# Patient Record
Sex: Female | Born: 1965 | Race: Black or African American | Hispanic: No | Marital: Single | Smoking: Never smoker
Health system: Southern US, Community
[De-identification: ages and names within clinical notes are randomized; demographics above are authoritative.]

## PROBLEM LIST (undated history)

## (undated) DIAGNOSIS — T7840XA Allergy, unspecified, initial encounter: Secondary | ICD-10-CM

## (undated) DIAGNOSIS — E119 Type 2 diabetes mellitus without complications: Secondary | ICD-10-CM

## (undated) DIAGNOSIS — G4730 Sleep apnea, unspecified: Secondary | ICD-10-CM

## (undated) DIAGNOSIS — I429 Cardiomyopathy, unspecified: Secondary | ICD-10-CM

## (undated) DIAGNOSIS — C801 Malignant (primary) neoplasm, unspecified: Secondary | ICD-10-CM

## (undated) DIAGNOSIS — G8929 Other chronic pain: Secondary | ICD-10-CM

## (undated) DIAGNOSIS — Z5189 Encounter for other specified aftercare: Secondary | ICD-10-CM

## (undated) DIAGNOSIS — G43909 Migraine, unspecified, not intractable, without status migrainosus: Secondary | ICD-10-CM

## (undated) DIAGNOSIS — R011 Cardiac murmur, unspecified: Secondary | ICD-10-CM

## (undated) DIAGNOSIS — K219 Gastro-esophageal reflux disease without esophagitis: Secondary | ICD-10-CM

## (undated) DIAGNOSIS — R001 Bradycardia, unspecified: Secondary | ICD-10-CM

## (undated) DIAGNOSIS — I509 Heart failure, unspecified: Secondary | ICD-10-CM

## (undated) DIAGNOSIS — D649 Anemia, unspecified: Secondary | ICD-10-CM

## (undated) DIAGNOSIS — E785 Hyperlipidemia, unspecified: Secondary | ICD-10-CM

## (undated) HISTORY — DX: Malignant (primary) neoplasm, unspecified: C80.1

## (undated) HISTORY — PX: LAPAROSCOPY ABDOMEN DIAGNOSTIC: PRO50

## (undated) HISTORY — DX: Allergy, unspecified, initial encounter: T78.40XA

## (undated) HISTORY — DX: Encounter for other specified aftercare: Z51.89

## (undated) HISTORY — DX: Gastro-esophageal reflux disease without esophagitis: K21.9

## (undated) HISTORY — PX: LEEP: SHX91

## (undated) HISTORY — DX: Other chronic pain: G89.29

## (undated) HISTORY — DX: Cardiac murmur, unspecified: R01.1

## (undated) HISTORY — DX: Anemia, unspecified: D64.9

## (undated) HISTORY — PX: HERNIA REPAIR: SHX51

## (undated) HISTORY — DX: Type 2 diabetes mellitus without complications: E11.9

## (undated) HISTORY — DX: Hyperlipidemia, unspecified: E78.5

---

## 1997-11-03 HISTORY — PX: TUBAL LIGATION: SHX77

## 2006-03-24 DIAGNOSIS — I341 Nonrheumatic mitral (valve) prolapse: Secondary | ICD-10-CM | POA: Insufficient documentation

## 2008-02-07 DIAGNOSIS — E66813 Obesity, class 3: Secondary | ICD-10-CM | POA: Insufficient documentation

## 2010-09-20 DIAGNOSIS — Z9189 Other specified personal risk factors, not elsewhere classified: Secondary | ICD-10-CM | POA: Insufficient documentation

## 2010-09-20 DIAGNOSIS — G43009 Migraine without aura, not intractable, without status migrainosus: Secondary | ICD-10-CM | POA: Insufficient documentation

## 2011-03-14 DIAGNOSIS — D75839 Thrombocytosis, unspecified: Secondary | ICD-10-CM | POA: Insufficient documentation

## 2011-03-14 DIAGNOSIS — D473 Essential (hemorrhagic) thrombocythemia: Secondary | ICD-10-CM | POA: Insufficient documentation

## 2011-06-10 DIAGNOSIS — G4733 Obstructive sleep apnea (adult) (pediatric): Secondary | ICD-10-CM | POA: Insufficient documentation

## 2011-06-10 DIAGNOSIS — Z9989 Dependence on other enabling machines and devices: Secondary | ICD-10-CM | POA: Insufficient documentation

## 2012-12-19 DIAGNOSIS — I5030 Unspecified diastolic (congestive) heart failure: Secondary | ICD-10-CM | POA: Insufficient documentation

## 2014-08-10 DIAGNOSIS — G8929 Other chronic pain: Secondary | ICD-10-CM | POA: Insufficient documentation

## 2014-08-10 DIAGNOSIS — M25512 Pain in left shoulder: Secondary | ICD-10-CM

## 2014-11-03 HISTORY — PX: PACEMAKER IMPLANT: EP1218

## 2014-11-03 HISTORY — PX: ABDOMINAL HYSTERECTOMY: SHX81

## 2015-05-23 DIAGNOSIS — I421 Obstructive hypertrophic cardiomyopathy: Secondary | ICD-10-CM

## 2015-05-23 DIAGNOSIS — I422 Other hypertrophic cardiomyopathy: Secondary | ICD-10-CM | POA: Insufficient documentation

## 2015-10-16 DIAGNOSIS — Z90710 Acquired absence of both cervix and uterus: Secondary | ICD-10-CM | POA: Insufficient documentation

## 2016-04-22 DIAGNOSIS — F32A Depression, unspecified: Secondary | ICD-10-CM | POA: Insufficient documentation

## 2016-04-22 DIAGNOSIS — F329 Major depressive disorder, single episode, unspecified: Secondary | ICD-10-CM | POA: Insufficient documentation

## 2016-06-20 DIAGNOSIS — Z950 Presence of cardiac pacemaker: Secondary | ICD-10-CM | POA: Insufficient documentation

## 2016-07-28 DIAGNOSIS — R7303 Prediabetes: Secondary | ICD-10-CM | POA: Insufficient documentation

## 2018-04-02 ENCOUNTER — Ambulatory Visit (HOSPITAL_COMMUNITY)
Admission: EM | Admit: 2018-04-02 | Discharge: 2018-04-02 | Disposition: A | Discharge status: Home / Self Care | Payer: Medicare Other | Attending: Family Medicine | Admitting: Family Medicine

## 2018-04-02 ENCOUNTER — Encounter (HOSPITAL_COMMUNITY): Payer: Self-pay | Admitting: Family Medicine

## 2018-04-02 DIAGNOSIS — M25512 Pain in left shoulder: Secondary | ICD-10-CM | POA: Diagnosis not present

## 2018-04-02 DIAGNOSIS — G8929 Other chronic pain: Secondary | ICD-10-CM | POA: Diagnosis not present

## 2018-04-02 DIAGNOSIS — M545 Low back pain, unspecified: Secondary | ICD-10-CM

## 2018-04-02 HISTORY — DX: Cardiomyopathy, unspecified: I42.9

## 2018-04-02 HISTORY — DX: Migraine, unspecified, not intractable, without status migrainosus: G43.909

## 2018-04-02 HISTORY — DX: Sleep apnea, unspecified: G47.30

## 2018-04-02 HISTORY — DX: Bradycardia, unspecified: R00.1

## 2018-04-02 HISTORY — DX: Heart failure, unspecified: I50.9

## 2018-04-02 MED ORDER — DICLOFENAC SODIUM 75 MG PO TBEC: 75.0000 mg | DELAYED_RELEASE_TABLET | Freq: Two times a day (BID) | ORAL | 0 refills | Status: DC

## 2018-04-02 MED ORDER — METHOCARBAMOL 750 MG PO TABS: 750.0000 mg | ORAL_TABLET | Freq: Four times a day (QID) | ORAL | 0 refills | Status: DC

## 2018-04-02 MED ORDER — PREDNISONE 50 MG PO TABS: 50.0000 mg | ORAL_TABLET | Freq: Every day | ORAL | 0 refills | Status: AC

## 2018-04-02 NOTE — Discharge Instructions (Signed)
Please begin diclofenac twice daily, this is an anti-inflammatory and should help with your pain  Please take prednisone daily for the next 5 days with breakfast, this is a steroid and should also help with inflammation in your shoulder and back  Please use Robaxin muscle relaxer as needed to help with back pain.  Please follow-up with orthopedics

## 2018-04-02 NOTE — ED Triage Notes (Signed)
Pt with hx of chronic left shoulder pain here for worseing left shoulder pain x 1 week with radiation into arm and hand. She is also having mid to lower back pain and left leg pain. She has been taking tylenol without relief.

## 2018-04-02 NOTE — ED Provider Notes (Signed)
[place]    CSN: [id number] Arrival date & time: 04/02/18  1533     History   Chief Complaint Chief Complaint  Patient presents with  . Arm Pain  . Leg Pain  . Back Pain    HPI Sophia Wilkinson is a 52 y.o. female history of CHF, cardiomyopathy, sleep apnea presenting today for evaluation of left shoulder pain as well as left lower back pain that is radiating into her left leg.  Patient states that she has had chronic left shoulder pain as she is previously had a rotator cuff tear which she did not have operatively repaired.  She has tried physical therapy which did not provide her with much improvement.  She states that over the past week her pain has worsened and has also developed some left lower back pain.  Taken Tylenol without relief.  Denies numbness or tingling.  Denies any new injury or any new increase in activity.  Denies loss of bowel or bladder control, denies saddle anesthesia.  HPI  Past Medical History:  Diagnosis Date  . Bradycardia   . Cardiomyopathy ([place])   . CHF (congestive heart failure) ([place])   . Migraines   . Sleep apnea     There are no active problems to display for this patient.   Past Surgical History:  Procedure Laterality Date  . ABDOMINAL HYSTERECTOMY    . HERNIA REPAIR    . PACEMAKER IMPLANT    . TUBAL LIGATION      OB History   None      Home Medications    Prior to Admission medications   Medication Sig Start Date End Date Taking? Authorizing Provider  ALPRAZolam Duanne Moron) 0.25 MG tablet Take 0.25 mg by mouth at bedtime as needed for anxiety.   Yes [provider]  candesartan (ATACAND) 4 MG tablet Take 2 mg by mouth daily.   Yes [provider]  fluticasone (FLONASE) 50 MCG/ACT nasal spray Place into both nostrils daily.   Yes [provider]  loratadine (CLARITIN) 10 MG tablet Take 10 mg by mouth daily.   Yes [provider]  metoprolol succinate (TOPROL-XL) 25 MG 24 hr tablet  Take 75 mg by mouth daily.    Yes [provider]  potassium chloride (K-DUR,KLOR-CON) 10 MEQ tablet Take 10 mEq by mouth 2 (two) times daily.   Yes [provider]  spironolactone (ALDACTONE) 25 MG tablet Take 12.5 mg by mouth daily.   Yes [provider]  torsemide (DEMADEX) 10 MG tablet Take 10 mg by mouth daily.   Yes [provider]  diclofenac (VOLTAREN) 75 MG EC tablet Take 1 tablet (75 mg total) by mouth 2 (two) times daily. 04/02/18   Surie Suchocki C, PA-C  methocarbamol (ROBAXIN-750) 750 MG tablet Take 1 tablet (750 mg total) by mouth 4 (four) times daily. 04/02/18   Elyana Grabski C, PA-C  predniSONE (DELTASONE) 50 MG tablet Take 1 tablet (50 mg total) by mouth daily for 5 days. 04/02/18 04/07/18  Ryonna Cimini, Elesa Hacker, PA-C    Family History History reviewed. No pertinent family history.  Social History Social History   Tobacco Use  . Smoking status: Never Smoker  . Smokeless tobacco: Former Network engineer Use Topics  . Alcohol use: Not on file  . Drug use: Not on file     Allergies   Patient has no known allergies.   Review of Systems Review of Systems  Constitutional: Negative for activity change and  appetite change.  Eyes: Negative for pain and visual disturbance.  Respiratory: Negative for shortness of breath.   Cardiovascular: Negative for chest pain.  Gastrointestinal: Negative for abdominal pain, nausea and vomiting.  Musculoskeletal: Positive for arthralgias, back pain and myalgias. Negative for gait problem, neck pain and neck stiffness.  Skin: Negative for color change and wound.  Neurological: Negative for dizziness, seizures, syncope, weakness, light-headedness, numbness and headaches.     Physical Exam Triage Vital Signs ED Triage Vitals  Enc Vitals Group     BP 04/02/18 1621 112/64     Pulse Rate 04/02/18 1621 61     Resp 04/02/18 1621 18     Temp --      Temp src --      SpO2 04/02/18 1621 98 %     Weight --       Height --      Head Circumference --      Peak Flow --      Pain Score 04/02/18 1618 8     Pain Loc --      Pain Edu? --      Excl. in [place]? --    No data found.  Updated Vital Signs BP 112/64   Pulse 61   Resp 18   SpO2 98%   Visual Acuity Right Eye Distance:   Left Eye Distance:   Bilateral Distance:    Right Eye Near:   Left Eye Near:    Bilateral Near:     Physical Exam  Constitutional: She appears well-developed and well-nourished. No distress.  Overweight  HENT:  Head: Normocephalic and atraumatic.  Eyes: Conjunctivae are normal.  Neck: Neck supple.  Cardiovascular: Normal rate.  Pulmonary/Chest: Effort normal. No respiratory distress.  Musculoskeletal: She exhibits no edema.  Patient able to ambulate from chair to exam table without assistance and with minimal abnormality  No obvious deformity or swelling to left shoulder, patient unable to perform internal rotation.  Limited active range of motion beyond 90 degrees to to pain, slightly increased range of motion with passive, but patient does resist beyond 90 degrees.  Weakness associated with empty can, [place], resisted external rotation.  Nontender to palpation of cervical, thoracic and lumbar spine, tenderness to palpation of left lateral lumbar musculature, negative straight leg raise.  Neurological: She is alert.  Skin: Skin is warm and dry.  Psychiatric: She has a normal mood and affect.  Nursing note and vitals reviewed.    UC Treatments / Results  Labs (all labs ordered are listed, but only abnormal results are displayed) Labs Reviewed - No data to display  EKG None  Radiology No results found.  Procedures Procedures (including critical care time)  Medications Ordered in UC Medications - No data to display  Initial Impression / Assessment and Plan / UC Course  I have reviewed the triage vital signs and the nursing notes.  Pertinent labs & imaging results that were available during  my care of the patient were reviewed by me and considered in my medical decision making (see chart for details).     Patient likely with acute on chronic shoulder pain, secondary to rotator cuff tear.  Back pain also appears muscular, no red flags.  Will provide diclofenac, prednisone as well as Robaxin.  And follow-up with orthopedics.Discussed strict return precautions. Patient verbalized understanding and is agreeable with plan.  Final Clinical Impressions(s) / UC Diagnoses   Final diagnoses:  Chronic left shoulder pain  Acute right-sided low back  pain without sciatica     Discharge Instructions     Please begin diclofenac twice daily, this is an anti-inflammatory and should help with your pain  Please take prednisone daily for the next 5 days with breakfast, this is a steroid and should also help with inflammation in your shoulder and back  Please use Robaxin muscle relaxer as needed to help with back pain.  Please follow-up with orthopedics   ED Prescriptions    Medication Sig Dispense Auth. Provider   diclofenac (VOLTAREN) 75 MG EC tablet Take 1 tablet (75 mg total) by mouth 2 (two) times daily. 30 tablet Randilyn Foisy C, PA-C   predniSONE (DELTASONE) 50 MG tablet Take 1 tablet (50 mg total) by mouth daily for 5 days. 5 tablet Cristofer Yaffe C, PA-C   methocarbamol (ROBAXIN-750) 750 MG tablet Take 1 tablet (750 mg total) by mouth 4 (four) times daily. 30 tablet Manuel Lawhead, [place] C, PA-C     Controlled Substance Prescriptions [place] Controlled Substance Registry consulted? Not Applicable   Janith Lima, [place] 04/02/18 2040

## 2018-04-06 ENCOUNTER — Encounter: Payer: Self-pay | Admitting: Family Medicine

## 2018-04-13 NOTE — Progress Notes (Signed)
Subjective:   Patient ID: Sophia Wilkinson    DOB: 09-03-1966, 52 y.o. female   MRN: [id number]  Lilya Smitherman is a 52 y.o. female with a history of sleep apnea, migraines, CHF, chronic pain here for   Establish Care - [place] and medications reviewed - CHF - candesartan, metoprolol, spironolactone, Kdur (dose dependent on torsemide dose that day), torsemide 10-20mg . Sees cardiologist in [place], needs to establish down here. 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 HCM (2011). No stents, no MI that she knows of. - allergies - claritin - sleep apnea with CPAP, needs supplies. Last sleep study 2012. - migraines, no chronic meds. - Surg Hx - hysterectomy 2016, BTL, pacemaker 2016, hernia repair x4, diagnostic laps - lives with oldest daughter, granddaughter, grandson. On disability, does not work outside the home. No alcohol, never smoker, no ID use. - needs pacemaker interogated in August - OB/GYN hx - G7P5, all vaginal deliveries.  - seen in the ED 5/31 for L shoulder and L lower back pain. Chronic. H/o rotator cuff tear with no surgical fixation. Received diclofenac, prednisone, and robaxin. F/u with ortho.  Review of Systems:  Per HPI.   [place], medications and smoking status reviewed.  Objective:   BP 110/70   Pulse 61   Temp 98.7 F (37.1 C)   Ht 5\' 6"  (1.676 m)   Wt 276 lb 3.2 oz (125.3 kg)   SpO2 99%   BMI 44.58 kg/m  Vitals and nursing note reviewed.  General: well nourished, well developed, in no acute distress with non-toxic appearance HEENT: normocephalic, atraumatic, moist mucous membranes.  Neck: Acanthosis nigricans  CV: regular rate and rhythm without murmurs, rubs, or gallops, 1+ pitting lower extremity edema Lungs: clear to auscultation bilaterally with normal work of breathing Skin: warm, dry, no rashes or lesions Extremities: warm and well perfused, normal tone MSK: ROM grossly intact, strength intact, gait normal Neuro: Alert and oriented, speech  normal  Assessment & Plan:   Chronic congestive heart failure ([place]) Well managed by cardiologist in [place]. Needs to establish with local cardiologist since moving a few months ago. Will place referral. Will also need to see EP for her pacemaker. Will check BMP today. Refills given.  Obesity Contributing to multiple comorbidities including sleep apnea, CHF, multiple abdominal hernias. Consider nutrition referral at subsequent visit. Acanthosis nigricans visible on exam today, will check A1c.   Healthcare Maintenance Referral placed for colonoscopy. Mammogram recently done in [place], will obtain records. Advised patient to make appointment for well woman visit to update pap smear. Obtain Hep C and HIV screening today.  Orders Placed This Encounter  Procedures  . Basic Metabolic Panel  . Hepatitis c antibody (reflex)  . Ambulatory referral to Cardiology    Referral Priority:   Routine    Referral Type:   Consultation    Referral Reason:   Specialty Services Required    Requested Specialty:   Cardiology    Number of Visits Requested:   1  . Ambulatory referral to Gastroenterology    Referral Priority:   Routine    Referral Type:   Consultation    Referral Reason:   Specialty Services Required    Number of Visits Requested:   1  . POCT glycosylated hemoglobin (Hb A1C)  . Split night study    Standing Status:   Future    Standing Expiration Date:   04/15/2019    Order Specific Question:   Where should this test be performed:    Answer:  [facility]   Meds ordered this encounter  Medications  . candesartan (ATACAND) 4 MG tablet    Sig: Take 0.5 tablets (2 mg total) by mouth daily.    Dispense:  45 tablet    Refill:  1  . loratadine (CLARITIN) 10 MG tablet    Sig: Take 1 tablet (10 mg total) by mouth daily.    Dispense:  90 tablet    Refill:  1  . metoprolol succinate (TOPROL-XL) 25 MG 24 hr tablet    Sig: Take 3 tablets (75 mg total) by mouth daily.     Dispense:  270 tablet    Refill:  1  . potassium chloride (K-DUR,KLOR-CON) 10 MEQ tablet    Sig: Take 1 tablet (10 mEq total) by mouth 2 (two) times daily.    Dispense:  180 tablet    Refill:  0  . spironolactone (ALDACTONE) 25 MG tablet    Sig: Take 0.5 tablets (12.5 mg total) by mouth daily.    Dispense:  45 tablet    Refill:  1  . torsemide (DEMADEX) 10 MG tablet    Sig: Take 1 tablet (10 mg total) by mouth daily.    Dispense:  90 tablet    Refill:  [place], DO PGY-1, [facility] 04/14/2018 2:57 PM

## 2018-04-14 ENCOUNTER — Encounter: Payer: Self-pay | Admitting: Family Medicine

## 2018-04-14 ENCOUNTER — Ambulatory Visit (INDEPENDENT_AMBULATORY_CARE_PROVIDER_SITE_OTHER): Payer: Medicare Other | Admitting: Family Medicine

## 2018-04-14 VITALS — BP 110/70 | HR 61 | Temp 98.7°F | Ht 66.0 in | Wt 276.2 lb

## 2018-04-14 DIAGNOSIS — Z114 Encounter for screening for human immunodeficiency virus [HIV]: Secondary | ICD-10-CM

## 2018-04-14 DIAGNOSIS — E669 Obesity, unspecified: Secondary | ICD-10-CM | POA: Diagnosis not present

## 2018-04-14 DIAGNOSIS — R739 Hyperglycemia, unspecified: Secondary | ICD-10-CM | POA: Diagnosis not present

## 2018-04-14 DIAGNOSIS — Z0000 Encounter for general adult medical examination without abnormal findings: Secondary | ICD-10-CM

## 2018-04-14 DIAGNOSIS — I509 Heart failure, unspecified: Secondary | ICD-10-CM | POA: Diagnosis not present

## 2018-04-14 LAB — POCT GLYCOSYLATED HEMOGLOBIN (HGB A1C): HEMOGLOBIN A1C: 6.1 % — AB (ref 4.0–5.6)

## 2018-04-14 MED ORDER — CANDESARTAN CILEXETIL 4 MG PO TABS: 2.0000 mg | ORAL_TABLET | Freq: Every day | ORAL | 1 refills | Status: DC

## 2018-04-14 MED ORDER — METOPROLOL SUCCINATE ER 25 MG PO TB24: 75.0000 mg | ORAL_TABLET | Freq: Every day | ORAL | 1 refills | Status: DC

## 2018-04-14 MED ORDER — TORSEMIDE 10 MG PO TABS: 10.0000 mg | ORAL_TABLET | Freq: Every day | ORAL | 1 refills | Status: DC

## 2018-04-14 MED ORDER — SPIRONOLACTONE 25 MG PO TABS: 12.5000 mg | ORAL_TABLET | Freq: Every day | ORAL | 1 refills | Status: DC

## 2018-04-14 MED ORDER — POTASSIUM CHLORIDE CRYS ER 10 MEQ PO TBCR: 10.0000 meq | EXTENDED_RELEASE_TABLET | Freq: Two times a day (BID) | ORAL | 0 refills | Status: DC

## 2018-04-14 MED ORDER — LORATADINE 10 MG PO TABS: 10.0000 mg | ORAL_TABLET | Freq: Every day | ORAL | 1 refills | Status: DC

## 2018-04-14 NOTE — Patient Instructions (Addendum)
It was great to see you!  Our plans for today: - referral to Cardiology  - refill medications - referral for colonoscopy - obtain records for mammogram - make appointment for well woman exam  We are checking some labs today, we will call you or send you a letter if they are abnormal.   Take care and seek immediate care sooner if you develop any concerns.   Dr. [facility]

## 2018-04-14 NOTE — Assessment & Plan Note (Signed)
Contributing to multiple comorbidities including sleep apnea, CHF, multiple abdominal hernias. Consider nutrition referral at subsequent visit. Acanthosis nigricans visible on exam today, will check A1c.

## 2018-04-14 NOTE — Assessment & Plan Note (Addendum)
Well managed by cardiologist in [place]. Needs to establish with local cardiologist since moving a few months ago. Will place referral. Will also need to see EP for her pacemaker. Will check BMP today. Refills given.

## 2018-04-15 LAB — BASIC METABOLIC PANEL
BUN/Creatinine Ratio: 14 (ref 9–23)
BUN: 11 mg/dL (ref 6–24)
CALCIUM: 9.4 mg/dL (ref 8.7–10.2)
CHLORIDE: 103 mmol/L (ref 96–106)
CO2: 23 mmol/L (ref 20–29)
Creatinine, Ser: 0.8 mg/dL (ref 0.57–1.00)
GFR calc non Af Amer: 86 mL/min/{1.73_m2} (ref >59)
GFR, EST AFRICAN AMERICAN: 99 mL/min/{1.73_m2} (ref >59)
Glucose: 92 mg/dL (ref 65–99)
POTASSIUM: 4.3 mmol/L (ref 3.5–5.2)
SODIUM: 140 mmol/L (ref 134–144)

## 2018-04-15 LAB — HCV COMMENT:

## 2018-04-15 LAB — HIV ANTIBODY (ROUTINE TESTING W REFLEX): HIV SCREEN 4TH GENERATION: NONREACTIVE

## 2018-04-15 LAB — HEPATITIS C ANTIBODY (REFLEX): HCV Ab: <0.1 s/co ratio (ref 0.0–0.9)

## 2018-04-16 ENCOUNTER — Encounter: Payer: Self-pay | Admitting: Gastroenterology

## 2018-04-28 ENCOUNTER — Ambulatory Visit: Payer: Medicare Other | Admitting: Family Medicine

## 2018-05-03 DIAGNOSIS — M25512 Pain in left shoulder: Secondary | ICD-10-CM | POA: Insufficient documentation

## 2018-05-11 ENCOUNTER — Telehealth: Payer: Self-pay | Admitting: Family Medicine

## 2018-05-11 ENCOUNTER — Other Ambulatory Visit: Payer: Self-pay | Admitting: Family Medicine

## 2018-05-11 DIAGNOSIS — M25512 Pain in left shoulder: Principal | ICD-10-CM

## 2018-05-11 DIAGNOSIS — G8929 Other chronic pain: Secondary | ICD-10-CM

## 2018-05-11 NOTE — Telephone Encounter (Signed)
Referral placed.

## 2018-05-11 NOTE — Telephone Encounter (Signed)
Contacted pt to see about her PT questions. Pt stated she was was seen by ortho for left shoulder pain and was referred to PT. Pts insurance needs this PT referral to come from her pcp, not her ortho doctor. Pt is new to the area and doesn't have a preference on PT. Pt will have a large copay if not referred by her PCP.

## 2018-05-11 NOTE — Telephone Encounter (Signed)
Patient came to office and has questions in regards to physical therapy. Please give patient a call to discuss [phone]

## 2018-05-14 ENCOUNTER — Ambulatory Visit: Payer: Medicare Other | Attending: Family Medicine | Admitting: Physical Therapy

## 2018-05-14 ENCOUNTER — Ambulatory Visit (HOSPITAL_BASED_OUTPATIENT_CLINIC_OR_DEPARTMENT_OTHER): Payer: Medicare Other | Attending: Family Medicine | Admitting: Internal Medicine

## 2018-05-14 ENCOUNTER — Other Ambulatory Visit: Payer: Self-pay

## 2018-05-14 VITALS — Ht 66.0 in | Wt 277.0 lb

## 2018-05-14 DIAGNOSIS — E669 Obesity, unspecified: Secondary | ICD-10-CM | POA: Diagnosis not present

## 2018-05-14 DIAGNOSIS — Z0000 Encounter for general adult medical examination without abnormal findings: Secondary | ICD-10-CM | POA: Diagnosis present

## 2018-05-14 DIAGNOSIS — M25512 Pain in left shoulder: Secondary | ICD-10-CM | POA: Diagnosis not present

## 2018-05-14 DIAGNOSIS — Z6841 Body Mass Index (BMI) 40.0 and over, adult: Secondary | ICD-10-CM | POA: Diagnosis not present

## 2018-05-14 DIAGNOSIS — G4733 Obstructive sleep apnea (adult) (pediatric): Secondary | ICD-10-CM | POA: Insufficient documentation

## 2018-05-14 DIAGNOSIS — G8929 Other chronic pain: Secondary | ICD-10-CM | POA: Diagnosis present

## 2018-05-14 DIAGNOSIS — I509 Heart failure, unspecified: Secondary | ICD-10-CM | POA: Diagnosis not present

## 2018-05-14 DIAGNOSIS — M6281 Muscle weakness (generalized): Secondary | ICD-10-CM | POA: Diagnosis present

## 2018-05-14 DIAGNOSIS — M25612 Stiffness of left shoulder, not elsewhere classified: Secondary | ICD-10-CM | POA: Diagnosis present

## 2018-05-14 NOTE — Therapy (Addendum)
[place], [place], [address] Phone: [phone]   Fax:  [phone]  Physical Therapy Evaluation/Addended Discharge   Patient Details  Name: Sophia Wilkinson MRN: [id number] Date of Birth: 05/23/1966 Referring Provider: Rory Percy, DO , Dr. Onnie Graham    Encounter Date: 05/14/2018  PT End of Session - 05/14/18 0959    Visit Number  1    Number of Visits  12    Date for PT Re-Evaluation  06/25/18    PT Start Time  0920    PT Stop Time  1005    PT Time Calculation (min)  45 min    Activity Tolerance  Patient tolerated treatment well    Behavior During Therapy  [facility] for tasks assessed/performed       Past Medical History:  Diagnosis Date  . Bradycardia   . Cardiomyopathy ([place])   . CHF (congestive heart failure) ([place])   . Chronic pain   . Migraines   . Sleep apnea     Past Surgical History:  Procedure Laterality Date  . ABDOMINAL HYSTERECTOMY  2016  . HERNIA REPAIR    . PACEMAKER IMPLANT  2016  . TUBAL LIGATION  1999    There were no vitals filed for this visit.   Subjective Assessment - 05/14/18 0922    Subjective  Pt recently moved to [place] from [place].  She had treatment last yr, was scheduled for surgery but she cancelled it due to her discomfort. with that particular [place].  She saw Dr. Onnie Graham and received cortisone shot, pain is about 80% better.  She has difficulty with getting comfortable at times.  She has some mod to mild degree of difficulty with use of L UE and ADLs, mobility.  Arm gets numb to fingertips, feels weak.      Pertinent History  Pacemaker 2016, Cardiomyopathy, CHF, heart murmur, sleep apnea, migraines     Limitations  Lifting;House hold activities    Diagnostic tests  XR done     Patient Stated Goals  Patient would like to reduce pain     Currently in Pain?  Yes    Pain Score  6  a good day for her     Pain Location  Shoulder    Pain Orientation  Left;Lateral    Pain  Descriptors / Indicators  Nagging    Pain Type  Chronic pain    Pain Radiating Towards  lower arm, hand     Pain Onset  More than a month ago    Pain Frequency  Constant    Aggravating Factors   use of LE UE    Pain Relieving Factors  meds/Tylenol, cortisone shot , heat     Effect of Pain on Daily Activities  pain with ADLs    Multiple Pain Sites  No         [facility] PT Assessment - 05/14/18 0001      Assessment   Medical Diagnosis  L shoulder pain     Referring Provider  Rory Percy, DO , Dr. Onnie Graham     Onset Date/Surgical Date  -- >3 yrs     Hand Dominance  Right    Next [place] Visit  06/04/18    Prior Therapy  Yes in [place]      Precautions   Precautions  None;ICD/Pacemaker      Restrictions   Weight Bearing Restrictions  No      Balance Screen   Has the patient  fallen in the past 6 months  No      Home Social worker  Private residence    Living Arrangements  Children;Other relatives    Type of Home  House      Prior Function   Level of Independence  Independent    Vocation  On disability    Leisure  hanging out with grandkids       Cognition   Overall Cognitive Status  Within Functional Limits for tasks assessed      Observation/Other Assessments   Focus on Therapeutic Outcomes (FOTO)   49%      Sensation   Light Touch  Appears Intact      Posture/Postural Control   Posture/Postural Control  Postural limitations    Postural Limitations  Rounded Shoulders;Forward head      AROM   Right Shoulder Flexion  150 Degrees    Right Shoulder ABduction  120 Degrees    Right Shoulder Internal Rotation  -- FR lumbar    Right Shoulder External Rotation  -- back of neck     Left Shoulder Flexion  88 Degrees    Left Shoulder ABduction  70 Degrees    Left Shoulder Internal Rotation  -- FR L lateral hip     Left Shoulder External Rotation  -- FR to shoulder      PROM   Overall PROM Comments  pain end range ER and IR but full PROM.  Pain flexion 130 deg and  abduction >100 deg       Strength   Left Shoulder Flexion  3+/5    Left Shoulder ABduction  3+/5    Left Shoulder Internal Rotation  4/5    Left Shoulder External Rotation  4/5                Objective measurements completed on examination: See above findings.      [facility] Adult PT Treatment/Exercise - 05/14/18 0001      Self-Care   Self-Care  RICE;Posture;Heat/Ice Application;Other Self-Care Comments    Posture  impingement    Heat/Ice Application  ice for inflammation    Other Self-Care Comments   HEP      Shoulder Exercises: Seated   External Rotation  Strengthening;Both;12 reps    Theraband Level (Shoulder External Rotation)  Level 1 (Yellow)      Shoulder Exercises: Stretch   Table Stretch - Flexion  5 reps;10 seconds    Table Stretch - Abduction  5 reps;10 seconds    Table Stretch - External Rotation  5 reps;10 seconds      Cryotherapy   Number Minutes Cryotherapy  10 Minutes    Cryotherapy Location  Shoulder    Type of Cryotherapy  Ice pack             PT Education - 05/14/18 0958    Education Details  PT/POC, HEP, anatomy, impingement, RICE    Person(s) Educated  Patient    Methods  Explanation;Demonstration;Handout    Comprehension  Verbalized understanding;Returned demonstration        PT Long Term Goals - 05/14/18 1011      PT LONG TERM GOAL #1   Title  Pt will be I with HEP for Lt UE ROM, strength and pain , posture     Time  6    Period  Weeks    Status  New    Target Date  06/25/18      PT LONG TERM GOAL #  2   Title  Pt will be able to raise L UE to 120 deg with min pain for increased ability to reach , obtain items in her home.     Time  6    Period  Weeks    Status  New    Target Date  06/25/18      PT LONG TERM GOAL #3   Title  Pt will be able to lift her 51 yr old grandson with both UEs with min to no pain increase     Time  6    Period  Weeks    Status  New    Target Date  06/25/18      PT LONG TERM GOAL #4   Title   Pt will score <40% limited on FOTO to deom improved functional use of L UE    Time  6    Period  Weeks    Status  New    Target Date  06/25/18      PT LONG TERM GOAL #5   Title  Pt will demo increased shoulder strength in flexion and abduction to 4+/5 (at least) for improved use of L UE with lifting, carrying objects.     Time  6    Period  Weeks    Status  New    Target Date  06/25/18             Plan - 05/14/18 1000    Clinical Impression Statement  Patient presents for low complexity eval of LUE pain due to impingement syndrome.  She has decreased AROM, pain, increased muscle tension in L UE  and weakness in arm.  She has had this pain for >3 yrs but now that she is feeling less pain from the injection this is the ideal time for her to improve her function.  She only had 2-3 PT visits in her home state of [place].      History and Personal Factors relevant to plan of care:  cardiac issues, pacemaker     Clinical Presentation  Stable    Clinical Presentation due to:  chronic ongoing     Clinical Decision Making  Low    Rehab Potential  Good    PT Frequency  2x / week    PT Duration  6 weeks    PT Treatment/Interventions  ADLs/Self Care Home Management;Functional mobility training;Neuromuscular re-education;Taping;Cryotherapy;Moist Heat;Ultrasound;Therapeutic exercise;Manual techniques;Dry needling;Passive range of motion;Therapeutic activities;Iontophoresis 60m/ml Dexamethasone    PT Next Visit Plan  ROM, strength as tolerated, manual /STW     PT Home Exercise Plan  AAROM table and wall slides , external rotation  yellow band     Consulted and Agree with Plan of Care  Patient       Patient will benefit from skilled therapeutic intervention in order to improve the following deficits and impairments:  Pain, Postural dysfunction, Impaired UE functional use, Impaired flexibility, Increased fascial restricitons, Decreased strength, Obesity, Decreased range of motion, Decreased mobility,  Impaired sensation  Visit Diagnosis: Chronic left shoulder pain  Muscle weakness (generalized)  Stiffness of left shoulder, not elsewhere classified     Problem List Patient Active Problem List   Diagnosis Date Noted  . Chronic congestive heart failure ([place] 04/14/2018  . Obesity 04/14/2018    Damarius Karnes 05/14/2018, 11:21 AM  [facility]GHiram N[place] [address]Phone: [phone]  Fax:  [phone] Name: AKyndal HeringerMRN: [id number] of  Birth: [birth year]   Raeford Razor, PT 05/14/18 11:21 AM Phone: [phone] Fax: [phone]  PHYSICAL THERAPY DISCHARGE SUMMARY  Visits from Start of Care: 1  Current functional level related to goals / functional outcomes: See above    Remaining deficits: Unknown   Education / Equipment: On eval  Plan: Patient agrees to discharge.  Patient goals were not met. Patient is being discharged due to not returning since the last visit.  ?????    Raeford Razor, PT 06/22/18 2:24 PM Phone: [phone] Fax: [phone]

## 2018-05-20 ENCOUNTER — Telehealth: Payer: Self-pay | Admitting: Family Medicine

## 2018-05-20 NOTE — Telephone Encounter (Signed)
Patient is scheduled tomorrow 7/19 for pap smear. History shows she has a hysterectomy. Called to clarify whether she still has a cervix. If her cervix was removed with the hysterectomy she does NOT need a pap smear. If her cervix is still in place, she will need a pap smear.

## 2018-05-20 NOTE — Progress Notes (Signed)
  Subjective:   Patient ID: Sophia Wilkinson    DOB: June 15, 1966, 52 y.o. female   MRN: [id number]  Sophia Wilkinson is a 52 y.o. female with a history of CHF, HOCM, migraine, sleep apnea, obesity here for well woman/preventative visit and annual GYN examination  Acute Concerns: None   Diet: only eats fish, chicken, [place]. Had previously cut out starch completely but does eat some when grandkids are around. Drinks mainly water.  Exercise: doing a lot of walking.  Keeps young grandchild and is very active with him.  Sexual/Birth History: G7P5, not currently sexually active   Birth Control: hysterectomy, cervix in situ  POA/Living Will: none  Review of Systems: Denies vaginal itching, burning, abnormal discharge, chest pain, SOB, changes in bowel or bladder habits.    PMH, PSH, Medications, Allergies, and FmHx reviewed and updated in EMR.  Social History: never smoker  Immunization:  Tdap/TD: 2016  Influenza: N/A  Pneumococcal: needs pneumovax PPSV23  Herpes Zoster: N/A  Cancer Screening:  Pap Smear: due  Mammogram: 11/2017 in [place], reports normal results.   Colonoscopy: referral placed  Dexa: N/A  Prediabetes - Last A1c: 6.1 - denies polydipsia, polyuria, feet trauma/wounds  OSA - had sleep study 7/12 - received face mask with supplies since last visit  Review of Systems:  Per HPI. Denies itching, burning. [place], medications and smoking status reviewed.  Objective:   BP 128/80   Pulse 62   Temp 98.5 F (36.9 C) (Oral)   Ht 5\' 6"  (1.676 m)   Wt 275 lb (124.7 kg)   SpO2 99%   BMI 44.39 kg/m  Vitals and nursing note reviewed.  General: pleasant obese female, in no acute distress with non-toxic appearance CV: regular rate and rhythm without murmurs, rubs, or gallops, no lower extremity edema Lungs: clear to auscultation bilaterally with normal work of breathing Abdomen: soft, obese abdomen, non-tender, no masses or organomegaly palpable, normoactive bowel  sounds GYN:  External genitalia within normal limits.  Vaginal mucosa pink, moist, normal rugae.  Nonfriable cervix without lesions, no discharge or bleeding noted on speculum exam. No cervical motion tenderness. No adnexal masses bilaterally.   Skin: warm, dry, no rashes or lesions Extremities: warm and well perfused, normal tone MSK: ROM grossly intact, strength intact, gait normal Neuro: Alert and oriented, speech normal  Assessment & Plan:   Prediabetes A1c 6.1 at last visit. Started Metformin 500mg  daily. Discussed diet and exercise for weight management and prevention of progression of diabetes. Follow up in 6 months for recheck A1c.  Severe obesity (BMI >= 40) (HCC) Patient actively trying to lose weight. Lost 2lbs since last visit. Discussed diet and exercise for weight management. Patient reports balanced diet although could consider nutrition referral in the future should progress stall. Suspect initiation of metformin will also be beneficial to overall weight loss.  Healthcare Maintenance H/o hysterectomy but with cervix in situ. Pap smear performed today. Pneumovax given today given CHF.  Orders Placed This Encounter  Procedures  . Pneumococcal polysaccharide vaccine 23-valent greater than or equal to 2yo subcutaneous/IM   Meds ordered this encounter  Medications  . metFORMIN (GLUCOPHAGE XR) 500 MG 24 hr tablet    Sig: Take 1 tablet (500 mg total) by mouth daily with breakfast.    Dispense:  90 tablet    Refill:  [place], DO PGY-2, [facility] 05/21/2018 5:54 PM

## 2018-05-21 ENCOUNTER — Ambulatory Visit (INDEPENDENT_AMBULATORY_CARE_PROVIDER_SITE_OTHER): Payer: Medicare Other | Admitting: Family Medicine

## 2018-05-21 ENCOUNTER — Other Ambulatory Visit: Payer: Self-pay

## 2018-05-21 ENCOUNTER — Encounter: Payer: Self-pay | Admitting: Family Medicine

## 2018-05-21 ENCOUNTER — Other Ambulatory Visit (HOSPITAL_COMMUNITY)
Admission: RE | Admit: 2018-05-21 | Discharge: 2018-05-21 | Disposition: A | Discharge status: Home / Self Care | Payer: Medicare Other | Source: Ambulatory Visit | Attending: Family Medicine | Admitting: Family Medicine

## 2018-05-21 VITALS — BP 128/80 | HR 62 | Temp 98.5°F | Ht 66.0 in | Wt 275.0 lb

## 2018-05-21 DIAGNOSIS — Z124 Encounter for screening for malignant neoplasm of cervix: Secondary | ICD-10-CM | POA: Insufficient documentation

## 2018-05-21 DIAGNOSIS — R7303 Prediabetes: Secondary | ICD-10-CM | POA: Diagnosis not present

## 2018-05-21 DIAGNOSIS — Z23 Encounter for immunization: Secondary | ICD-10-CM | POA: Diagnosis not present

## 2018-05-21 MED ORDER — METFORMIN HCL ER 500 MG PO TB24: 500.0000 mg | ORAL_TABLET | Freq: Every day | ORAL | 1 refills | Status: DC

## 2018-05-21 NOTE — Telephone Encounter (Signed)
On further chart review, patient had hysterectomy with cervix left in situ and should continue receiving pap smears. Will perform at appointment later today.

## 2018-05-21 NOTE — Assessment & Plan Note (Signed)
Patient actively trying to lose weight. Lost 2lbs since last visit. Discussed diet and exercise for weight management. Patient reports balanced diet although could consider nutrition referral in the future should progress stall. Suspect initiation of metformin will also be beneficial to overall weight loss.

## 2018-05-21 NOTE — Patient Instructions (Signed)
It was great to see you!  Our plans for today:  - We did your pap smear today, we will call you with these results. - We are starting Metformin today. - Congratulations on losing weight! Keep up the hard work! See below for further tips for losing weight and maintaining a healthy lifestyle.  Take care and seek immediate care sooner if you develop any concerns.   Dr. [facility]  Here is an example of what a healthy plate looks like:    ? Make half your plate fruits and vegetables.     ? Focus on whole fruits.     ? Vary your veggies.  ? Make half your grains whole grains. -     ? Look for the word "whole" at the beginning of the ingredients list    ? Some whole-grain ingredients include whole oats, whole-wheat flour,        whole-grain corn, whole-grain brown rice, and whole rye.  ? Move to low-fat and fat-free milk or yogurt.  ? Vary your protein routine. - Meat, fish, poultry (chicken, [place]), eggs, beans (kidney, pinto), dairy.  ? Drink and eat less sodium, saturated fat, and added sugars.  Look for opportunities to move your body throughout your day:  Never lie down when you can sit; never sit when you can stand; never stand when you can pace.  Moving your body throughout the day is just as important as the 30 or 60 minutes of exercise at the gym!  Get social Get active with your friends instead of going out to eat. Go for a hike, walk around the mall, or play an exercise-themed video game.   Move more at work Fit more activity into the workday. Stand during phone calls, use a printer farther from your desk, and get up to stretch each hour.    Do something new Develop a new skill to kick-start your motivation. Sign up for a class to learn how to [employer], surf, do tai chi, or play a sport.    Keep cool in the pool Don't like to sweat? Hit the local community pool for a swim, water polo, or water aerobics class to stay cool while exercising.    Stay on track Use a fitness tracker (FITBIT, Fitness Pal mobile app) to track your activity and provide motivation to reach your goals.

## 2018-05-21 NOTE — Assessment & Plan Note (Addendum)
A1c 6.1 at last visit. Started Metformin 500mg  daily. Discussed diet and exercise for weight management and prevention of progression of diabetes. Follow up in 6 months for recheck A1c.

## 2018-05-21 NOTE — Telephone Encounter (Signed)
Pt is not sure "what all was removed".  She said she had her notes from [facility] "linked" to [facility].  Advised I would send a message to MD, if she did not hear back then she should plan on coming in for appt. Ishmel Acevedo, Salome Spotted, CMA

## 2018-05-22 DIAGNOSIS — G4733 Obstructive sleep apnea (adult) (pediatric): Secondary | ICD-10-CM | POA: Diagnosis not present

## 2018-05-22 NOTE — Procedures (Signed)
     Patient Name: Sophia Wilkinson, Sophia Wilkinson Date: 05/14/2018 Gender: Female D.O.B: 1966-08-13 Age (years): 40 Referring Provider: Leeanne Rio Height (inches): 37 Interpreting Physician: Baird Lyons MD, ABSM Weight (lbs): 277 RPSGT: Baxter Flattery BMI: 45 MRN: [id number] Neck Size: 16.00  CLINICAL INFORMATION Sleep Study Type: NPSG  Indication for sleep study: Congestive Heart Failure, Fatigue, Obesity, Snoring  Epworth Sleepiness Score: 20  SLEEP STUDY TECHNIQUE As per the AASM Manual for the Scoring of Sleep and Associated Events v2.3 (April 2016) with a hypopnea requiring 4% desaturations.  The channels recorded and monitored were frontal, central and occipital EEG, electrooculogram (EOG), submentalis EMG (chin), nasal and oral airflow, thoracic and abdominal wall motion, anterior tibialis EMG, snore microphone, electrocardiogram, and pulse oximetry.  MEDICATIONS Medications self-administered by patient taken the night of the study : none reported  SLEEP ARCHITECTURE The study was initiated at 10:14:06 PM and ended at 4:46:28 AM.  Sleep onset time was 2.6 minutes and the sleep efficiency was 96.4%%. The total sleep time was 378.2 minutes.  Stage REM latency was 206.0 minutes.  The patient spent 2.0%% of the night in stage N1 sleep, 84.0%% in stage N2 sleep, 0.0%% in stage N3 and 14.01% in REM.  Alpha intrusion was absent.  Supine sleep was 0.00%.  RESPIRATORY PARAMETERS The overall apnea/hypopnea index (AHI) was 12.1 per hour. There were 20 total apneas, including 17 obstructive, 3 central and 0 mixed apneas. There were 56 hypopneas and 0 RERAs.  The AHI during Stage REM sleep was 35.1 per hour.  AHI while supine was N/A per hour.  The mean oxygen saturation was 94.4%. The minimum SpO2 during sleep was 83.0%.  loud snoring was noted during this study.  CARDIAC DATA The 2 lead EKG demonstrated sinus rhythm. The mean heart rate was 55.6 beats per minute.  Other EKG findings include: PVCs.  LEG MOVEMENT DATA The total PLMS were 0 with a resulting PLMS index of 0.0. Associated arousal with leg movement index was 0.0 .  IMPRESSIONS - Mild obstructive sleep apnea occurred during this study (AHI = 12.1/h). - Insufficient early events to meet protocol requirements for split CPAP titration. - No significant central sleep apnea occurred during this study (CAI = 0.5/h). - Mild oxygen desaturation was noted during this study (Min O2 = 83.0%). - The patient snored with loud snoring volume. - EKG findings include PVCs. - Clinically significant periodic limb movements did not occur during sleep. No significant associated arousals.  DIAGNOSIS - Obstructive Sleep Apnea (327.23 [G47.33 ICD-10])  RECOMMENDATIONS - Suggest CPAP titration sleep study or DME autopap. Other options would be based on clinical judgment. - Avoid alcohol, sedatives and other CNS depressants that may worsen sleep apnea and disrupt normal sleep architecture. - Sleep hygiene should be reviewed to assess factors that may improve sleep quality. - Weight management and regular exercise should be initiated or continued if appropriate.  [Electronically signed] 05/22/2018 02:40 PM  Baird Lyons MD, [place], American Board of Sleep Medicine   NPI: [id number]                        [place], [place] [facility]  ELECTRONICALLY SIGNED ON:  05/22/2018, 2:38 PM [place] PH: [phone]   FX: [phone] [place]

## 2018-05-24 ENCOUNTER — Other Ambulatory Visit: Payer: Self-pay | Admitting: Family Medicine

## 2018-05-24 ENCOUNTER — Telehealth: Payer: Self-pay | Admitting: Family Medicine

## 2018-05-24 DIAGNOSIS — G4733 Obstructive sleep apnea (adult) (pediatric): Secondary | ICD-10-CM

## 2018-05-24 LAB — CYTOLOGY - PAP
Diagnosis: NEGATIVE
HPV: NOT DETECTED

## 2018-05-24 NOTE — Progress Notes (Signed)
cpap

## 2018-05-24 NOTE — Telephone Encounter (Signed)
Pt returned call.  Relayed message.  She states that she thinks the CPAP now is ok but she still wakes up tired and "feels herself wake up at night". Will forward to MD. [place], Salome Spotted, [place]

## 2018-05-24 NOTE — Telephone Encounter (Signed)
Called patient to discuss sleep study results but left message. According to results, they recommend a CPAP titration study or DME autopap if indicated. Wanted to know if they discussed these results with her and if her current CPAP is working ok for her. If so, we can likely forego a titration study.   Rory Percy, DO PGY-2, [facility] 05/24/2018 2:05 PM

## 2018-05-24 NOTE — Telephone Encounter (Signed)
Spoke with patient regarding continuing to feel tired despite new CPAP supplies received around 02/2018. Per our conversation, referred for CPAP titration study. If patient continues to have difficulty sleeping after titration, advised to make appointment to work up further.

## 2018-06-04 DIAGNOSIS — M25512 Pain in left shoulder: Secondary | ICD-10-CM | POA: Diagnosis not present

## 2018-06-04 DIAGNOSIS — M7542 Impingement syndrome of left shoulder: Secondary | ICD-10-CM | POA: Diagnosis not present

## 2018-06-15 ENCOUNTER — Ambulatory Visit (AMBULATORY_SURGERY_CENTER): Payer: Self-pay

## 2018-06-15 ENCOUNTER — Telehealth: Payer: Self-pay

## 2018-06-15 VITALS — Ht 66.0 in | Wt 277.2 lb

## 2018-06-15 DIAGNOSIS — Z1211 Encounter for screening for malignant neoplasm of colon: Secondary | ICD-10-CM

## 2018-06-15 MED ORDER — NA SULFATE-K SULFATE-MG SULF 17.5-3.13-1.6 GM/177ML PO SOLN: 1.0000 | Freq: Once | ORAL | 0 refills | Status: AC

## 2018-06-15 NOTE — Progress Notes (Signed)
Denies allergies to eggs or soy products. Denies complication of anesthesia or sedation. Denies use of weight loss medication. Denies use of O2.   Emmi instructions declined.    During PV patient states that she is limited to 2 liters of fluids due to her Congested heart failure. Echo done on 04/10/16  EF 70- 75 % and no aortic stenosis. Patient also states that she has been difficult to wake up after surgeries. I told the patient that I would send a note to Dr. Silverio Decamp as well as Osvaldo Angst.

## 2018-06-15 NOTE — Telephone Encounter (Signed)
Dr. Silverio Decamp,  Sophia Wilkinson is a direct colonoscopy screening scheduled for 06/28/18. Patient states that she has a Psychologist, forensic for congestive heart failure. Patient states that she can only have 2 liters of fluids per day. Patient also states that in past surgeries she has been difficult to wake up. I told her that I would make sure that her physician and Osvaldo Angst were aware of her concerns. Please advise if this patient is able to proceed with her procedure at the [facility]. Thanks!  Riki Sheer, LPN ( [place] )

## 2018-06-15 NOTE — Telephone Encounter (Signed)
If she is average risk for colorectal cancer screening with no history of polyps or family history for colon cancer and no blood per rectum, can do Cologaurd instead for screening.  If patient want to proceed with colonoscopy she will need to contact her cardiologist to discuss volume management as she will need to drink clear fluids at least 2-3L even with low volume bowel preps.  Thanks

## 2018-06-15 NOTE — Telephone Encounter (Signed)
I spoke with Sophia Wilkinson regarding her concerns regarding her colonoscopy and relayed Dr. Woodward Ku suggestions. Patient is average risk for colorectal cancer screening and she is not experiencing any symptoms. Patient was very concerned about the volume that she would be required to drink. [place] prefers to do a Education officer, environmental. [place] states that she will contact her PCP and get the test. If Cologuard is positive she will call and reschedule the colonoscopy after she receives cardiac clearance.   Riki Sheer, LPN ( PV )

## 2018-06-18 ENCOUNTER — Ambulatory Visit (INDEPENDENT_AMBULATORY_CARE_PROVIDER_SITE_OTHER): Payer: Medicare Other | Admitting: Cardiology

## 2018-06-18 ENCOUNTER — Encounter: Payer: Self-pay | Admitting: Cardiology

## 2018-06-18 VITALS — BP 110/70 | HR 60 | Ht 66.0 in | Wt 273.8 lb

## 2018-06-18 DIAGNOSIS — R079 Chest pain, unspecified: Secondary | ICD-10-CM | POA: Diagnosis not present

## 2018-06-18 DIAGNOSIS — I421 Obstructive hypertrophic cardiomyopathy: Secondary | ICD-10-CM | POA: Diagnosis not present

## 2018-06-18 DIAGNOSIS — Z950 Presence of cardiac pacemaker: Secondary | ICD-10-CM | POA: Diagnosis not present

## 2018-06-18 NOTE — Patient Instructions (Addendum)
Medication Instructions Your Physician recommend that you continue on your current mediations.    If you need a refill on your cardiac medications before your next appointment, please call your pharmacy.   Labwork: None  Procedures/Testing: Your physician has requested that you have a stress echocardiogram. For further information please visit [email or web address]. Please follow instruction sheet as given. [place] [address] Hold Metoprolol 24 hours prior to test  Follow-Up: Your physician wants you to follow-up in 6 months with Dr. Harrell Gave. You will receive a reminder letter in the mail two months in advance. If you don't receive a letter, please call our office at [phone] to schedule this follow-up appointment.   Special Instructions:     Thank you for choosing [facility]!!

## 2018-06-18 NOTE — Progress Notes (Signed)
Cardiology Office Note:    Date:  06/18/2018   ID:  Sophia Wilkinson, DOB 03/14/1966, MRN [id number]  PCP:  Sophia Percy, DO  Cardiologist:  Sophia Dresser, MD PhD  Referring MD: Sophia Percy, DO   CC: establish care, chest pain  History of Present Illness:    Sophia Wilkinson is a 52 y.o. female with a hx of hypertrophic cardiomyopathy who is seen as a new consult at the request of Dr. Ky Wilkinson for evaluation and management of heart failure. She has a past medical history of hypertrophic cardiomyopathy without significant obstruction, chronic diastolic heart failure, morbid obesity, obstructive sleep apnea on CPAP, and bradycardia s/p PPM.  Moved to the area 11/2017. Was followed at [facility]. Is due for in person interrogation of her device Sophia Wilkinson pacemaker, not an ICD). Saw Dr. Delena Wilkinson in April of this year for a checkup. Lost her son 14 mos ago, moved back to [place] after that. Has a good social support here. She is short of breath with moderate exertion but is able to walk around on flat ground without much issue.   Last several days has had an ache in her chest. Not related to exertion or position. It does not stop her from being active in her daily activities, but it is a new sensation for her. No radiation, no aggravating/alleviating factors. No associated nausea, diaphoresis, or shortness of breath.   She denies family history of sudden cardiac death. No syncope. No known history of NSVT/VT.   Working on her diet, cut out sguars and starches. Has lost 10 lbs Most days she walks around the store for exercise.   Per her most recent note at [facility]: Sophia Wilkinson is a 52 y.o. female with a history of HCM, obesity, CHF and bradycardia s/p PPM who presents for follow-up evaluation at [facility] cardiology today. She has stable NYHA Class III symptoms. Her last exercise HCM TTE in 01/2017 deminstrated a peak LVOT gradient of 14 mmHg with exercise and chordal SAM;  exercised to 7.2 METS without ischemia; septum 2.4 cm; EF at rest 70-75% without WMA. Her volume looks reasonable well controlled today. She is normotensive. The noise from her atrial leading is causing mode switching rarely (<1% of the time), likely too infrequent to necessitate lead revision. I have encouraged increased exercise, as she had better exercise capacity when more conditioned with regular walking. She will continue BB, ARB, spironolactone and torsemide. I will look into getting her a local cardiologist in the [place] area. For now, we will schedule a 6 month follow-up.    Past Medical History:  Diagnosis Date  . Allergy   . Anemia   . Blood transfusion without reported diagnosis   . Bradycardia   . Cancer ([place])   . Cardiomyopathy ([place])   . CHF (congestive heart failure) ([place])   . Chronic pain   . Diabetes mellitus without complication ([place])   . GERD (gastroesophageal reflux disease)   . Heart murmur   . Migraines   . Sleep apnea     Past Surgical History:  Procedure Laterality Date  . ABDOMINAL HYSTERECTOMY  2016  . HERNIA REPAIR    . PACEMAKER IMPLANT  2016  . TUBAL LIGATION  1999    Current Medications: Current Outpatient Medications on File Prior to Visit  Medication Sig  . acetaminophen (TYLENOL) 500 MG tablet Take by mouth.  . candesartan (ATACAND) 4 MG tablet Take 0.5 tablets (2 mg total) by mouth daily.  Marland Kitchen  loratadine (CLARITIN) 10 MG tablet Take 1 tablet (10 mg total) by mouth daily.  . metFORMIN (GLUCOPHAGE XR) 500 MG 24 hr tablet Take 1 tablet (500 mg total) by mouth daily with breakfast.  . metoprolol succinate (TOPROL-XL) 25 MG 24 hr tablet Take 3 tablets (75 mg total) by mouth daily.  . Metoprolol-Hydrochlorothiazide 25-12.5 MG TB24 metoprolol succ 25 mg-hydrochlorothiazide 12.5 mg tablet,ext.rel 24 hr  . potassium chloride (K-DUR) 10 MEQ tablet Take 1-2 tablets daily as directed  . spironolactone (ALDACTONE) 25 MG tablet Take 0.5 tablets (12.5 mg  total) by mouth daily.  Marland Kitchen torsemide (DEMADEX) 10 MG tablet Take 1 tablet (10 mg total) by mouth daily.   No current facility-administered medications on file prior to visit.      Allergies:   Patient has no known allergies.   Social History   Socioeconomic History  . Marital status: Single    Spouse name: Not on file  . Number of children: Not on file  . Years of education: Not on file  . Highest education level: Not on file  Occupational History  . Not on file  Social Needs  . Financial resource strain: Not on file  . Food insecurity:    Worry: Not on file    Inability: Not on file  . Transportation needs:    Medical: Not on file    Non-medical: Not on file  Tobacco Use  . Smoking status: Never Smoker  . Smokeless tobacco: Never Used  Substance and Sexual Activity  . Alcohol use: Never    Frequency: Never  . Drug use: Never  . Sexual activity: Not on file  Lifestyle  . Physical activity:    Days per week: Not on file    Minutes per session: Not on file  . Stress: Not on file  Relationships  . Social connections:    Talks on phone: Not on file    Gets together: Not on file    Attends religious service: Not on file    Active member of club or organization: Not on file    Attends meetings of clubs or organizations: Not on file    Relationship status: Not on file  Other Topics Concern  . Not on file  Social History Narrative  . Not on file     Family History: The patient's family history includes Alcohol abuse in her father, mother, and sister; Diabetes in her maternal grandmother; Heart disease in her maternal grandmother; Hyperlipidemia in her maternal grandmother. There is no history of Colon cancer, Esophageal cancer, Rectal cancer, or Stomach cancer.  Mother passed away age 32 from liver disease. Father passed away at age 33 from overdose. Grandparents passed in their 36s from cancer, MS, and heart disease. Brother living at age 51.   ROS:   Please see the  history of present illness.  Additional pertinent ROS: ROS   EKGs/Labs/Other Studies Reviewed:    The following studies were reviewed today: Notes through Care Everywhere:  Remote pacemaker interrogation 04/22/18: Pacemaker Evaluation  Sherryl Barters had a remote evaluation which was performed today  at the [facility].  Device Information Device Manufacturer: Swift [id number] Date of Implant:07-17-15  Battery Data: Battery Voltage: 2.99 V  Estimated time to replacement: 10.4-11.0 years  Rhythm Data: Atrial High Rate Episodes 467 representing <1 % of the patient's rhythm Longest episode 3 minutes, 32 seconds Ventricular High Rate0  Lead Performance Data: RARV  Lead Impedance (  ohms) 330560  Sensing (mV) 2.14.7  Capture (V@ms ).625 @ 0.4 .875 @ 0.4 % Pacing 57 %1.9 %   Alerts/Observations/Events:  None  Final Programmed Values: ModeDDDR Paced Lower-Upper Rate: 60 - 120 bpm  Changes Made Today: None. The counters and diagnostics were reset.  Assessment/Plan:Normal device evaluation and function.Lead function is  appropriate, with capture and sensing thresholds are as expected. There  were no significant arrhythmias noted (see Rhythm Data above).  Follow up will be in September in the device clinic, with remote follow up  every 3 months until scheduled clinic visit.  HCM treadmill stress echo 01/09/2017: CONCLUSIONS  Non-obstructive HCM.   Reduced exercise capacity for age (7.2 METs, 90% of age predicted).   Appropriate blood pressure response to exertion.   No evidence of exercise induced ischemia by echocardiography at a sub- adequate heart rate. Please note that the HCM echocardiogram is focusedon the assessment of hemodynamics (peak outflow tract gradient) at peakstress rather than wall motion analysis. Wall motion  images are collectedlater after termination of stress and thus it is likely that ischemic findings will be missed at lower heart rates. Inducible ischemia at higher workloadscannot be ruled out.While no wall motion abnormalities were noted onthis study, this study was performed to evaluate LV outflow obstructionwith exercise rather than being optimized to evaluate for stress inducedwall motion.  Calculated gradients assume an LA pressure of 10 mmHg. Gradients maybe underestimated if the true peak MR velocity is not captured. Directgradients may be overestimated if the Doppler signal is contaminated.  Treadmill Results  Protocol: Bruce Duration (min:sec):06:11 METS:  Stage:Amyl NitrateBP: (mmHg) UX:NATFTDDU:KGU HR: 131% MPHR 77  (mL inhaled)Sys Diast(bpm)    Baseline111 52 65  151 77 75  2 168 74 131  Peak 131  1 min recovery135 66 100  Recovery117 46 77  Recovery110 50 72   Reason for Termination:Dyspnea  Symptoms:Complained of shortness of breath and appeared dyspneic throughout exercise.  Recovery Time (min:sec):05:33   FINDINGS   EKG FINDINGS  Baseline:  Normal sinus rhythm, nonspecific T-wave changes.   Peak:  Sinus tachycardia with occasional PVCs, no ischemic EKG changes. METS:7.20  Hyperdynamic LV systolic function, LVEF estimated at 75-80%. All LVwalls augment appropriately.Near LV cavity obliteration. Peak LVOTgradient with exercise of 14 mmHg.Chordal SAM.  Recovery:  Normal sinus rhythm with frequent PVCs and ventricular couplet in earlyrecovery, no ischemic EKG  changes.  Hyperdynamic LV systolic function, LVEF estimated at 70%.No regionalwall motion abnormalities. Chordal SAM.   Echo 01/09/2017 CONCLUSIONS   Normal left ventricular cavity size. Severe asymetric left ventricular hypertropy (septum 2.4 cm, posterior wall 1.1 cm).Increased (hyperdynamic) left ventricular ejection fraction. LVEF estimated at 70-75%.No regional wall motion abnormalities.Normal diastolic filling pattern for age.   Normal right ventricular size. Normal right ventricular global  systolic function.    Systolic anterior motion of the mitral valve chordae. No significant LVOT gradient at rest or with Valsalva (< 10 mmHg); post-PVC gradient 14 mmHg.   No significant valve dysfunction.   EKG:  EKG is ordered today.  The ekg ordered today demonstrates atrial paced rhythm  Recent Labs: 04/14/2018: BUN 11; Creatinine, Ser 0.80; Potassium 4.3; Sodium 140  Recent Lipid Panel No results found for: CHOL, TRIG, HDL, CHOLHDL, VLDL, LDLCALC, LDLDIRECT  Physical Exam:    VS:  BP 110/70 (BP Location: Left Arm)   Pulse 60   Ht 5\' 6"  (1.676 m)   Wt 273 lb 12.8 oz (124.2 kg)   BMI 44.19 kg/m     Wt  Readings from Last 3 Encounters:  06/15/18 277 lb 3.2 oz (125.7 kg)  05/21/18 275 lb (124.7 kg)  05/14/18 277 lb (125.6 kg)     GEN: Well nourished, well developed in no acute distress HEENT: Normal NECK: No JVD; No carotid bruits LYMPHATICS: No lymphadenopathy CARDIAC: regular rhythm, normal S1 and S2, no rubs or gallops. 1/6 systolic ejection murmur at LSB, increases slightly with Valsalva. Radial and DP pulses 2+ bilaterally. RESPIRATORY:  Clear to auscultation without rales, wheezing or rhonchi  ABDOMEN: Soft, non-tender, non-distended MUSCULOSKELETAL:  Trace bilateral LE edema; No deformity  SKIN: Warm and dry NEUROLOGIC:  Alert and oriented x 3 PSYCHIATRIC:  Normal affect, does become tearful on interview when talking about her son  ASSESSMENT:      1. HOCM (hypertrophic obstructive cardiomyopathy) ([place])   2. Pacemaker   3. Severe obesity (BMI >= 40) (HCC) Chronic  4. Chest pain, unspecified type    PLAN:    1. Hypertrophic cardiomyopathy, question obstruction as source of chest pain: no red flags of syncope, family history of SCD but does have new nonexertional chest tightness. Had prior stress imaging aimed at LVOT gradient, but no significant wall motion abnormalities. -given that her symptoms may be due to obstruction, will pursue stress echocardiography for full examination. It appears that she was previously able to exercise to a good heart rate, and she reports being able to walk at baseline around the store.  2. Pacemaker: will refer to EP. No report of NSVT, but septal thickness and any NSVT on interrogation may warrant upgrade to ICD.  3. Morbid obesity: she is changing her diet and trying to be more active, and she has already lost 10 lbs. Encouraged her to continue in this, and exercise capacity via stress will also be able to verify that she is safe to push herself with activity.  Plan for follow up: 6 mos or sooner PRN  Medication Adjustments/Labs and Tests Ordered: Current medicines are reviewed at length with the patient today.  Concerns regarding medicines are outlined above.  Orders Placed This Encounter  Procedures  . Ambulatory referral to Cardiac Electrophysiology  . EKG 12-Lead  . ECHOCARDIOGRAM STRESS TEST   No orders of the defined types were placed in this encounter.   Patient Instructions  Medication Instructions Your Physician recommend that you continue on your current mediations.    If you need a refill on your cardiac medications before your next appointment, please call your pharmacy.   Labwork: None  Procedures/Testing: Your physician has requested that you have a stress echocardiogram. For further information please visit [email or web address]. Please follow instruction sheet as  given. [place] [address] Hold Metoprolol 24 hours prior to test  Follow-Up: Your physician wants you to follow-up in 6 months with Dr. Harrell Gave. You will receive a reminder letter in the mail two months in advance. If you don't receive a letter, please call our office at [phone] to schedule this follow-up appointment.   Special Instructions:     Thank you for choosing Heart[facility] at [facility]!!       Signed, Sophia Dresser, MD PhD 06/18/2018 8:40 AM    [place]

## 2018-06-28 ENCOUNTER — Encounter: Payer: Medicare Other | Admitting: Gastroenterology

## 2018-07-01 ENCOUNTER — Telehealth (HOSPITAL_COMMUNITY): Payer: Self-pay | Admitting: *Deleted

## 2018-07-01 NOTE — Telephone Encounter (Signed)
Patient given detailed instructions per Stress Test Requisition Sheet for test on 07/07/18 at 7:30.Patient Notified to arrive 30 minutes early, and that it is imperative to arrive on time for appointment to keep from having the test rescheduled.  Patient verbalized understanding. Veronia Beets

## 2018-07-06 ENCOUNTER — Ambulatory Visit (HOSPITAL_COMMUNITY): Payer: Medicare Other

## 2018-07-06 ENCOUNTER — Other Ambulatory Visit (HOSPITAL_COMMUNITY): Payer: Medicare Other

## 2018-07-06 ENCOUNTER — Telehealth (HOSPITAL_COMMUNITY): Payer: Self-pay | Admitting: *Deleted

## 2018-07-06 NOTE — Telephone Encounter (Signed)
Patient given detailed instructions per Stress Test Requisition Sheet for test on 07/07/18 at 0730.Patient Notified to arrive 30 minutes early, and that it is imperative to arrive on time for appointment to keep from having the test rescheduled.  Patient verbalized understanding. Jaydeen Darley, [place]e [place]

## 2018-07-07 ENCOUNTER — Other Ambulatory Visit (HOSPITAL_COMMUNITY): Payer: Medicare Other

## 2018-07-07 ENCOUNTER — Ambulatory Visit (HOSPITAL_BASED_OUTPATIENT_CLINIC_OR_DEPARTMENT_OTHER): Payer: Medicare Other

## 2018-07-07 ENCOUNTER — Ambulatory Visit (HOSPITAL_COMMUNITY): Payer: Medicare Other | Attending: Cardiovascular Disease

## 2018-07-07 DIAGNOSIS — E119 Type 2 diabetes mellitus without complications: Secondary | ICD-10-CM | POA: Diagnosis not present

## 2018-07-07 DIAGNOSIS — R001 Bradycardia, unspecified: Secondary | ICD-10-CM | POA: Diagnosis not present

## 2018-07-07 DIAGNOSIS — R011 Cardiac murmur, unspecified: Secondary | ICD-10-CM | POA: Diagnosis not present

## 2018-07-07 DIAGNOSIS — I421 Obstructive hypertrophic cardiomyopathy: Secondary | ICD-10-CM

## 2018-07-07 DIAGNOSIS — G4733 Obstructive sleep apnea (adult) (pediatric): Secondary | ICD-10-CM | POA: Insufficient documentation

## 2018-07-10 ENCOUNTER — Ambulatory Visit (HOSPITAL_BASED_OUTPATIENT_CLINIC_OR_DEPARTMENT_OTHER): Payer: Medicare Other | Attending: Family Medicine | Admitting: Internal Medicine

## 2018-07-10 VITALS — Ht 66.0 in | Wt 276.0 lb

## 2018-07-10 DIAGNOSIS — R0683 Snoring: Secondary | ICD-10-CM | POA: Diagnosis not present

## 2018-07-10 DIAGNOSIS — I493 Ventricular premature depolarization: Secondary | ICD-10-CM | POA: Diagnosis not present

## 2018-07-10 DIAGNOSIS — G4733 Obstructive sleep apnea (adult) (pediatric): Secondary | ICD-10-CM | POA: Diagnosis not present

## 2018-07-13 DIAGNOSIS — G4733 Obstructive sleep apnea (adult) (pediatric): Secondary | ICD-10-CM | POA: Diagnosis not present

## 2018-07-13 NOTE — Procedures (Signed)
Patient Name: Sophia Wilkinson, Sophia Wilkinson Date: 07/10/2018 Gender: Female D.O.B: November 21, 1965 Age (years): 53 Referring Provider: Leeanne Rio Height (inches): 66 Interpreting Physician: Baird Lyons MD, ABSM Weight (lbs): 277 RPSGT: [place], [place] BMI: 45 MRN: [id number] Neck Size: 16.00  CLINICAL INFORMATION The patient is referred for a CPAP titration to treat sleep apnea.  Date of NPSG, Split Night or HST: NPSG 05/14/18 AHI 12.1/ hr, desaturation to 83%, body weight 277 lbs  SLEEP STUDY TECHNIQUE As per the AASM Manual for the Scoring of Sleep and Associated Events v2.3 (April 2016) with a hypopnea requiring 4% desaturations.  The channels recorded and monitored were frontal, central and occipital EEG, electrooculogram (EOG), submentalis EMG (chin), nasal and oral airflow, thoracic and abdominal wall motion, anterior tibialis EMG, snore microphone, electrocardiogram, and pulse oximetry. Continuous positive airway pressure (CPAP) was initiated at the beginning of the study and titrated to treat sleep-disordered breathing.  MEDICATIONS Medications self-administered by patient taken the night of the study : None reported  TECHNICIAN COMMENTS Comments added by technician: The patient slept upright propped on many pillows for much of the night as she does at home. Comments added by scorer: N/A RESPIRATORY PARAMETERS Optimal PAP Pressure (cm): 10 AHI at Optimal Pressure (/hr): 0.0 Overall Minimal O2 (%): 90.0 Supine % at Optimal Pressure (%): 100 Minimal O2 at Optimal Pressure (%): 94.0   SLEEP ARCHITECTURE The study was initiated at 11:07:33 PM and ended at 5:24:22 AM.  Sleep onset time was 16.5 minutes and the sleep efficiency was 89.6%%. The total sleep time was 337.8 minutes.  The patient spent 8.3%% of the night in stage N1 sleep, 60.2%% in stage N2 sleep, 9.2%% in stage N3 and 22.4% in REM.Stage REM latency was 53.5 minutes  Wake after sleep onset was 22.5. Alpha  intrusion was absent. Supine sleep was 38.10%.  CARDIAC DATA The 2 lead EKG demonstrated pacemaker generated. The mean heart rate was 60.3 beats per minute. Other EKG findings include: PVCs.  LEG MOVEMENT DATA The total Periodic Limb Movements of Sleep (PLMS) were 0. The PLMS index was 0.0. A PLMS index of <15 is considered normal in adults.  IMPRESSIONS - The optimal PAP pressure was 10 cm of water. - Central sleep apnea was not noted during this titration (CAI = 0.0/h). - Significant oxygen desaturations were not observed during this titration (min O2 = 90.0%). - The patient snored with soft snoring volume during this titration study. - 2-lead EKG demonstrated: PVCs - Clinically significant periodic limb movements were not noted during this study. Arousals associated with PLMs were rare.  DIAGNOSIS - Obstructive Sleep Apnea (327.23 [G47.33 ICD-10])  RECOMMENDATIONS - Trial of CPAP therapy on 10 cm H2O or DME autopap 5-15. Patient used a Technical sales engineer Pro Gel mask and heated humidification. - Be careful with alcohol, sedatives and other CNS depressants that may worsen sleep apnea and disrupt normal sleep architecture. - Sleep hygiene should be reviewed to assess factors that may improve sleep quality. - Weight management and regular exercise should be initiated or continued.  [Electronically signed] 07/13/2018 01:09 PM  Baird Lyons MD, [place], American Board of Sleep Medicine   NPI: [id number]                           [place], [place] [facility]  ELECTRONICALLY SIGNED ON:  07/13/2018, 1:04 PM [place]: [phone]  FX: [phone] [place]

## 2018-07-14 ENCOUNTER — Ambulatory Visit (INDEPENDENT_AMBULATORY_CARE_PROVIDER_SITE_OTHER): Payer: Medicare Other

## 2018-07-14 VITALS — BP 118/78 | HR 60 | Temp 98.3°F | Ht 66.0 in | Wt 276.0 lb

## 2018-07-14 DIAGNOSIS — Z1211 Encounter for screening for malignant neoplasm of colon: Secondary | ICD-10-CM | POA: Diagnosis not present

## 2018-07-14 DIAGNOSIS — Z0000 Encounter for general adult medical examination without abnormal findings: Secondary | ICD-10-CM | POA: Diagnosis not present

## 2018-07-14 NOTE — Patient Instructions (Addendum)
Sophia Wilkinson , Thank you for taking time to come for your Medicare Wellness Visit. I appreciate your ongoing commitment to your health goals. Please review the following plan we discussed and let me know if I can assist you in the future.   These are the goals we discussed: Goals   None     This is a list of the screening recommended for you and due dates:  Health Maintenance  Topic Date Due  . Colon Cancer Screening  06/16/2017  . Mammogram  11/14/2019  . Pap Smear  05/21/2021  . Tetanus Vaccine  01/23/2025  . HIV Screening  Completed  . Flu Shot  Discontinued   Colonoscopy, Adult A colonoscopy is an exam to look at the entire large intestine. During the exam, a lubricated, bendable tube is inserted into the anus and then passed into the rectum, colon, and other parts of the large intestine. A colonoscopy is often done as a part of normal colorectal screening or in response to certain symptoms, such as anemia, persistent diarrhea, abdominal pain, and blood in the stool. The exam can help screen for and diagnose medical problems, including:  Tumors.  Polyps.  Inflammation.  Areas of bleeding.  Tell a health care provider about:  Any allergies you have.  All medicines you are taking, including vitamins, herbs, eye drops, creams, and over-the-counter medicines.  Any problems you or family members have had with anesthetic medicines.  Any blood disorders you have.  Any surgeries you have had.  Any medical conditions you have.  Any problems you have had passing stool. What are the risks? Generally, this is a safe procedure. However, problems may occur, including:  Bleeding.  A tear in the intestine.  A reaction to medicines given during the exam.  Infection (rare).  What happens before the procedure? Eating and drinking restrictions Follow instructions from your health care provider about eating and drinking, which may include:  A few days before the  procedure - follow a low-fiber diet. Avoid nuts, seeds, dried fruit, raw fruits, and vegetables.  1-3 days before the procedure - follow a clear liquid diet. Drink only clear liquids, such as clear broth or bouillon, black coffee or tea, clear juice, clear soft drinks or sports drinks, gelatin dessert, and popsicles. Avoid any liquids that contain red or purple dye.  On the day of the procedure - do not eat or drink anything during the 2 hours before the procedure, or within the time period that your health care provider recommends.  Bowel prep If you were prescribed an oral bowel prep to clean out your colon:  Take it as told by your health care provider. Starting the day before your procedure, you will need to drink a large amount of medicated liquid. The liquid will cause you to have multiple loose stools until your stool is almost clear or light green.  If your skin or anus gets irritated from diarrhea, you may use these to relieve the irritation: ? Medicated wipes, such as adult wet wipes with aloe and vitamin E. ? A skin soothing-product like petroleum jelly.  If you vomit while drinking the bowel prep, take a break for up to 60 minutes and then begin the bowel prep again. If vomiting continues and you cannot take the bowel prep without vomiting, call your health care provider.  General instructions  Ask your health care provider about changing or stopping your regular medicines. This is especially important if you are taking diabetes  medicines or blood thinners.  Plan to have someone take you home from the hospital or clinic. What happens during the procedure?  An IV tube may be inserted into one of your veins.  You will be given medicine to help you relax (sedative).  To reduce your risk of infection: ? Your health care team will wash or sanitize their hands. ? Your anal area will be washed with soap.  You will be asked to lie on your side with your knees bent.  Your health  care provider will lubricate a long, thin, flexible tube. The tube will have a camera and a light on the end.  The tube will be inserted into your anus.  The tube will be gently eased through your rectum and colon.  Air will be delivered into your colon to keep it open. You may feel some pressure or cramping.  The camera will be used to take images during the procedure.  A small tissue sample may be removed from your body to be examined under a microscope (biopsy). If any potential problems are found, the tissue will be sent to a lab for testing.  If small polyps are found, your health care provider may remove them and have them checked for cancer cells.  The tube that was inserted into your anus will be slowly removed. The procedure may vary among health care providers and hospitals. What happens after the procedure?  Your blood pressure, heart rate, breathing rate, and blood oxygen level will be monitored until the medicines you were given have worn off.  Do not drive for 24 hours after the exam.  You may have a small amount of blood in your stool.  You may pass gas and have mild abdominal cramping or bloating due to the air that was used to inflate your colon during the exam.  It is up to you to get the results of your procedure. Ask your health care provider, or the department performing the procedure, when your results will be ready. This information is not intended to replace advice given to you by your health care provider. Make sure you discuss any questions you have with your health care provider. Document Released: 10/17/2000 Document Revised: 08/20/2016 Document Reviewed: 01/01/2016 Elsevier Interactive Patient Education  2018 [employer].   Fat and Cholesterol Restricted Diet Getting too much fat and cholesterol in your diet may cause health problems. Following this diet helps keep your fat and cholesterol at normal levels. This can keep you from getting sick. What  types of fat should I choose?  Choose monosaturated and polyunsaturated fats. These are found in foods such as olive oil, canola oil, flaxseeds, walnuts, almonds, and seeds.  Eat more omega-3 fats. Good choices include salmon, mackerel, sardines, tuna, flaxseed oil, and ground flaxseeds.  Limit saturated fats. These are in animal products such as meats, butter, and cream. They can also be in plant products such as palm oil, palm kernel oil, and coconut oil.  Avoid foods with partially hydrogenated oils in them. These contain trans fats. Examples of foods that have trans fats are stick margarine, some tub margarines, cookies, crackers, and other baked goods. What general guidelines do I need to follow?  Check food labels. Look for the words "trans fat" and "saturated fat."  When preparing a meal: ? Fill half of your plate with vegetables and green salads. ? Fill one fourth of your plate with whole grains. Look for the word "whole" as the first word in  the ingredient list. ? Fill one fourth of your plate with lean protein foods.  Eat more foods that have fiber, like apples, carrots, beans, peas, and barley.  Eat more home-cooked foods. Eat less at restaurants and buffets.  Limit or avoid alcohol.  Limit foods high in starch and sugar.  Limit fried foods.  Cook foods without frying them. Baking, boiling, grilling, and broiling are all great options.  Lose weight if you are overweight. Losing even a small amount of weight can help your overall health. It can also help prevent diseases such as diabetes and heart disease. What foods can I eat? Grains Whole grains, such as whole wheat or whole grain breads, crackers, cereals, and pasta. Unsweetened oatmeal, bulgur, barley, quinoa, or brown rice. Corn or whole wheat flour tortillas. Vegetables Fresh or frozen vegetables (raw, steamed, roasted, or grilled). Green salads. Fruits All fresh, canned (in natural juice), or frozen fruits. Meat  and Other Protein Products Ground beef (85% or leaner), grass-fed beef, or beef trimmed of fat. Skinless chicken or [place]. Ground chicken or [place]. Pork trimmed of fat. All fish and seafood. Eggs. Dried beans, peas, or lentils. Unsalted nuts or seeds. Unsalted canned or dry beans. Dairy Low-fat dairy products, such as skim or 1% milk, 2% or reduced-fat cheeses, low-fat ricotta or cottage cheese, or plain low-fat yogurt. Fats and Oils Tub margarines without trans fats. Light or reduced-fat mayonnaise and salad dressings. Avocado. Olive, canola, sesame, or safflower oils. Natural peanut or almond butter (choose ones without added sugar and oil). The items listed above may not be a complete list of recommended foods or beverages. Contact your dietitian for more options. What foods are not recommended? Grains White bread. White pasta. White rice. Cornbread. Bagels, pastries, and croissants. Crackers that contain trans fat. Vegetables White potatoes. Corn. Creamed or fried vegetables. Vegetables in a cheese sauce. Fruits Dried fruits. Canned fruit in light or heavy syrup. Fruit juice. Meat and Other Protein Products Fatty cuts of meat. Ribs, chicken wings, bacon, sausage, bologna, salami, chitterlings, fatback, hot dogs, bratwurst, and packaged luncheon meats. Liver and organ meats. Dairy Whole or 2% milk, cream, half-and-half, and cream cheese. Whole milk cheeses. Whole-fat or sweetened yogurt. Full-fat cheeses. Nondairy creamers and whipped toppings. Processed cheese, cheese spreads, or cheese curds. Sweets and Desserts Corn syrup, sugars, honey, and molasses. Candy. Jam and jelly. Syrup. Sweetened cereals. Cookies, pies, cakes, donuts, muffins, and ice cream. Fats and Oils Butter, stick margarine, lard, shortening, ghee, or bacon fat. Coconut, palm kernel, or palm oils. Beverages Alcohol. Sweetened drinks (such as sodas, lemonade, and fruit drinks or punches). The items listed above may not  be a complete list of foods and beverages to avoid. Contact your dietitian for more information. This information is not intended to replace advice given to you by your health care provider. Make sure you discuss any questions you have with your health care provider. Document Released: 04/20/2012 Document Revised: 06/26/2016 Document Reviewed: 01/19/2014 Elsevier Interactive Patient Education  2018 [place] Maintenance, Female Adopting a healthy lifestyle and getting preventive care can go a long way to promote health and wellness. Talk with your health care provider about what schedule of regular examinations is right for you. This is a good chance for you to check in with your provider about disease prevention and staying healthy. In between checkups, there are plenty of things you can do on your own. Experts have done a lot of research about which lifestyle changes and preventive  measures are most likely to keep you healthy. Ask your health care provider for more information. Weight and diet Eat a healthy diet  Be sure to include plenty of vegetables, fruits, low-fat dairy products, and lean protein.  Do not eat a lot of foods high in solid fats, added sugars, or salt.  Get regular exercise. This is one of the most important things you can do for your health. ? Most adults should exercise for at least 150 minutes each week. The exercise should increase your heart rate and make you sweat (moderate-intensity exercise). ? Most adults should also do strengthening exercises at least twice a week. This is in addition to the moderate-intensity exercise.  Maintain a healthy weight  Body mass index (BMI) is a measurement that can be used to identify possible weight problems. It estimates body fat based on height and weight. Your health care provider can help determine your BMI and help you achieve or maintain a healthy weight.  For females 81 years of age and older: ? A BMI below 18.5  is considered underweight. ? A BMI of 18.5 to 24.9 is normal. ? A BMI of 25 to 29.9 is considered overweight. ? A BMI of 30 and above is considered obese.  Watch levels of cholesterol and blood lipids  You should start having your blood tested for lipids and cholesterol at 51 years of age, then have this test every 5 years.  You may need to have your cholesterol levels checked more often if: ? Your lipid or cholesterol levels are high. ? You are older than 51 years of age. ? You are at high risk for heart disease.  Cancer screening Lung Cancer  Lung cancer screening is recommended for adults 72-24 years old who are at high risk for lung cancer because of a history of smoking.  A yearly low-dose CT scan of the lungs is recommended for people who: ? Currently smoke. ? Have quit within the past 15 years. ? Have at least a 30-pack-year history of smoking. A pack year is smoking an average of one pack of cigarettes a day for 1 year.  Yearly screening should continue until it has been 15 years since you quit.  Yearly screening should stop if you develop a health problem that would prevent you from having lung cancer treatment.  Breast Cancer  Practice breast self-awareness. This means understanding how your breasts normally appear and feel.  It also means doing regular breast self-exams. Let your health care provider know about any changes, no matter how small.  If you are in your 20s or 30s, you should have a clinical breast exam (CBE) by a health care provider every 1-3 years as part of a regular health exam.  If you are 9 or older, have a CBE every year. Also consider having a breast X-ray (mammogram) every year.  If you have a family history of breast cancer, talk to your health care provider about genetic screening.  If you are at high risk for breast cancer, talk to your health care provider about having an MRI and a mammogram every year.  Breast cancer gene (BRCA)  assessment is recommended for women who have family members with BRCA-related cancers. BRCA-related cancers include: ? Breast. ? Ovarian. ? Tubal. ? Peritoneal cancers.  Results of the assessment will determine the need for genetic counseling and BRCA1 and BRCA2 testing.  Cervical Cancer Your health care provider may recommend that you be screened regularly for cancer of the  pelvic organs (ovaries, uterus, and vagina). This screening involves a pelvic examination, including checking for microscopic changes to the surface of your cervix (Pap test). You may be encouraged to have this screening done every 3 years, beginning at age 19.  For women ages 25-65, health care providers may recommend pelvic exams and Pap testing every 3 years, or they may recommend the Pap and pelvic exam, combined with testing for human papilloma virus (HPV), every 5 years. Some types of HPV increase your risk of cervical cancer. Testing for HPV may also be done on women of any age with unclear Pap test results.  Other health care providers may not recommend any screening for nonpregnant women who are considered low risk for pelvic cancer and who do not have symptoms. Ask your health care provider if a screening pelvic exam is right for you.  If you have had past treatment for cervical cancer or a condition that could lead to cancer, you need Pap tests and screening for cancer for at least 20 years after your treatment. If Pap tests have been discontinued, your risk factors (such as having a new sexual partner) need to be reassessed to determine if screening should resume. Some women have medical problems that increase the chance of getting cervical cancer. In these cases, your health care provider may recommend more frequent screening and Pap tests.  Colorectal Cancer  This type of cancer can be detected and often prevented.  Routine colorectal cancer screening usually begins at 51 years of age and continues through 51  years of age.  Your health care provider may recommend screening at an earlier age if you have risk factors for colon cancer.  Your health care provider may also recommend using home test kits to check for hidden blood in the stool.  A small camera at the end of a tube can be used to examine your colon directly (sigmoidoscopy or colonoscopy). This is done to check for the earliest forms of colorectal cancer.  Routine screening usually begins at age 60.  Direct examination of the colon should be repeated every 5-10 years through 51 years of age. However, you may need to be screened more often if early forms of precancerous polyps or small growths are found.  Skin Cancer  Check your skin from head to toe regularly.  Tell your health care provider about any new moles or changes in moles, especially if there is a change in a mole's shape or color.  Also tell your health care provider if you have a mole that is larger than the size of a pencil eraser.  Always use sunscreen. Apply sunscreen liberally and repeatedly throughout the day.  Protect yourself by wearing long sleeves, pants, a wide-brimmed hat, and sunglasses whenever you are outside.  Heart disease, diabetes, and high blood pressure  High blood pressure causes heart disease and increases the risk of stroke. High blood pressure is more likely to develop in: ? People who have blood pressure in the high end of the normal range (130-139/85-89 mm Hg). ? People who are overweight or obese. ? People who are African American.  If you are 36-50 years of age, have your blood pressure checked every 3-5 years. If you are 15 years of age or older, have your blood pressure checked every year. You should have your blood pressure measured twice-once when you are at a hospital or clinic, and once when you are not at a hospital or clinic. Record the average of the  two measurements. To check your blood pressure when you are not at a hospital or  clinic, you can use: ? An automated blood pressure machine at a pharmacy. ? A home blood pressure monitor.  If you are between 16 years and 69 years old, ask your health care provider if you should take aspirin to prevent strokes.  Have regular diabetes screenings. This involves taking a blood sample to check your fasting blood sugar level. ? If you are at a normal weight and have a low risk for diabetes, have this test once every three years after 51 years of age. ? If you are overweight and have a high risk for diabetes, consider being tested at a younger age or more often. Preventing infection Hepatitis B  If you have a higher risk for hepatitis B, you should be screened for this virus. You are considered at high risk for hepatitis B if: ? You were born in a country where hepatitis B is common. Ask your health care provider which countries are considered high risk. ? Your parents were born in a high-risk country, and you have not been immunized against hepatitis B (hepatitis B vaccine). ? You have HIV or AIDS. ? You use needles to inject street drugs. ? You live with someone who has hepatitis B. ? You have had sex with someone who has hepatitis B. ? You get hemodialysis treatment. ? You take certain medicines for conditions, including cancer, organ transplantation, and autoimmune conditions.  Hepatitis C  Blood testing is recommended for: ? Everyone born from [birth year] through 1964. ? Anyone with known risk factors for hepatitis C.  Sexually transmitted infections (STIs)  You should be screened for sexually transmitted infections (STIs) including gonorrhea and chlamydia if: ? You are sexually active and are younger than 51 years of age. ? You are older than 51 years of age and your health care provider tells you that you are at risk for this type of infection. ? Your sexual activity has changed since you were last screened and you are at an increased risk for chlamydia or gonorrhea.  Ask your health care provider if you are at risk.  If you do not have HIV, but are at risk, it may be recommended that you take a prescription medicine daily to prevent HIV infection. This is called pre-exposure prophylaxis (PrEP). You are considered at risk if: ? You are sexually active and do not regularly use condoms or know the HIV status of your partner(s). ? You take drugs by injection. ? You are sexually active with a partner who has HIV.  Talk with your health care provider about whether you are at high risk of being infected with HIV. If you choose to begin PrEP, you should first be tested for HIV. You should then be tested every 3 months for as long as you are taking PrEP. Pregnancy  If you are premenopausal and you may become pregnant, ask your health care provider about preconception counseling.  If you may become pregnant, take 400 to 800 micrograms (mcg) of folic acid every day.  If you want to prevent pregnancy, talk to your health care provider about birth control (contraception). Osteoporosis and menopause  Osteoporosis is a disease in which the bones lose minerals and strength with aging. This can result in serious bone fractures. Your risk for osteoporosis can be identified using a bone density scan.  If you are 33 years of age or older, or if you are at risk for  osteoporosis and fractures, ask your health care provider if you should be screened.  Ask your health care provider whether you should take a calcium or vitamin D supplement to lower your risk for osteoporosis.  Menopause may have certain physical symptoms and risks.  Hormone replacement therapy may reduce some of these symptoms and risks. Talk to your health care provider about whether hormone replacement therapy is right for you. Follow these instructions at home:  Schedule regular health, dental, and eye exams.  Stay current with your immunizations.  Do not use any tobacco products including cigarettes,  chewing tobacco, or electronic cigarettes.  If you are pregnant, do not drink alcohol.  If you are breastfeeding, limit how much and how often you drink alcohol.  Limit alcohol intake to no more than 1 drink per day for nonpregnant women. One drink equals 12 ounces of beer, 5 ounces of wine, or 1 ounces of hard liquor.  Do not use street drugs.  Do not share needles.  Ask your health care provider for help if you need support or information about quitting drugs.  Tell your health care provider if you often feel depressed.  Tell your health care provider if you have ever been abused or do not feel safe at home. This information is not intended to replace advice given to you by your health care provider. Make sure you discuss any questions you have with your health care provider. Document Released: 05/05/2011 Document Revised: 03/27/2016 Document Reviewed: 07/24/2015 Elsevier Interactive Patient Education  [employer].

## 2018-07-14 NOTE — Progress Notes (Signed)
Subjective:   Sophia Wilkinson is a 52 y.o. female who presents for an Initial Medicare Annual Wellness Visit.  Review of Systems    Physical assessment deferred to PCP.  Cardiac Risk Factors include: obesity (BMI >30kg/m2), HCOM, CHF.    Objective:    Today's Vitals   07/14/18 0903  BP: 118/78  Pulse: 60  Temp: 98.3 F (36.8 C)  TempSrc: Oral  SpO2: 98%  Weight: 276 lb (125.2 kg)  Height: 5\' 6"  (1.676 m)  PainSc: 3   PainLoc: Shoulder   Body mass index is 44.55 kg/m.  Advanced Directives 07/14/2018 07/10/2018 05/21/2018 05/14/2018 05/14/2018  Does Patient Have a Medical Advance Directive? No No No No No  Would patient like information on creating a medical advance directive? Yes (MAU/Ambulatory/Procedural Areas - Information given) No - Patient declined No - Patient declined No - Patient declined Yes (MAU/Ambulatory/Procedural Areas - Information given)    Current Medications (verified) Outpatient Encounter Medications as of 07/14/2018  Medication Sig  . acetaminophen (TYLENOL) 500 MG tablet Take by mouth.  . candesartan (ATACAND) 4 MG tablet Take 0.5 tablets (2 mg total) by mouth daily.  Marland Kitchen loratadine (CLARITIN) 10 MG tablet Take 1 tablet (10 mg total) by mouth daily.  . metoprolol succinate (TOPROL-XL) 25 MG 24 hr tablet Take 3 tablets (75 mg total) by mouth daily.  . potassium chloride (K-DUR) 10 MEQ tablet Take 1-2 tablets daily as directed  . spironolactone (ALDACTONE) 25 MG tablet Take 0.5 tablets (12.5 mg total) by mouth daily.  Marland Kitchen torsemide (DEMADEX) 10 MG tablet Take 1 tablet (10 mg total) by mouth daily.  . metFORMIN (GLUCOPHAGE XR) 500 MG 24 hr tablet Take 1 tablet (500 mg total) by mouth daily with breakfast. (Patient not taking: Reported on 07/14/2018)   No facility-administered encounter medications on file as of 07/14/2018.     Allergies (verified) Patient has no known allergies.   History: Past Medical History:  Diagnosis Date  . Allergy   . Anemia   .  Blood transfusion without reported diagnosis   . Bradycardia   . Cancer ([place])   . Cardiomyopathy ([place])   . CHF (congestive heart failure) ([place])   . Chronic pain   . Diabetes mellitus without complication ([place])   . GERD (gastroesophageal reflux disease)   . Heart murmur   . Migraines   . Sleep apnea    Past Surgical History:  Procedure Laterality Date  . ABDOMINAL HYSTERECTOMY  2016  . HERNIA REPAIR    . LAPAROSCOPY ABDOMEN DIAGNOSTIC    . LEEP    . PACEMAKER IMPLANT  2016  . TUBAL LIGATION  1999   Family History  Problem Relation Age of Onset  . Alcohol abuse Mother   . Liver disease Mother   . Alcohol abuse Father   . Drug abuse Father   . Alcohol abuse Sister   . Heart disease Maternal Grandmother   . Diabetes Maternal Grandmother   . Hyperlipidemia Maternal Grandmother   . Heart disease Paternal Grandmother   . Diabetes Sister   . Hypertension Daughter   . Diabetes Daughter   . Colon cancer Neg Hx   . Esophageal cancer Neg Hx   . Rectal cancer Neg Hx   . Stomach cancer Neg Hx    Social History   Socioeconomic History  . Marital status: Single    Spouse name: Not on file  . Number of children: 5  . Years of education: 4 year college  .  Highest education level: Bachelor's degree (e.g., BA, AB, BS)  Occupational History  . Not on file  Social Needs  . Financial resource strain: Not hard at all  . Food insecurity:    Worry: Never true    Inability: Never true  . Transportation needs:    Medical: No    Non-medical: No  Tobacco Use  . Smoking status: Never Smoker  . Smokeless tobacco: Never Used  Substance and Sexual Activity  . Alcohol use: Never    Frequency: Never  . Drug use: Never  . Sexual activity: Not Currently    Birth control/protection: Surgical  Lifestyle  . Physical activity:    Days per week: 7 days    Minutes per session: 60 min  . Stress: To some extent  Relationships  . Social connections:    Talks on phone: More than three times  a week    Gets together: More than three times a week    Attends religious service: More than 4 times per year    Active member of club or organization: Yes    Attends meetings of clubs or organizations: 1 to 4 times per year    Relationship status: Never married  Other Topics Concern  . Not on file  Social History Narrative   Lives with daughter and two grandkids in a manufactured home. Has steps into home (3). No pets. Likes to spend time with grandkids, reading, computer. Has smoke alarms, no throw rugs, no cords across floors. No grab bars.   Tobacco Counseling Counseling given: No  Clinical Intake:  Pre-visit preparation completed: Yes  Pain : 0-10 Pain Score: 3  Pain Type: Chronic pain Pain Location: Shoulder Pain Orientation: Left Pain Relieving Factors: none Effect of Pain on Daily Activities: can't lift, carry, or lay on that side  Pain Relieving Factors: none  Nutritional Status: BMI > 30  Obese Diabetes: No  How often do you need to have someone help you when you read instructions, pamphlets, or other written materials from your doctor or pharmacy?: 1 - Never What is the last grade level you completed in school?: Bachelor's degree  Interpreter Needed?: No   Activities of Daily Living In your present state of health, do you have any difficulty performing the following activities: 07/14/2018  Hearing? N  Vision? N  Difficulty concentrating or making decisions? N  Walking or climbing stairs? N  Dressing or bathing? N  Doing errands, shopping? N  Preparing Food and eating ? N  Using the Toilet? N  In the past six months, have you accidently leaked urine? N  Do you have problems with loss of bowel control? N  Managing your Medications? N  Managing your Finances? N  Housekeeping or managing your Housekeeping? N    Immunizations and Health Maintenance Immunization History  Administered Date(s) Administered  . Pneumococcal Polysaccharide-23 05/21/2018  .  Tdap 01/24/2015, 01/24/2015   Health Maintenance Due  Topic Date Due  . MAMMOGRAM  06/16/2017  . COLONOSCOPY  06/16/2017    Patient Care Team: Rory Percy, DO as PCP - General (Family Medicine) Buford Dresser, MD as PCP - Cardiology (Cardiology)  Indicate any recent Medical Services you may have received from other than [facility] providers in the past year (date may be approximate).     Assessment:  This is a routine wellness examination for Sophia Wilkinson. The patient was informed that the wellness visit is to identify future health risk and educate and initiate measures that can reduce risk  for increased disease through the lifespan.   Hearing/Vision screen  Hearing Screening   Method: Audiometry   125Hz  250Hz  500Hz  1000Hz  2000Hz  3000Hz  4000Hz  6000Hz  8000Hz   Right ear:  Pass Pass Pass Pass  Pass    Left ear:  Pass Pass Pass Pass  Pass      Visual Acuity Screening   Right eye Left eye Both eyes  Without correction:     With correction: 20/20 20/20 20/20     Dietary issues and exercise activities discussed: Current Exercise Habits: Home exercise routine, Type of exercise: walking, Time (Minutes): 60, Frequency (Times/Week): 7, Weekly Exercise (Minutes/Week): 420, Intensity: Mild  Goals   None    Depression Screen PHQ 2/9 Scores 07/14/2018 05/21/2018  PHQ - 2 Score 1 0    Fall Risk Fall Risk  07/14/2018  Falls in the past year? No   Is the patient's home free of loose throw rugs in walkways, pet beds, electrical cords, etc?   yes      Grab bars in the bathroom? no      Handrails on the stairs?   yes      Adequate lighting?   yes  Cognitive Function: MMSE - Mini Mental State Exam 07/14/2018  Orientation to time 5  Orientation to Place 5  Registration 3  Attention/ Calculation 5  Recall 3  Language- name 2 objects 2  Language- repeat 1  Language- follow 3 step command 3  Language- read & follow direction 1  Write a sentence 1  Copy design 1  Total score 30       6CIT Screen 07/14/2018  What Year? 0 points  What month? 0 points  What time? 0 points  Count back from 20 0 points  Months in reverse 0 points  Repeat phrase 0 points  Total Score 0    Screening Tests Health Maintenance  Topic Date Due  . MAMMOGRAM  06/16/2017  . COLONOSCOPY  06/16/2017  . PAP SMEAR  05/21/2021  . TETANUS/TDAP  01/23/2025  . HIV Screening  Completed  . INFLUENZA VACCINE  Discontinued   Patient declined flu vaccine.  Cancer Screenings: Lung: Low Dose CT Chest recommended if Age 48-80 years, 30 pack-year currently smoking OR have quit w/in 15years. Patient does not qualify. Breast: Up to date on Mammogram? Yes   Up to date of Bone Density/Dexa? Yes Colorectal: Needs to have Cologard per GI doctor due to CHF.  Additional Screenings:  Hepatitis C Screening: complete     Plan:  Patient needs Cologard screening per GI doctor due to CHF. If positive, will need cardiac clearance before proceeding with colonoscopy.  I have personally reviewed and noted the following in the patient's chart:   . Medical and social history . Use of alcohol, tobacco or illicit drugs  . Current medications and supplements . Functional ability and status . Nutritional status . Physical activity . Advanced directives . List of other physicians . Hospitalizations, surgeries, and ER visits in previous 12 months . Vitals . Screenings to include cognitive, depression, and falls . Referrals and appointments  In addition, I have reviewed and discussed with patient certain preventive protocols, quality metrics, and best practice recommendations. A written personalized care plan for preventive services as well as general preventive health recommendations were provided to patient.     Esau Grew, RN   07/14/2018

## 2018-07-19 NOTE — Progress Notes (Signed)
I have reviewed this visit and discussed with Danley Danker, RN, and agree with her documentation.  Rory Percy, DO PGY-2, [facility] 07/19/2018 2:37 PM

## 2018-07-26 ENCOUNTER — Encounter: Payer: Self-pay | Admitting: Gastroenterology

## 2018-08-05 ENCOUNTER — Ambulatory Visit (INDEPENDENT_AMBULATORY_CARE_PROVIDER_SITE_OTHER): Payer: Medicare Other | Admitting: Cardiovascular Disease

## 2018-08-05 ENCOUNTER — Encounter: Payer: Self-pay | Admitting: Cardiovascular Disease

## 2018-08-05 VITALS — BP 100/67 | HR 60 | Ht 66.0 in | Wt 275.8 lb

## 2018-08-05 DIAGNOSIS — Z950 Presence of cardiac pacemaker: Secondary | ICD-10-CM

## 2018-08-05 DIAGNOSIS — I422 Other hypertrophic cardiomyopathy: Secondary | ICD-10-CM | POA: Diagnosis not present

## 2018-08-05 DIAGNOSIS — T82110A Breakdown (mechanical) of cardiac electrode, initial encounter: Secondary | ICD-10-CM

## 2018-08-05 DIAGNOSIS — I495 Sick sinus syndrome: Secondary | ICD-10-CM | POA: Diagnosis not present

## 2018-08-05 NOTE — Progress Notes (Signed)
Cardiology Office Note:    Date:  08/06/2018   ID:  Sophia Wilkinson, DOB October 23, 1966, MRN [id number]  PCP:  Rory Percy, DO  Cardiologist:  Buford Dresser, MD  Electrophysiologist:  None (pacemaker - Chevy Sweigert)  Referring MD: Rory Percy, DO   Chief Complaint  Patient presents with  . Pacemaker Problem    History of Present Illness:    Sophia Wilkinson is a 52 y.o. female with a hx of hypertrophic cardiomyopathy, chronic diastolic heart failure morbid obesity, obstructive sleep apnea and a history of bradycardia for which she received a dual-chamber permanent pacemaker, recently relocated here from [place]. She has established cardiology follow-up with Dr. Harrell Gave, whom she saw on August 16.  She has unexpected moments of weakness and the chest discomfort, reminding her of the way she felt before she had a pacemaker implanted.  She has not experienced syncope and does not describe palpitations.  Symptoms are not related to physical activity can occur randomly at rest.  She has complaints of fatigue and also has significant daytime hypersomnolence (Epworth scale of 19).  Dual-chamber Saint Jude Assurity DR device was implanted in September 2016 at [facility] and has roughly 10-11 years of anticipated longevity.  She is 51% atrial pacing and only 2.7% ventricular pacing.  She has very frequent episodes of "noise" on her atrial lead (Hyrum 2088), lasting for 30 seconds at a time (longest 1 minute), falsely registered as episodes of mode switch.  Thousands of episodes have occurred during the last year, approximately 50 of them just the last 2 weeks.  In addition there is evidence of intermittent loss of capture on the atrial channel.  However, automatic capture threshold and manual threshold tested today are normal at 0.65 V at 0.4 ms and the P waves are sensed at 2.1 mV.  Impedance is relatively low at 330 ohms.  The ventricular lead seems to be functioning normally  (impedance 560 ohms, R waves 4.7 mV, threshold 0.875 V at 0.4 ms).  There are no episodes of high ventricular rate.  She had a normal stress echo on July 07, 2018.  There was no inducible outflow tract gradient.  There were no regional wall motion abnormalities at peak exercise.  She was able to exercise for 6 minutes, 7 METS.  Past Medical History:  Diagnosis Date  . Allergy   . Anemia   . Blood transfusion without reported diagnosis   . Bradycardia   . Cancer ([place])   . Cardiomyopathy ([place])   . CHF (congestive heart failure) ([place])   . Chronic pain   . Diabetes mellitus without complication ([place])   . GERD (gastroesophageal reflux disease)   . Heart murmur   . Migraines   . Sleep apnea     Past Surgical History:  Procedure Laterality Date  . ABDOMINAL HYSTERECTOMY  2016  . HERNIA REPAIR    . LAPAROSCOPY ABDOMEN DIAGNOSTIC    . LEEP    . PACEMAKER IMPLANT  2016  . TUBAL LIGATION  1999    Current Medications: Current Meds  Medication Sig  . acetaminophen (TYLENOL) 500 MG tablet Take by mouth.  . loratadine (CLARITIN) 10 MG tablet Take 1 tablet (10 mg total) by mouth daily.  . potassium chloride (K-DUR) 10 MEQ tablet Take 1-2 tablets daily as directed  . spironolactone (ALDACTONE) 25 MG tablet Take 0.5 tablets (12.5 mg total) by mouth daily.  Marland Kitchen torsemide (DEMADEX) 10 MG tablet Take 1 tablet (10 mg total) by mouth daily.  Allergies:   Patient has no known allergies.   Social History   Socioeconomic History  . Marital status: Single    Spouse name: Not on file  . Number of children: 5  . Years of education: 4 year college  . Highest education level: Bachelor's degree (e.g., BA, AB, BS)  Occupational History  . Not on file  Social Needs  . Financial resource strain: Not hard at all  . Food insecurity:    Worry: Never true    Inability: Never true  . Transportation needs:    Medical: No    Non-medical: No  Tobacco Use  . Smoking status: Never Smoker  .  Smokeless tobacco: Never Used  Substance and Sexual Activity  . Alcohol use: Never    Frequency: Never  . Drug use: Never  . Sexual activity: Not Currently    Birth control/protection: Surgical  Lifestyle  . Physical activity:    Days per week: 7 days    Minutes per session: 60 min  . Stress: To some extent  Relationships  . Social connections:    Talks on phone: More than three times a week    Gets together: More than three times a week    Attends religious service: More than 4 times per year    Active member of club or organization: Yes    Attends meetings of clubs or organizations: 1 to 4 times per year    Relationship status: Never married  Other Topics Concern  . Not on file  Social History Narrative   Lives with daughter and two grandkids in a manufactured home. Has steps into home (3). No pets. Likes to spend time with grandkids, reading, computer. Has smoke alarms, no throw rugs, no cords across floors. No grab bars.     Family History: The patient's family history includes Alcohol abuse in her father, mother, and sister; Diabetes in her daughter, maternal grandmother, and sister; Drug abuse in her father; Heart disease in her maternal grandmother and paternal grandmother; Hyperlipidemia in her maternal grandmother; Hypertension in her daughter; Liver disease in her mother. There is no history of Colon cancer, Esophageal cancer, Rectal cancer, or Stomach cancer.  ROS:   Please see the history of present illness.     All other systems reviewed and are negative.  EKGs/Labs/Other Studies Reviewed:    The following studies were reviewed today: Stress echo, notes from Dr. Harrell Gave  EKG:  EKG is not ordered today.  The ekg ordered 06/18/2018 demonstrates Atrial paced, ventricular sensed rhythm without LVH (?  Masked by obesity) , borderline QTC 460 ms  Recent Labs: 04/14/2018: BUN 11; Creatinine, Ser 0.80; Potassium 4.3; Sodium 140  Recent Lipid Panel No results found  for: CHOL, TRIG, HDL, CHOLHDL, VLDL, LDLCALC, LDLDIRECT  Physical Exam:    VS:  BP 100/67 (BP Location: Right Arm, Patient Position: Sitting, Cuff Size: Large)   Pulse 60   Ht 5\' 6"  (1.676 m)   Wt 275 lb 12.8 oz (125.1 kg)   BMI 44.52 kg/m      Wt Readings from Last 3 Encounters:  08/05/18 275 lb 12.8 oz (125.1 kg)  07/14/18 276 lb (125.2 kg)  07/10/18 276 lb (125.2 kg)     GEN: Morbidly obese well nourished, well developed in no acute distress HEENT: Normal NECK: No JVD; No carotid bruits LYMPHATICS: No lymphadenopathy CARDIAC: RRR, no murmurs, rubs, gallops, well-healed left subclavian pacemaker site RESPIRATORY:  Clear to auscultation without rales, wheezing or rhonchi  ABDOMEN: Soft, non-tender, non-distended MUSCULOSKELETAL:  No edema; No deformity  SKIN: Warm and dry NEUROLOGIC:  Alert and oriented x 3 PSYCHIATRIC:  Normal affect   ASSESSMENT:    1. SSS (sick sinus syndrome) ([place])   2. Hypertrophic cardiomyopathy ([place])   3. Pacemaker lead failure, initial encounter   4. Pacemaker   5. Morbid obesity ([place])    PLAN:    In order of problems listed above:  1. SSS: She is not pacemaker dependent and only needs A pacing roughly 50% of the time. 2. HCM: Without gradient at rest or inducible with exercise, does not require ventricular pacing 3. PPM lead failure: Her atrial lead shows evidence of "noise" with oversensing as well as intermittent loss of capture.  It is a [employer] known to have potential problems.  The abnormalities can be inconsistently reproduced with manipulation of the pocket.  Suspect that her periods of weakness in general uncomfortable feeling are related to loss of AV synchrony.  She is not at risk of syncope since she has both a reliable intrinsic rhythm and a normally functioning ventricular lead.  Unfortunately, there is no way to improve her symptomatology without revising the atrial lead.  There is still about 10 years until her  pacemaker generator will need replacement.  She is reluctant to have surgery but will think about her options.  It really depends on how symptomatic she is. 4. Morbid obesity: This puts her at increased risk for multiple complications including a high risk of infection with pacemaker procedures   Medication Adjustments/Labs and Tests Ordered: Current medicines are reviewed at length with the patient today.  Concerns regarding medicines are outlined above.  No orders of the defined types were placed in this encounter.  No orders of the defined types were placed in this encounter.   Patient Instructions  Dr Sallyanne Kuster recommends that you schedule a follow-up appointment in 3 months with a pacemaker check.  If you need a refill on your cardiac medications before your next appointment, please call your pharmacy.    Signed, Sanda Klein, MD  08/06/2018 2:54 PM    [place]

## 2018-08-05 NOTE — Patient Instructions (Addendum)
Dr Croitoru recommends that you schedule a follow-up appointment in 3 months with a pacemaker check.  If you need a refill on your cardiac medications before your next appointment, please call your pharmacy. 

## 2018-08-06 ENCOUNTER — Encounter: Payer: Self-pay | Admitting: Cardiovascular Disease

## 2018-08-09 ENCOUNTER — Encounter: Payer: Medicare Other | Admitting: Gastroenterology

## 2018-09-07 DIAGNOSIS — Z950 Presence of cardiac pacemaker: Secondary | ICD-10-CM | POA: Diagnosis not present

## 2018-09-15 DIAGNOSIS — Z1212 Encounter for screening for malignant neoplasm of rectum: Secondary | ICD-10-CM | POA: Diagnosis not present

## 2018-09-15 DIAGNOSIS — Z1211 Encounter for screening for malignant neoplasm of colon: Secondary | ICD-10-CM | POA: Diagnosis not present

## 2018-09-18 LAB — COLOGUARD: COLOGUARD: NEGATIVE

## 2018-09-24 ENCOUNTER — Encounter (HOSPITAL_COMMUNITY): Payer: Self-pay | Admitting: *Deleted

## 2018-09-24 ENCOUNTER — Ambulatory Visit (HOSPITAL_COMMUNITY)
Admission: EM | Admit: 2018-09-24 | Discharge: 2018-09-24 | Disposition: A | Discharge status: Home / Self Care | Payer: Medicare Other | Attending: Family Medicine | Admitting: Family Medicine

## 2018-09-24 ENCOUNTER — Ambulatory Visit (INDEPENDENT_AMBULATORY_CARE_PROVIDER_SITE_OTHER): Payer: Medicare Other

## 2018-09-24 DIAGNOSIS — J069 Acute upper respiratory infection, unspecified: Secondary | ICD-10-CM

## 2018-09-24 DIAGNOSIS — B9789 Other viral agents as the cause of diseases classified elsewhere: Secondary | ICD-10-CM

## 2018-09-24 DIAGNOSIS — R109 Unspecified abdominal pain: Secondary | ICD-10-CM

## 2018-09-24 DIAGNOSIS — R05 Cough: Secondary | ICD-10-CM

## 2018-09-24 DIAGNOSIS — M791 Myalgia, unspecified site: Secondary | ICD-10-CM

## 2018-09-24 DIAGNOSIS — R0602 Shortness of breath: Secondary | ICD-10-CM | POA: Diagnosis not present

## 2018-09-24 LAB — POCT URINALYSIS DIP (DEVICE)
Bilirubin Urine: NEGATIVE
Glucose, UA: NEGATIVE mg/dL
Ketones, ur: NEGATIVE mg/dL
Leukocytes, UA: NEGATIVE
Nitrite: NEGATIVE
Protein, ur: NEGATIVE mg/dL
Specific Gravity, Urine: 1.01 (ref 1.005–1.030)
Urobilinogen, UA: 0.2 mg/dL (ref 0.0–1.0)
pH: 5.5 (ref 5.0–8.0)

## 2018-09-24 MED ORDER — KETOROLAC TROMETHAMINE 30 MG/ML IJ SOLN
INTRAMUSCULAR | Status: AC
  Filled 2018-09-24: qty 1

## 2018-09-24 MED ORDER — BENZONATATE 100 MG PO CAPS: 100.0000 mg | ORAL_CAPSULE | Freq: Three times a day (TID) | ORAL | 0 refills | Status: DC

## 2018-09-24 MED ORDER — KETOROLAC TROMETHAMINE 30 MG/ML IJ SOLN
30.0000 mg | Freq: Once | INTRAMUSCULAR | Status: AC
  Administered 2018-09-24: 30 mg via INTRAMUSCULAR

## 2018-09-24 NOTE — ED Triage Notes (Addendum)
Patient reports SOB for 3 days, has CHF but states worsened over last 3 days. Patient with cough states it is her trying to catch her breath. Patient able to speak in complete sentences.   Reports left ear pain that started last night.   Reports left flank pain that started yesterday, reports constant but increases with movement.   Patient takes lasix, has not seen any changes in urination.

## 2018-09-24 NOTE — ED Provider Notes (Signed)
Sophia Halt, NP, signed patient out to me.  Please read her note for full HPI.  52 year old female with history of CHF, diabetes, GERD, cancer, pacemaker comes in for 3-day history of URI symptoms.  Shortness of breath related to cough.  She denies orthopnea, lower extremity swelling.  Patient was signed out to me pending chest x-ray.  If negative for CHF, to treat for viral infection.   Chest x-ray with borderline cardiomegaly and mild pulmonary vascular congestion.  No airspace consolidation, pleural effusion, pneumothorax.  Will treat for viral illness per plan of Sophia Halt, NP.  Return precautions given.  Patient expresses understanding and agrees to plan.   Ok Edwards, PA-C 09/24/18 [phone]

## 2018-09-24 NOTE — Discharge Instructions (Addendum)
Your urine was negative for infection Your flank pain is most likely related to a muscle strain from coughing. Toradol injection given here in clinic for pain inflammation Symptoms most likely due to viral illness. Start tessalon for cough.

## 2018-09-24 NOTE — ED Provider Notes (Signed)
[facility]    CSN: [id number] Arrival date & time: 09/24/18  1416     History   Chief Complaint Chief Complaint  Patient presents with  . Shortness of Breath  . Otalgia  . Flank Pain    HPI Sophia Wilkinson is a 52 y.o. female.   Patient is a 52 year old female past medical history of CHF, cardiomyopathy, heart murmur, diabetes, GERD, cancer, bradycardia, allergy, anemia , pacemaker.  Patient reports with 3 days of cough, congestion.  The cough is been dry.  She has had some increased shortness of breath related to the cough.  There is also been some nasal congestion and left ear discomfort.  She denies any associated fever, chills, body aches.  She denies any increased lower extremity edema.  Reports that her CHF is well controlled and she does not do daily weights.  She is limited to 2 L of fluids daily and keeps track of that. She has had 2 sick contacts with family that have been recent diagnosed with viral URIs.  She denies any recent traveling, smoking.  She has been taking her diuretics as prescribed.  She is also had approximately 2 days of left flank pain.  This is been waxing and waning.  She has not take anything for pain.  She denies any associated dysuria, hematuria.  She has chronic urinary frequency due to diuretic use.  She denies any vaginal symptoms.  She denies any history of kidney stones.  No associated nausea, vomiting, diarrhea.  ROS per HPI        Past Medical History:  Diagnosis Date  . Allergy   . Anemia   . Blood transfusion without reported diagnosis   . Bradycardia   . Cancer ([place])   . Cardiomyopathy ([place])   . CHF (congestive heart failure) ([place])   . Chronic pain   . Diabetes mellitus without complication ([place])   . GERD (gastroesophageal reflux disease)   . Heart murmur   . Migraines   . Sleep apnea     Patient Active Problem List   Diagnosis Date Noted  . Pain in joint of left shoulder 05/03/2018  . Prediabetes 07/28/2016    . Pacemaker 06/20/2016  . Dysthymia 04/22/2016  . H/O: hysterectomy 10/16/2015  . HOCM (hypertrophic obstructive cardiomyopathy) ([place]) 05/23/2015  . Chronic left shoulder pain 08/10/2014  . CHF (congestive heart failure) ([place]) 12/19/2012  . Sleep apnea 06/10/2011  . Severe obesity (BMI >= 40) ([place]) 04/02/2011  . Thrombocytosis ([place]) 03/14/2011  . Migraine without aura 09/20/2010  . History of lead poisoning 09/20/2010    Past Surgical History:  Procedure Laterality Date  . ABDOMINAL HYSTERECTOMY  2016  . HERNIA REPAIR    . LAPAROSCOPY ABDOMEN DIAGNOSTIC    . LEEP    . PACEMAKER IMPLANT  2016  . TUBAL LIGATION  1999    OB History   None      Home Medications    Prior to Admission medications   Medication Sig Start Date End Date Taking? Authorizing Provider  loratadine (CLARITIN) 10 MG tablet Take 1 tablet (10 mg total) by mouth daily. 04/14/18  Yes Rory Percy, DO  metoprolol succinate (TOPROL-XL) 25 MG 24 hr tablet Take 3 tablets (75 mg total) by mouth daily. 04/14/18 09/24/18 Yes Rory Percy, DO  potassium chloride (K-DUR) 10 MEQ tablet Take 1-2 tablets daily as directed 01/12/18  Yes [provider]  spironolactone (ALDACTONE) 25 MG tablet Take 0.5 tablets (12.5 mg total) by mouth  daily. 04/14/18  Yes Rory Percy, DO  torsemide (DEMADEX) 10 MG tablet Take 1 tablet (10 mg total) by mouth daily. 04/14/18  Yes Rory Percy, DO  acetaminophen (TYLENOL) 500 MG tablet Take by mouth. 11/03/98   [provider]  benzonatate (TESSALON) 100 MG capsule Take 1 capsule (100 mg total) by mouth every 8 (eight) hours. 09/24/18   Tasia Catchings, Amy V, PA-C  candesartan (ATACAND) 4 MG tablet Take 0.5 tablets (2 mg total) by mouth daily. 04/14/18 07/14/18  Rory Percy, DO    Family History Family History  Problem Relation Age of Onset  . Alcohol abuse Mother   . Liver disease Mother   . Alcohol abuse Father   . Drug abuse Father   . Alcohol abuse Sister   . Heart  disease Maternal Grandmother   . Diabetes Maternal Grandmother   . Hyperlipidemia Maternal Grandmother   . Heart disease Paternal Grandmother   . Diabetes Sister   . Hypertension Daughter   . Diabetes Daughter   . Colon cancer Neg Hx   . Esophageal cancer Neg Hx   . Rectal cancer Neg Hx   . Stomach cancer Neg Hx     Social History Social History   Tobacco Use  . Smoking status: Never Smoker  . Smokeless tobacco: Never Used  Substance Use Topics  . Alcohol use: Never    Frequency: Never  . Drug use: Never     Allergies   Patient has no known allergies.   Review of Systems Review of Systems   Physical Exam Triage Vital Signs ED Triage Vitals  Enc Vitals Group     BP 09/24/18 1503 124/83     Pulse Rate 09/24/18 1503 67     Resp 09/24/18 1503 (!) 21     Temp 09/24/18 1503 99 F (37.2 C)     Temp Source 09/24/18 1503 Oral     SpO2 09/24/18 1503 96 %     Weight --      Height --      Head Circumference --      Peak Flow --      Pain Score 09/24/18 1506 3     Pain Loc --      Pain Edu? --      Excl. in [place]? --    No data found.  Updated Vital Signs BP 124/83 (BP Location: Left Arm)   Pulse 67   Temp 99 F (37.2 C) (Oral)   Resp (!) 21   SpO2 96%   Visual Acuity Right Eye Distance:   Left Eye Distance:   Bilateral Distance:    Right Eye Near:   Left Eye Near:    Bilateral Near:     Physical Exam  Constitutional: She appears well-developed and well-nourished.  Non-toxic appearance. She does not appear ill.  HENT:  Head: Normocephalic.  Mouth/Throat: Oropharynx is clear and moist.  Bilateral TMs normal No tonsillar swelling or exudates. No adenopathy  Neck: Normal range of motion.  Cardiovascular: Normal rate, regular rhythm, normal heart sounds and intact distal pulses.  Pulmonary/Chest: Effort normal and breath sounds normal.  Lungs clear in all fields.  No wheezes, rhonchi or crackles.  No dyspnea or tachypnea  Abdominal: Soft. There is CVA  tenderness.  Musculoskeletal: Normal range of motion.       Right lower leg: Normal.       Left lower leg: Normal.  Neurological: She is alert.  Skin: Skin is warm and dry.  Psychiatric: She has a normal mood and affect.  Nursing note and vitals reviewed.    UC Treatments / Results  Labs (all labs ordered are listed, but only abnormal results are displayed) Labs Reviewed  POCT URINALYSIS DIP (DEVICE) - Abnormal; Notable for the following components:      Result Value   Hgb urine dipstick TRACE (*)    All other components within normal limits    EKG None  Radiology Dg Chest 2 View  Result Date: 09/24/2018 CLINICAL DATA:  Cough for 3 days with shortness of breath. History of CHF. EXAM: CHEST - 2 VIEW COMPARISON:  None. FINDINGS: A pacemaker is in place with leads terminating over the right atrium and right ventricle. The cardiac silhouette is borderline enlarged. There is mild central pulmonary vascular congestion without overt edema. No airspace consolidation, pleural effusion, or pneumothorax is identified. No acute osseous abnormality is seen. IMPRESSION: Borderline cardiomegaly and mild pulmonary vascular congestion. Electronically Signed   By: Logan Bores M.D.   On: 09/24/2018 16:13    Procedures Procedures (including critical care time)  Medications Ordered in UC Medications  ketorolac (TORADOL) 30 MG/ML injection 30 mg (30 mg Intramuscular Given 09/24/18 1612)    Initial Impression / Assessment and Plan / UC Course  I have reviewed the triage vital signs and the nursing notes.  Pertinent labs & imaging results that were available during my care of the patient were reviewed by me and considered in my medical decision making (see chart for details).     Patient is a 52 year old female comes in with 4 days of  increased shortness of breath, dry cough, congestion, ear pain.  This is been constant for 3 days.she has sick contacts at home. Lungs clear on exam.  Patient  does have a history of CHF.  She has concerns for this We will do chest x-ray to rule out any abnormalities. Most like this is a viral illness can treat with tessalon for cough.   Patient having left flank pain x2 days.  This is without urinary symptoms. Urine was negative for any infection.  Showed trace blood. This is most likely musculoskeletal pain.  Could be related to coughing. Toradol injection in the clinic for pain.   Chest x ray negative for pneumonia or CHF Pt sent home with tessalon for cough  Monitor for worsening symptoms.      Final Clinical Impressions(s) / UC Diagnoses   Final diagnoses:  Viral upper respiratory tract infection  Flank pain     Discharge Instructions     Your urine was negative for infection Your flank pain is most likely related to a muscle strain from coughing. Toradol injection given here in clinic for pain inflammation Symptoms most likely due to viral illness. Start tessalon for cough.    ED Prescriptions    Medication Sig Dispense Auth. Provider   benzonatate (TESSALON) 100 MG capsule Take 1 capsule (100 mg total) by mouth every 8 (eight) hours. 21 capsule Ok Edwards, PA-C     Controlled Substance Prescriptions [place] Controlled Substance Registry consulted? no   Orvan July, NP 09/25/18 1137

## 2018-11-10 ENCOUNTER — Ambulatory Visit (INDEPENDENT_AMBULATORY_CARE_PROVIDER_SITE_OTHER): Payer: Medicare Other | Admitting: Cardiovascular Disease

## 2018-11-10 ENCOUNTER — Encounter: Payer: Self-pay | Admitting: Cardiovascular Disease

## 2018-11-10 VITALS — BP 108/74 | HR 61 | Ht 66.0 in | Wt 279.0 lb

## 2018-11-10 DIAGNOSIS — I495 Sick sinus syndrome: Secondary | ICD-10-CM

## 2018-11-10 DIAGNOSIS — Z950 Presence of cardiac pacemaker: Secondary | ICD-10-CM

## 2018-11-10 DIAGNOSIS — I422 Other hypertrophic cardiomyopathy: Secondary | ICD-10-CM | POA: Diagnosis not present

## 2018-11-10 NOTE — Patient Instructions (Signed)
Medication Instructions:  Dr Sallyanne Kuster recommends that you continue on your current medications as directed. Please refer to the Current Medication list given to you today.  If you need a refill on your cardiac medications before your next appointment, please call your pharmacy.   Testing/Procedures: Remote monitoring is used to monitor your Pacemaker or ICD from home. This monitoring reduces the number of office visits required to check your device to one time per year. It allows [place] to keep an eye on the functioning of your device to ensure it is working properly. You are scheduled for a device check from home on Wednesday, April 8th, 2020. You may send your transmission at any time that day. If you have a wireless device, the transmission will be sent automatically. After your physician reviews your transmission, you will receive a postcard with your next transmission date.  To improve our patient care and to more adequately follow your device, [facility] has decided, as a practice, to start following each patient four times a year with your home monitor. This means that you may experience a remote appointment that is close to an in-office appointment with your physician. Your insurance will apply at the same rate as other remote monitoring transmissions.  Follow-Up: At [facility], you and your health needs are our priority.  As part of our continuing mission to provide you with exceptional heart care, we have created designated Provider Care Teams.  These Care Teams include your primary Cardiologist (physician) and Advanced Practice Providers (APPs -  Physician Assistants and Nurse Practitioners) who all work together to provide you with the care you need, when you need it. You will need a follow up appointment in 12 months. You may see Sanda Klein, MD or one of the following Advanced Practice Providers on your designated Care Team: [place], [place] . Fabian Sharp, PA-C . You will receive a  reminder letter in the mail two months in advance. If you don't receive a letter, please call our office to schedule the follow-up appointment.

## 2018-11-10 NOTE — Progress Notes (Signed)
Cardiology Office Note:    Date:  11/12/2018   ID:  Sophia Wilkinson, DOB 01/29/1966, MRN [id number]  PCP:  Rory Percy, DO  Cardiologist:  Buford Dresser, MD  Electrophysiologist:  None (pacemaker - Jerlisa Diliberto)  Referring MD: Rory Percy, DO   Chief Complaint  Patient presents with  . Follow-up    pt denied chest pain and SOB    History of Present Illness:    Sophia Wilkinson is a 52 y.o. female with a hx of hypertrophic cardiomyopathy, chronic diastolic heart failure morbid obesity, obstructive sleep apnea and a history of bradycardia for which she received a dual-chamber permanent pacemaker, recently relocated here from [place]. She has established cardiology follow-up with Dr. Harrell Gave, whom she saw on August 16.  She feels better when compared with her previous appointment.  She has not had any syncope or near syncope.  She denies angina, dyspnea at rest, orthopnea, PND or edema.  She continues to complain of fatigue.  She denies palpitations.  Dual-chamber [employer] Ass[employer] DR device was implanted in September 2016 at [facility] and has roughly 10-11 years of anticipated longevity.  She has 43% atrial pacing and only 2.7% ventricular pacing.  Somewhat fewer episodes of "noise" are seen on her atrial lead with episodes of false atrial mode switch.  In addition she also has occasional true paroxysmal atrial tachycardia but this is always brief.  Compared to her previous visit she does not have any episodes of loss of capture with her current device settings.. As before the atrial lead capture threshold and sensing are otherwise normal with P waves around 2 mV, but the impedance is relatively low around 300 ohms.  The ventricular lead is functioning normally (impedance 560 ohms, R waves 4.7 mV, threshold 0.875 V at 0.4 ms).  There are no meaningful  episodes of high ventricular rate.  She had a normal stress echo on July 07, 2018.  There was no inducible outflow  tract gradient.  There were no regional wall motion abnormalities at peak exercise.  She was able to exercise for 6 minutes, 7 METS.  Past Medical History:  Diagnosis Date  . Allergy   . Anemia   . Blood transfusion without reported diagnosis   . Bradycardia   . Cancer ([place])   . Cardiomyopathy ([place])   . CHF (congestive heart failure) ([place])   . Chronic pain   . Diabetes mellitus without complication ([place])   . GERD (gastroesophageal reflux disease)   . Heart murmur   . Migraines   . Sleep apnea     Past Surgical History:  Procedure Laterality Date  . ABDOMINAL HYSTERECTOMY  2016  . HERNIA REPAIR    . LAPAROSCOPY ABDOMEN DIAGNOSTIC    . LEEP    . PACEMAKER IMPLANT  2016  . TUBAL LIGATION  1999    Current Medications: Current Meds  Medication Sig  . acetaminophen (TYLENOL) 500 MG tablet Take by mouth.  . candesartan (ATACAND) 4 MG tablet Take 0.5 tablets (2 mg total) by mouth daily.  Marland Kitchen loratadine (CLARITIN) 10 MG tablet Take 1 tablet (10 mg total) by mouth daily.  . metoprolol succinate (TOPROL-XL) 25 MG 24 hr tablet Take 3 tablets (75 mg total) by mouth daily.  . potassium chloride (K-DUR) 10 MEQ tablet Take 1-2 tablets daily as directed  . spironolactone (ALDACTONE) 25 MG tablet Take 0.5 tablets (12.5 mg total) by mouth daily.  Marland Kitchen torsemide (DEMADEX) 10 MG tablet Take 1 tablet (10 mg total) by  mouth daily.     Allergies:   Patient has no known allergies.   Social History   Socioeconomic History  . Marital status: Single    Spouse name: Not on file  . Number of children: 5  . Years of education: 4 year college  . Highest education level: Bachelor's degree (e.g., BA, AB, BS)  Occupational History  . Not on file  Social Needs  . Financial resource strain: Not hard at all  . Food insecurity:    Worry: Never true    Inability: Never true  . Transportation needs:    Medical: No    Non-medical: No  Tobacco Use  . Smoking status: Never Smoker  . Smokeless tobacco:  Never Used  Substance and Sexual Activity  . Alcohol use: Never    Frequency: Never  . Drug use: Never  . Sexual activity: Not Currently    Birth control/protection: Surgical  Lifestyle  . Physical activity:    Days per week: 7 days    Minutes per session: 60 min  . Stress: To some extent  Relationships  . Social connections:    Talks on phone: More than three times a week    Gets together: More than three times a week    Attends religious service: More than 4 times per year    Active member of club or organization: Yes    Attends meetings of clubs or organizations: 1 to 4 times per year    Relationship status: Never married  Other Topics Concern  . Not on file  Social History Narrative   Lives with daughter and two grandkids in a manufactured home. Has steps into home (3). No pets. Likes to spend time with grandkids, reading, computer. Has smoke alarms, no throw rugs, no cords across floors. No grab bars.     Family History: The patient's family history includes Alcohol abuse in her father, mother, and sister; Diabetes in her daughter, maternal grandmother, and sister; Drug abuse in her father; Heart disease in her maternal grandmother and paternal grandmother; Hyperlipidemia in her maternal grandmother; Hypertension in her daughter; Liver disease in her mother. There is no history of Colon cancer, Esophageal cancer, Rectal cancer, or Stomach cancer.  ROS:   Please see the history of present illness.     All other systems reviewed and are negative.  EKGs/Labs/Other Studies Reviewed:    The following studies were reviewed today: Stress echo, notes from Dr. Harrell Gave  EKG:  EKG is ordered today.  It shows atrial paced, ventricular sensed rhythm.  There are prominent repolarization abnormalities in multiple leads with a pattern strongly suggesting secondary repull changes due to LVH; suspect obesity is masking the voltage for left ventricular hypertrophy  Recent  Labs: 04/14/2018: BUN 11; Creatinine, Ser 0.80; Potassium 4.3; Sodium 140  Recent Lipid Panel No results found for: CHOL, TRIG, HDL, CHOLHDL, VLDL, LDLCALC, LDLDIRECT  Physical Exam:    VS:  BP 108/74   Pulse 61   Ht 5\' 6"  (1.676 m)   Wt 279 lb (126.6 kg)   BMI 45.03 kg/m     Wt Readings from Last 3 Encounters:  11/10/18 279 lb (126.6 kg)  08/05/18 275 lb 12.8 oz (125.1 kg)  07/14/18 276 lb (125.2 kg)     General: Alert, oriented x3, no distress, morbidly obese.  Healthy subclavian pacemaker site. Head: no evidence of trauma, PERRL, EOMI, no exophtalmos or lid lag, no myxedema, no xanthelasma; normal ears, nose and oropharynx Neck: normal jugular  venous pulsations and no hepatojugular reflux; brisk carotid pulses without delay and no carotid bruits Chest: clear to auscultation, no signs of consolidation by percussion or palpation, normal fremitus, symmetrical and full respiratory excursions Cardiovascular: normal position and quality of the apical impulse, regular rhythm, normal first and second heart sounds, no murmurs, rubs or gallops Abdomen: no tenderness or distention, no masses by palpation, no abnormal pulsatility or arterial bruits, normal bowel sounds, no hepatosplenomegaly Extremities: no clubbing, cyanosis or edema; 2+ radial, ulnar and brachial pulses bilaterally; 2+ right femoral, posterior tibial and dorsalis pedis pulses; 2+ left femoral, posterior tibial and dorsalis pedis pulses; no subclavian or femoral bruits Neurological: grossly nonfocal Psych: Normal mood and affect  ASSESSMENT:    1. SSS (sick sinus syndrome) ([place])   2. Hypertrophic cardiomyopathy ([place])   3. Pacemaker   4. Morbid obesity ([place])    PLAN:    In order of problems listed above:  1. SSS: Not pacemaker dependent.  Satisfactory heart rate histogram distribution. 2. HCM: Without gradient at rest or inducible with exercise, does not require ventricular pacing, no evidence of ventricular  tachyarrhythmia on device check. 3. PPM: She has minor atrial lead malfunction, possibly a small insulation breach.  After the adjustment in her outputs she has not had any new episodes of loss of atrial capture but she continues to have intermittent brief episodes of atrial oversensing due to noise.  There is really no way to program around this.  Is not a serious medical problem to justify lead revision but when she gets a new pacemaker generator she should probably receive a new atrial lead.  Enroll in our device clinic with plan remote downloads every 3 months and yearly office visits.. 4. Morbid obesity: This puts her at increased risk for multiple complications including a high risk of infection with pacemaker procedures /strongly recommend efforts at weight loss, even bariatric surgery should be strongly considered.   Medication Adjustments/Labs and Tests Ordered: Current medicines are reviewed at length with the patient today.  Concerns regarding medicines are outlined above.  Orders Placed This Encounter  Procedures  . EKG 12-Lead   No orders of the defined types were placed in this encounter.   Patient Instructions  Medication Instructions:  Dr Sallyanne Kuster recommends that you continue on your current medications as directed. Please refer to the Current Medication list given to you today.  If you need a refill on your cardiac medications before your next appointment, please call your pharmacy.   Testing/Procedures: Remote monitoring is used to monitor your Pacemaker or ICD from home. This monitoring reduces the number of office visits required to check your device to one time per year. It allows [place] to keep an eye on the functioning of your device to ensure it is working properly. You are scheduled for a device check from home on Wednesday, April 8th, 2020. You may send your transmission at any time that day. If you have a wireless device, the transmission will be sent automatically. After  your physician reviews your transmission, you will receive a postcard with your next transmission date.  To improve our patient care and to more adequately follow your device, [facility] has decided, as a practice, to start following each patient four times a year with your home monitor. This means that you may experience a remote appointment that is close to an in-office appointment with your physician. Your insurance will apply at the same rate as other remote monitoring transmissions.  Follow-Up: At  [facility], you and your health needs are our priority.  As part of our continuing mission to provide you with exceptional heart care, we have created designated Provider Care Teams.  These Care Teams include your primary Cardiologist (physician) and Advanced Practice Providers (APPs -  Physician Assistants and Nurse Practitioners) who all work together to provide you with the care you need, when you need it. You will need a follow up appointment in 12 months. You may see Sanda Klein, MD or one of the following Advanced Practice Providers on your designated Care Team: [place], [place] . Fabian Sharp, PA-C . You will receive a reminder letter in the mail two months in advance. If you don't receive a letter, please call our office to schedule the follow-up appointment.    Signed, Sanda Klein, MD  11/12/2018 4:54 PM    [place]

## 2018-11-12 ENCOUNTER — Encounter: Payer: Self-pay | Admitting: Cardiovascular Disease

## 2018-12-23 ENCOUNTER — Ambulatory Visit (INDEPENDENT_AMBULATORY_CARE_PROVIDER_SITE_OTHER): Payer: Medicare Other | Admitting: Cardiology

## 2018-12-23 ENCOUNTER — Encounter: Payer: Self-pay | Admitting: Cardiology

## 2018-12-23 VITALS — BP 116/72 | HR 54 | Ht 67.0 in | Wt 282.6 lb

## 2018-12-23 DIAGNOSIS — Z6841 Body Mass Index (BMI) 40.0 and over, adult: Secondary | ICD-10-CM

## 2018-12-23 DIAGNOSIS — Z7189 Other specified counseling: Secondary | ICD-10-CM

## 2018-12-23 DIAGNOSIS — I422 Other hypertrophic cardiomyopathy: Secondary | ICD-10-CM | POA: Diagnosis not present

## 2018-12-23 DIAGNOSIS — R079 Chest pain, unspecified: Secondary | ICD-10-CM | POA: Diagnosis not present

## 2018-12-23 DIAGNOSIS — G4733 Obstructive sleep apnea (adult) (pediatric): Secondary | ICD-10-CM | POA: Diagnosis not present

## 2018-12-23 NOTE — Progress Notes (Signed)
Cardiology Office Note:    Date:  12/24/2018   ID:  Sophia Wilkinson, DOB 06/01/1966, MRN [id number]  PCP:  Rory Percy, DO  Cardiologist:  Buford Dresser, MD PhD Pacemaker: Dr. Sallyanne Kuster  Referring MD: Rory Percy, DO   CC: follow up  History of Present Illness:    Sophia Wilkinson is a 53 y.o. female with a hx of hypertrophic cardiomyopathy who is seen in followup today. She was initially seen as a new consult at the request of Dr. Ky Barban for evaluation and management of heart failure on 06/18/18.   Cardiac history: She has a past medical history of hypertrophic cardiomyopathy without significant obstruction, chronic diastolic heart failure, morbid obesity, obstructive sleep apnea on CPAP, and bradycardia s/p PPM. She had been followed by [facility] until moving to [facility] 11/2017. She denies family history of sudden cardiac death. No syncope. No known history of NSVT/VT. Last [facility] note summarized in initial consult note.  Today:  Still having intermittent chest pain, most recently last night. ECG done this AM.  Pain is achy, worse with taking a deep breath. Always feels short of breath, that is unchanged. Same chest discomfort that she was having back at the time of her stress test in 07/2018. Infrequent, not related to exertion, no clear aggravating/alleviating factors. She thinks it is related to stress.   Feels some shooting left leg pain, worse at night and when sitting still. No claudication type symptoms.  Asking questions re: how she would know if she was having a heart attack.  Has CPAP, but needs new equipment. Sees PCP tomorrow. Hasn't been able to use it.   Trying to manage stress, learned to crochet. Discussed diet and exercise. Was walking around [employer], but doesn't have much interest in doing other activities. Doesn't have a lot of interest in food, is surprised she has gained weight. Does eat healthy.   ROS positive for rare intermittent hematochezia (has a  history of hemorrhoids), insomnia.   Past Medical History:  Diagnosis Date  . Allergy   . Anemia   . Blood transfusion without reported diagnosis   . Bradycardia   . Cancer ([place])   . Cardiomyopathy ([place])   . CHF (congestive heart failure) ([place])   . Chronic pain   . Diabetes mellitus without complication ([place])   . GERD (gastroesophageal reflux disease)   . Heart murmur   . Migraines   . Sleep apnea     Past Surgical History:  Procedure Laterality Date  . ABDOMINAL HYSTERECTOMY  2016  . HERNIA REPAIR    . LAPAROSCOPY ABDOMEN DIAGNOSTIC    . LEEP    . PACEMAKER IMPLANT  2016  . TUBAL LIGATION  1999    Current Medications: Current Outpatient Medications on File Prior to Visit  Medication Sig  . acetaminophen (TYLENOL) 500 MG tablet Take by mouth.  . loratadine (CLARITIN) 10 MG tablet Take 1 tablet (10 mg total) by mouth daily.  . metoprolol succinate (TOPROL-XL) 25 MG 24 hr tablet Take 3 tablets (75 mg total) by mouth daily.  . potassium chloride (K-DUR) 10 MEQ tablet Take 1-2 tablets daily as directed  . spironolactone (ALDACTONE) 25 MG tablet Take 0.5 tablets (12.5 mg total) by mouth daily.  Marland Kitchen torsemide (DEMADEX) 10 MG tablet Take 1 tablet (10 mg total) by mouth daily.  . candesartan (ATACAND) 4 MG tablet Take 0.5 tablets (2 mg total) by mouth daily.   No current facility-administered medications on file prior to visit.  Allergies:   Patient has no known allergies.   Social History   Socioeconomic History  . Marital status: Single    Spouse name: Not on file  . Number of children: 5  . Years of education: 4 year college  . Highest education level: Bachelor's degree (e.g., BA, AB, BS)  Occupational History  . Not on file  Social Needs  . Financial resource strain: Not hard at all  . Food insecurity:    Worry: Never true    Inability: Never true  . Transportation needs:    Medical: No    Non-medical: No  Tobacco Use  . Smoking status: Never Smoker  .  Smokeless tobacco: Never Used  Substance and Sexual Activity  . Alcohol use: Never    Frequency: Never  . Drug use: Never  . Sexual activity: Not Currently    Birth control/protection: Surgical  Lifestyle  . Physical activity:    Days per week: 7 days    Minutes per session: 60 min  . Stress: To some extent  Relationships  . Social connections:    Talks on phone: More than three times a week    Gets together: More than three times a week    Attends religious service: More than 4 times per year    Active member of club or organization: Yes    Attends meetings of clubs or organizations: 1 to 4 times per year    Relationship status: Never married  Other Topics Concern  . Not on file  Social History Narrative   Lives with daughter and two grandkids in a manufactured home. Has steps into home (3). No pets. Likes to spend time with grandkids, reading, computer. Has smoke alarms, no throw rugs, no cords across floors. No grab bars.     Family History: The patient's family history includes Alcohol abuse in her father, mother, and sister; Diabetes in her daughter, maternal grandmother, and sister; Drug abuse in her father; Heart disease in her maternal grandmother and paternal grandmother; Hyperlipidemia in her maternal grandmother; Hypertension in her daughter; Liver disease in her mother. There is no history of Colon cancer, Esophageal cancer, Rectal cancer, or Stomach cancer.  Mother passed away age 49 from liver disease. Father passed away at age 25 from overdose. Grandparents passed in their 59s from cancer, MS, and heart disease. Brother living at age 78.   ROS:   Please see the history of present illness.  Additional pertinent ROS: Review of Systems  Constitutional: Negative for chills, fever and weight loss.  HENT: Negative for congestion, ear pain and sore throat.   Eyes: Negative for double vision and pain.  Respiratory: Positive for shortness of breath. Negative for cough and  sputum production.   Cardiovascular: Positive for chest pain. Negative for palpitations, orthopnea, claudication and leg swelling.  Gastrointestinal: Positive for blood in stool. Negative for abdominal pain, melena and vomiting.  Genitourinary: Negative for dysuria and hematuria.  Musculoskeletal: Positive for myalgias. Negative for falls.  Neurological: Negative for focal weakness, loss of consciousness and headaches.  Endo/Heme/Allergies: Does not bruise/bleed easily.  Psychiatric/Behavioral: The patient has insomnia.    EKGs/Labs/Other Studies Reviewed:    The following studies were reviewed today: Notes through [place] in initial consult note.   07/07/18 Study Conclusions - Stress ECG conclusions: The stress ECG was normal. This score   predicts a low risk of cardiac events. - Impressions: Normal stress echo  Impressions: - Normal study after maximal exercise. Normal stress echo  HCM treadmill stress echo 01/09/2017: CONCLUSIONS  Non-obstructive HCM.   Reduced exercise capacity for age (7.2 METs, 90% of age predicted).   Appropriate blood pressure response to exertion.   No evidence of exercise induced ischemia by echocardiography at a sub- adequate heart rate. Please note that the HCM echocardiogram is focusedon the assessment of hemodynamics (peak outflow tract gradient) at peakstress rather than wall motion analysis. Wall motion images are collectedlater after termination of stress and thus it is likely that ischemic findings will be missed at lower heart rates. Inducible ischemia at higher workloadscannot be ruled out.While no wall motion abnormalities were noted onthis study, this study was performed to evaluate LV outflow obstructionwith exercise rather than being optimized to evaluate for stress inducedwall motion.  Calculated gradients assume an LA pressure of 10 mmHg. Gradients maybe underestimated if the true peak MR velocity is not  captured. Directgradients may be overestimated if the Doppler signal is contaminated.  Treadmill Results  Protocol: Bruce Duration (min:sec):06:11 METS:  Stage:Amyl NitrateBP: (mmHg) JI:RCVELFYB:OFB HR: 131% MPHR 77  (mL inhaled)Sys Diast(bpm)    Baseline111 52 65  151 77 75  2 168 74 131  Peak 131  1 min recovery135 66 100  Recovery117 46 77  Recovery110 50 72   Reason for Termination:Dyspnea  Symptoms:Complained of shortness of breath and appeared dyspneic throughout exercise.  Recovery Time (min:sec):05:33   FINDINGS   EKG FINDINGS  Baseline:  Normal sinus rhythm, nonspecific T-wave changes.   Peak:  Sinus tachycardia with occasional PVCs, no ischemic EKG changes. METS:7.20  Hyperdynamic LV systolic function, LVEF estimated at 75-80%. All LVwalls augment appropriately.Near LV cavity obliteration. Peak LVOTgradient with exercise of 14 mmHg.Chordal SAM.  Recovery:  Normal sinus rhythm with frequent PVCs and ventricular couplet in earlyrecovery, no ischemic EKG changes.  Hyperdynamic LV systolic function, LVEF estimated at 70%.No regionalwall motion abnormalities. Chordal SAM.   Echo 01/09/2017 CONCLUSIONS   Normal left ventricular cavity size. Severe asymetric left ventricular hypertropy (septum 2.4 cm, posterior wall 1.1 cm).Increased (hyperdynamic) left ventricular ejection fraction. LVEF estimated at 70-75%.No regional wall motion abnormalities.Normal diastolic filling pattern for age.   Normal right ventricular size. Normal right ventricular global  systolic  function.    Systolic anterior motion of the mitral valve chordae. No significant LVOT gradient at rest or with Valsalva (< 10 mmHg); post-PVC gradient 14 mmHg.   No significant valve dysfunction.   EKG:  EKG is ordered today.  The ekg ordered today demonstrates sinus bradycardia  Recent Labs: 04/14/2018: BUN 11; Creatinine, Ser 0.80; Potassium 4.3; Sodium 140  Recent Lipid Panel No results found for: CHOL, TRIG, HDL, CHOLHDL, VLDL, LDLCALC, LDLDIRECT  Physical Exam:    VS:  BP 116/72   Pulse (!) 54   Ht 5\' 7"  (1.702 m)   Wt 282 lb 9.6 oz (128.2 kg)   BMI 44.26 kg/m     Wt Readings from Last 3 Encounters:  12/23/18 282 lb 9.6 oz (128.2 kg)  11/10/18 279 lb (126.6 kg)  08/05/18 275 lb 12.8 oz (125.1 kg)     GEN: Well nourished, well developed in no acute distress HEENT: Normal NECK: No JVD; No carotid bruits LYMPHATICS: No lymphadenopathy CARDIAC: regular rhythm, normal S1 and S2, no rubs or gallops. 1/6 systolic ejection murmur at LSB, increases slightly with Valsalva. Radial and DP pulses 2+ bilaterally. RESPIRATORY:  Clear to auscultation without rales, wheezing or rhonchi  ABDOMEN: Soft, non-tender, non-distended MUSCULOSKELETAL:  Trace bilateral LE edema; No deformity  SKIN: Warm  and dry NEUROLOGIC:  Alert and oriented x 3 PSYCHIATRIC:  Mildly depressed affect  ASSESSMENT:    1. Chest pain, unspecified type   2. Hypertrophic cardiomyopathy ([place])   3. Obstructive sleep apnea syndrome   4. Class 3 severe obesity with body mass index (BMI) of 40.0 to 44.9 in adult, unspecified obesity type, unspecified whether serious comorbidity present ([place])   5. Counseling on health promotion and disease prevention    PLAN:    Chest pain: same as at last visit, no evidence of ischemia or major obstruction noted on stress echo. Likely pleuritic/MSK in nature. With normal stress echo for the same symptoms, do not think this is likely to be ischemia or obstruction as  cause.  Hypertrophic cardiomyopathy:  -no red flags of syncope, family history of SCD  -stress echo done after last visit -previously followed at [facility], notes in care everywhere  Morbid obesity: weight is up slightly today. Endorses poor appetite, thinks she is eating healthy, so she is somewhat perturbed by the weight gain. May be beneficial to get her established with a behavioral health-type approach given her stress and need for weight loss.  Obstructive sleep apnea: recommended discussing with her PCP how she can contact company to get new equipment.  Prevention counseling -recommend heart healthy/Mediterranean diet, with whole grains, fruits, vegetable, fish, lean meats, nuts, and olive oil. Limit salt. -recommend moderate walking, 3-5 times/week for 30-50 minutes each session. Aim for at least 150 minutes.week. Goal should be pace of 3 miles/hours, or walking 1.5 miles in 30 minutes -recommend avoidance of tobacco products. Avoid excess alcohol.  Not addressed in depth today: Pacemaker: followed by Dr. Sallyanne Kuster  Plan for follow up: 6 mos or sooner PRN  TIME SPENT WITH PATIENT: 20 minutes of direct patient care. More than 50% of that time was spent on coordination of care and counseling regarding chest pain, lifestyle recommendations.  Buford Dresser, MD, PhD [place]  [facility]   Medication Adjustments/Labs and Tests Ordered: Current medicines are reviewed at length with the patient today.  Concerns regarding medicines are outlined above.  Orders Placed This Encounter  Procedures  . EKG 12-Lead   No orders of the defined types were placed in this encounter.   Patient Instructions  Medication Instructions:  Your Physician recommend you continue on your current medication as directed.    If you need a refill on your cardiac medications before your next appointment, please call your pharmacy.   Lab  work: None  Testing/Procedures: None  Follow-Up: At [facility], you and your health needs are our priority.  As part of our continuing mission to provide you with exceptional heart care, we have created designated Provider Care Teams.  These Care Teams include your primary Cardiologist (physician) and Advanced Practice Providers (APPs -  Physician Assistants and Nurse Practitioners) who all work together to provide you with the care you need, when you need it. You will need a follow up appointment in 6 months.  Please call our office 2 months in advance to schedule this appointment.  You may see Buford Dresser, MD or one of the following Advanced Practice Providers on your designated Care Team:   Rosaria Ferries, PA-C . Jory Sims, DNP, ANP       Signed, Buford Dresser, MD PhD 12/24/2018 1:30 PM    [place]

## 2018-12-23 NOTE — Progress Notes (Signed)
Subjective:   Patient ID: Sophia Wilkinson    DOB: February 20, 1966, 53 y.o. female   MRN: [id number]  Sophia Wilkinson is a 53 y.o. female with a history of CHF, HOCM, migraine, sleep apnea, obesity, prediabetes, SSS s/p pacemaker here for   Sleep apnea Patient had sleep study performed 07/10/18 with CPAP without significant event. Requests order for new tubing and supplies, supposed to get every 6 months. Hasn't been able to wear it recently due to this.  Bloody bowel movement Had BM, noticed blood with wiping in September x1, December x1, and a few weeks ago. Doesn't remember if she strained too hard. Sometimes will strain. Not painful. No abd pain, diarrhea.  Migraine Has h/o migraines, previously treated with amitriptyline with good effect when she was in [place] but has been a year since last used. Recently has been waking up with headaches with tingling on L side of face. Happened about 4x last week. Sleeps on lots of pillows. No fevers. No recent illnesses. No vision or speech changes. No confusion. Uses tylenol. Doesn't sleep well usually. Has tried melatonin.  Sciatic Pain Endorses L leg pain starting in hip/buttock that radiates down her leg. Sometimes will go numb. Sleeps on her side, mainly her R side. Sitting prolonged periods of time exacerbate pain though can have the pain in any position. No fevers. Will not drive for prolonged period due to fear of it going numb.  Depression Patient with decreased appetite, depressed mood, lack of energy ongoing since loss of son in car accident May 2018. She has been attending [employer] and coping mechanisms of prayer and family time but feels she is at the point she needs medication to help.  Review of Systems:  Per HPI.  [place], medications and smoking status reviewed.  Objective:   BP (!) 142/78   Temp 98.5 F (36.9 C) (Oral)   Wt 126.6 kg   BMI 43.70 kg/m  Vitals and nursing note reviewed.  General: morbidly obese female, in no acute  distress with non-toxic appearance CV: regular rate and rhythm without murmurs, rubs, or gallops Lungs: clear to auscultation bilaterally with normal work of breathing Abdomen: soft, non-tender, obese abdomen, no masses or organomegaly palpable, normoactive bowel sounds Skin: warm, dry, no rashes or lesions Extremities: warm and well perfused, normal tone MSK: Full ROM, strength 5/5 to U/LE bilaterally, deep reflexes intact, normal gait.  No edema. +L straight leg test Neuro: Alert and oriented, speech normal.  Romberg negative.  PERRL, Extraocular movements intact.  Intact symmetric sensation to light touch of face and extremities bilaterally (though "more sensitive on the L").  Hearing grossly intact bilaterally.  Tongue protrudes normally with no deviation.  Shoulder shrug, smile symmetric.   Depression screen [facility] 2/9 12/24/2018 07/14/2018 05/21/2018  Decreased Interest 2 0 0  Down, Depressed, Hopeless 2 1 0  PHQ - 2 Score 4 1 0  Altered sleeping 3 - -  Tired, decreased energy 3 - -  Change in appetite 1 - -  Feeling bad or failure about yourself  0 - -  Trouble concentrating 0 - -  Moving slowly or fidgety/restless 0 - -  Suicidal thoughts 0 - -  PHQ-9 Score 11 - -  Difficult doing work/chores Somewhat difficult - -    Assessment & Plan:   Migraine without aura Has noticed an increase in migraines recently, previously well controlled with amitriptyline. Lack of CPAP use could also be contributing. Neuro exam without focal deficits, no red flags in history  or exam to prompt imaging. Will order amitriptyline given previous good effect, also with concomittant depression since passing of her son. F/u in one month.  Sleep apnea Ordered DME CPAP supplies.  Obesity Contributing to OSA, difficulty sleeping, sciatic pain. Discussed lifestyle interventions through diet and exercise. Could consider referral to weight management clinic at next visit.  Hemorrhoids Likely cause of blood  visible on wiping. Also non-painful, likely internal. Advised patient to increase fiber and water intake. Informed patient of possible surgical referral for clipping if symptoms persist and are bothersome.  Sciatica L sided, no red flags on exam. +straight leg raise. Performed muscle energy technique to stretch tight hamstring. Advised stretching of hamstrings and gluteal muscles. Also encouraged weight loss to help ease pressure on scaitic nerve.  Depression PHQ 11 today. Given relief from migraines in the past with amitriptyline, will start in hopes of also treating depression. Advised continued counseling.  Should also consider obtaining TSH at next visit if not improved. F/u in one month.  Orders Placed This Encounter  Procedures  . For home use only DME continuous positive airway pressure (CPAP)    CPAP therapy on 10 cm H2O or DME autopap 5-15. Patient used a Technical sales engineer Pro Gel mask and heated humidification    Order Specific Question:   Patient has OSA or probable OSA    Answer:   Yes    Order Specific Question:   Is the patient currently using CPAP in the home    Answer:   Yes    Order Specific Question:   Settings    Answer:   5-10    Order Specific Question:   CPAP supplies needed    Answer:   Mask, headgear, cushions, filters, heated tubing and water chamber   Meds ordered this encounter  Medications  . amitriptyline (ELAVIL) 25 MG tablet    Sig: Take 1 tablet (25 mg total) by mouth at bedtime.    Dispense:  30 tablet    Refill:  0    Rory Percy, DO PGY-2, [place] [facility] 12/24/2018 6:07 PM

## 2018-12-23 NOTE — Patient Instructions (Signed)

## 2018-12-24 ENCOUNTER — Encounter: Payer: Self-pay | Admitting: Family Medicine

## 2018-12-24 ENCOUNTER — Other Ambulatory Visit: Payer: Self-pay

## 2018-12-24 ENCOUNTER — Ambulatory Visit (INDEPENDENT_AMBULATORY_CARE_PROVIDER_SITE_OTHER): Payer: Medicare Other | Admitting: Family Medicine

## 2018-12-24 VITALS — BP 142/78 | Temp 98.5°F | Wt 279.0 lb

## 2018-12-24 DIAGNOSIS — G4733 Obstructive sleep apnea (adult) (pediatric): Secondary | ICD-10-CM | POA: Diagnosis not present

## 2018-12-24 DIAGNOSIS — G43009 Migraine without aura, not intractable, without status migrainosus: Secondary | ICD-10-CM | POA: Diagnosis not present

## 2018-12-24 DIAGNOSIS — Z6841 Body Mass Index (BMI) 40.0 and over, adult: Secondary | ICD-10-CM

## 2018-12-24 DIAGNOSIS — M5432 Sciatica, left side: Secondary | ICD-10-CM | POA: Diagnosis not present

## 2018-12-24 DIAGNOSIS — M5430 Sciatica, unspecified side: Secondary | ICD-10-CM | POA: Insufficient documentation

## 2018-12-24 DIAGNOSIS — F321 Major depressive disorder, single episode, moderate: Secondary | ICD-10-CM

## 2018-12-24 DIAGNOSIS — K649 Unspecified hemorrhoids: Secondary | ICD-10-CM | POA: Insufficient documentation

## 2018-12-24 MED ORDER — AMITRIPTYLINE HCL 25 MG PO TABS: 25.0000 mg | ORAL_TABLET | Freq: Every day | ORAL | 0 refills | Status: DC

## 2018-12-24 NOTE — Assessment & Plan Note (Signed)
PHQ 11 today. Given relief from migraines in the past with amitriptyline, will start in hopes of also treating depression. Advised continued counseling.  Should also consider obtaining TSH at next visit if not improved. F/u in one month.

## 2018-12-24 NOTE — Assessment & Plan Note (Signed)
Likely cause of blood visible on wiping. Also non-painful, likely internal. Advised patient to increase fiber and water intake. Informed patient of possible surgical referral for clipping if symptoms persist and are bothersome.

## 2018-12-24 NOTE — Assessment & Plan Note (Signed)
Ordered DME CPAP supplies.

## 2018-12-24 NOTE — Assessment & Plan Note (Signed)
Contributing to OSA, difficulty sleeping, sciatic pain. Discussed lifestyle interventions through diet and exercise. Could consider referral to weight management clinic at next visit.

## 2018-12-24 NOTE — Assessment & Plan Note (Signed)
L sided, no red flags on exam. +straight leg raise. Performed muscle energy technique to stretch tight hamstring. Advised stretching of hamstrings and gluteal muscles. Also encouraged weight loss to help ease pressure on scaitic nerve.

## 2018-12-24 NOTE — Patient Instructions (Signed)
It was great to see you!  Our plans for today:  - We are starting amitriptyline. Come back in 1 month to see how this is working for you. - Continue to stretch your leg muscles as we showed you in clinic today. - I ordered your CPAP supplies. - I asked our lab tech about your Cologuard. I'll let you know what he says.   Take care and seek immediate care sooner if you develop any concerns.   Dr. [facility]

## 2018-12-24 NOTE — Assessment & Plan Note (Signed)
Has noticed an increase in migraines recently, previously well controlled with amitriptyline. Lack of CPAP use could also be contributing. Neuro exam without focal deficits, no red flags in history or exam to prompt imaging. Will order amitriptyline given previous good effect, also with concomittant depression since passing of her son. F/u in one month.

## 2018-12-31 ENCOUNTER — Telehealth: Payer: Self-pay | Admitting: Family Medicine

## 2018-12-31 ENCOUNTER — Encounter: Payer: Self-pay | Admitting: Family Medicine

## 2018-12-31 NOTE — Telephone Encounter (Signed)
Spoke to patient regarding last OV. After speaking with attending, some concern for blood per rectum and current cancer screening. Patient had Cologuard in 08/2018 which was normal however now that patient has had episodic bleeding per rectum, would need to be evaluated with colonoscopy. Patient has concerns regarding anesthesia/sedation and the amount of fluids she would have to drink for the bowel prep given her CHF. Advised her to make appt with GI to discuss other options.   She also stated she will send a MyChart message later today with her CPAP Rx and sleep study results she had done in [place] to be able to obtain CPAP supplies.

## 2019-01-03 ENCOUNTER — Other Ambulatory Visit: Payer: Self-pay | Admitting: Family Medicine

## 2019-01-03 DIAGNOSIS — G4733 Obstructive sleep apnea (adult) (pediatric): Secondary | ICD-10-CM

## 2019-01-03 NOTE — Progress Notes (Signed)
CPAP orders sent to [place] and letter faxed to [facility] with advanced home care stating patient's necessity for the supplies as she has been having difficulties obtaining them.  Rory Percy, DO PGY-2, [place] 01/03/2019 5:37 PM

## 2019-01-25 ENCOUNTER — Other Ambulatory Visit: Payer: Self-pay

## 2019-01-25 ENCOUNTER — Telehealth (INDEPENDENT_AMBULATORY_CARE_PROVIDER_SITE_OTHER): Payer: Medicare Other | Admitting: Family Medicine

## 2019-01-25 DIAGNOSIS — G4733 Obstructive sleep apnea (adult) (pediatric): Secondary | ICD-10-CM | POA: Diagnosis not present

## 2019-01-25 DIAGNOSIS — G43009 Migraine without aura, not intractable, without status migrainosus: Secondary | ICD-10-CM | POA: Diagnosis not present

## 2019-01-25 DIAGNOSIS — F321 Major depressive disorder, single episode, moderate: Secondary | ICD-10-CM

## 2019-01-25 MED ORDER — AMITRIPTYLINE HCL 25 MG PO TABS: 25.0000 mg | ORAL_TABLET | Freq: Every day | ORAL | 1 refills | Status: DC

## 2019-01-25 NOTE — Progress Notes (Signed)
[place] Tele[facility] Visit  Patient consented to have visit conducted via telephone.  Encounter participants: Patient: Sophia Wilkinson  Provider: Rory Percy  Others (if applicable): n/a  Chief Complaint: follow up for difficulty sleeping, migraines  HPI: Patient has been taking amitriptyline for the past month for difficulties sleeping, depression, and migraines.  Was previously on amitriptyline when she was in [place] with good effect, so trial was started at last visit 12/24/2018.  Patient has been taking amitriptyline without noted side effects.  For her difficulty sleeping, she feels like it is helping some.  She states she takes amitriptyline around 9:30 at night but notes sometimes she does not fall asleep until about 1 AM.  She has a regular bed time routine she follows every night with crocheting or reading her Bible before bed each night.  She gets into bed around 10-10:30 PM.  She states sometimes she does watch TV but does not feel like this has inhibited her ability to go to sleep.  She does feel like she gets about 8 hours of sleep and feels restful after waking up but is concerned about not being able to go to sleep until 1 AM some nights.  She has also got updated CPAP supplies which has also been helpful.  She notes having a decrease in the amount of her migraines as well, most notably not waking up with headaches anymore.  Regarding her depression, she states she "has seen more good days than bad."  PHQ 9 score today is 5, decreased from 11 at last visit.  Depression screen [facility] 2/9 01/25/2019 12/24/2018 07/14/2018 05/21/2018  Decreased Interest 1 2 0 0  Down, Depressed, Hopeless 1 2 1  0  PHQ - 2 Score 2 4 1  0  Altered sleeping 1 3 - -  Tired, decreased energy 1 3 - -  Change in appetite 1 1 - -  Feeling bad or failure about yourself  0 0 - -  Trouble concentrating 0 0 - -  Moving slowly or fidgety/restless 0 0 - -  Suicidal thoughts 0 0 - -  PHQ-9  Score 5 11 - -  Difficult doing work/chores - Somewhat difficult - -    ROS: Per HPI  Pertinent PMHx: OSA, depression, obesity, chronic migraine  Assessment/Plan:  Migraine without aura Improved with addition of nightly amitriptyline.  Also suspect some of these morning headaches may have been related to OSA given improvement with new CPAP supplies as well.  Will continue current dose of amitriptyline given improvement in depression as well.  Sleep apnea Improved with updated CPAP supplies although is canoeing to have some difficulties initiating sleep however improved with addition of amitriptyline.  Will back up nightly dose of amitriptyline to 2 hours prior to going to bed in hopes of being able to better initiate sleep.  We will follow-up in 1 month to see how this is going.  Refill for amitriptyline provided.  Depression Improved with addition of nightly amitriptyline.  PHQ 9 score 5, decreased from eleven 1 month prior.  No SI.  Continue to monitor.    Time spent on phone with patient: ~13 minutes

## 2019-01-25 NOTE — Assessment & Plan Note (Addendum)
Improved with updated CPAP supplies although is canoeing to have some difficulties initiating sleep however improved with addition of amitriptyline.  Will back up nightly dose of amitriptyline to 2 hours prior to going to bed in hopes of being able to better initiate sleep.  We will follow-up in 1 month to see how this is going.  Refill for amitriptyline provided.

## 2019-01-25 NOTE — Assessment & Plan Note (Signed)
Improved with addition of nightly amitriptyline.  PHQ 9 score 5, decreased from eleven 1 month prior.  No SI.  Continue to monitor.

## 2019-01-25 NOTE — Assessment & Plan Note (Addendum)
Improved with addition of nightly amitriptyline.  Also suspect some of these morning headaches may have been related to OSA given improvement with new CPAP supplies as well.  Will continue current dose of amitriptyline given improvement in depression as well.

## 2019-02-09 ENCOUNTER — Encounter: Payer: Medicare Other | Admitting: *Deleted

## 2019-02-09 ENCOUNTER — Telehealth: Payer: Self-pay

## 2019-02-09 ENCOUNTER — Other Ambulatory Visit: Payer: Self-pay

## 2019-02-09 NOTE — Telephone Encounter (Signed)
Spoke with patient to remind of missed remote transmission 

## 2019-02-10 ENCOUNTER — Other Ambulatory Visit: Payer: Self-pay | Admitting: Family Medicine

## 2019-02-14 ENCOUNTER — Other Ambulatory Visit: Payer: Self-pay | Admitting: Family Medicine

## 2019-03-21 ENCOUNTER — Other Ambulatory Visit: Payer: Self-pay | Admitting: Family Medicine

## 2019-03-21 DIAGNOSIS — Z1231 Encounter for screening mammogram for malignant neoplasm of breast: Secondary | ICD-10-CM

## 2019-03-21 NOTE — Progress Notes (Signed)
Called patient to schedule her medicare wellness visit but realized she isn't due until 07-2019.  Patient voiced understanding but states that she knows that she is due for her mammogram and would like the order to be placed.  Order placed and sent to pcp for cosign.  Roselinda Bahena,CMA

## 2019-05-13 ENCOUNTER — Other Ambulatory Visit: Payer: Self-pay | Admitting: Family Medicine

## 2019-05-13 ENCOUNTER — Encounter: Payer: Self-pay | Admitting: Family Medicine

## 2019-05-13 DIAGNOSIS — Z6841 Body Mass Index (BMI) 40.0 and over, adult: Secondary | ICD-10-CM

## 2019-05-13 DIAGNOSIS — R7303 Prediabetes: Secondary | ICD-10-CM

## 2019-05-13 DIAGNOSIS — D473 Essential (hemorrhagic) thrombocythemia: Secondary | ICD-10-CM

## 2019-05-13 DIAGNOSIS — D75839 Thrombocytosis, unspecified: Secondary | ICD-10-CM

## 2019-05-13 DIAGNOSIS — I509 Heart failure, unspecified: Secondary | ICD-10-CM

## 2019-05-13 MED ORDER — SPIRONOLACTONE 25 MG PO TABS: 12.5000 mg | ORAL_TABLET | Freq: Every day | ORAL | 3 refills | Status: DC

## 2019-05-13 MED ORDER — CANDESARTAN CILEXETIL 4 MG PO TABS: 2.0000 mg | ORAL_TABLET | Freq: Every day | ORAL | 1 refills | Status: DC

## 2019-05-13 MED ORDER — LORATADINE 10 MG PO TABS: 10.0000 mg | ORAL_TABLET | Freq: Every day | ORAL | 3 refills | Status: DC

## 2019-05-13 MED ORDER — AMITRIPTYLINE HCL 25 MG PO TABS: 25.0000 mg | ORAL_TABLET | Freq: Every day | ORAL | 1 refills | Status: DC

## 2019-05-13 MED ORDER — TORSEMIDE 10 MG PO TABS: 10.0000 mg | ORAL_TABLET | Freq: Every day | ORAL | 3 refills | Status: DC

## 2019-05-13 MED ORDER — POTASSIUM CHLORIDE ER 10 MEQ PO TBCR: EXTENDED_RELEASE_TABLET | ORAL | 1 refills | Status: DC

## 2019-05-13 MED ORDER — METOPROLOL SUCCINATE ER 25 MG PO TB24: 75.0000 mg | ORAL_TABLET | Freq: Every day | ORAL | 1 refills | Status: DC

## 2019-05-17 ENCOUNTER — Other Ambulatory Visit: Payer: Self-pay

## 2019-05-17 ENCOUNTER — Other Ambulatory Visit (INDEPENDENT_AMBULATORY_CARE_PROVIDER_SITE_OTHER): Payer: Medicare Other

## 2019-05-17 DIAGNOSIS — I509 Heart failure, unspecified: Secondary | ICD-10-CM | POA: Diagnosis not present

## 2019-05-17 DIAGNOSIS — D473 Essential (hemorrhagic) thrombocythemia: Secondary | ICD-10-CM | POA: Diagnosis not present

## 2019-05-17 DIAGNOSIS — R7303 Prediabetes: Secondary | ICD-10-CM | POA: Diagnosis not present

## 2019-05-17 DIAGNOSIS — D75839 Thrombocytosis, unspecified: Secondary | ICD-10-CM

## 2019-05-17 DIAGNOSIS — Z6841 Body Mass Index (BMI) 40.0 and over, adult: Secondary | ICD-10-CM | POA: Diagnosis not present

## 2019-05-17 LAB — POCT GLYCOSYLATED HEMOGLOBIN (HGB A1C): HbA1c, POC (prediabetic range): 6.4 % (ref 5.7–6.4)

## 2019-05-18 ENCOUNTER — Ambulatory Visit (INDEPENDENT_AMBULATORY_CARE_PROVIDER_SITE_OTHER): Payer: Medicare Other | Admitting: *Deleted

## 2019-05-18 DIAGNOSIS — I495 Sick sinus syndrome: Secondary | ICD-10-CM | POA: Diagnosis not present

## 2019-05-18 LAB — BASIC METABOLIC PANEL
BUN/Creatinine Ratio: 10 (ref 9–23)
BUN: 8 mg/dL (ref 6–24)
CO2: 26 mmol/L (ref 20–29)
Calcium: 9.9 mg/dL (ref 8.7–10.2)
Chloride: 100 mmol/L (ref 96–106)
Creatinine, Ser: 0.8 mg/dL (ref 0.57–1.00)
GFR calc Af Amer: 99 mL/min/{1.73_m2} (ref >59)
GFR calc non Af Amer: 86 mL/min/{1.73_m2} (ref >59)
Glucose: 201 mg/dL — ABNORMAL HIGH (ref 65–99)
Potassium: 4.4 mmol/L (ref 3.5–5.2)
Sodium: 141 mmol/L (ref 134–144)

## 2019-05-18 LAB — CUP PACEART REMOTE DEVICE CHECK
Battery Remaining Percentage: 95 %
Battery Voltage: 2.99 V
Brady Statistic RA Percent Paced: 3 %
Brady Statistic RV Percent Paced: 1.1 %
Date Time Interrogation Session: 20200715192042
Implantable Lead Implant Date: 20160913
Implantable Lead Implant Date: 20160913
Implantable Lead Location: 753859
Implantable Lead Location: 753860
Implantable Lead Model: 1948
Implantable Pulse Generator Implant Date: 20160913
Lead Channel Impedance Value: 400 Ohm
Lead Channel Impedance Value: 560 Ohm
Lead Channel Pacing Threshold Amplitude: 0.625 V
Lead Channel Pacing Threshold Amplitude: 0.875 V
Lead Channel Pacing Threshold Pulse Width: 0.4 ms
Lead Channel Pacing Threshold Pulse Width: 0.4 ms
Lead Channel Sensing Intrinsic Amplitude: 2.6 mV
Lead Channel Sensing Intrinsic Amplitude: 5.4 mV
Lead Channel Setting Pacing Amplitude: 1 V
Lead Channel Setting Pacing Amplitude: 1.625
Lead Channel Setting Pacing Pulse Width: 0.4 ms
Lead Channel Setting Sensing Sensitivity: 0.7 mV
Pulse Gen Model: 2240
Pulse Gen Serial Number: 7676319

## 2019-05-18 LAB — CBC
Hematocrit: 40.7 % (ref 34.0–46.6)
Hemoglobin: 13.1 g/dL (ref 11.1–15.9)
MCH: 27.5 pg (ref 26.6–33.0)
MCHC: 32.2 g/dL (ref 31.5–35.7)
MCV: 86 fL (ref 79–97)
Platelets: 329 10*3/uL (ref 150–450)
RBC: 4.76 x10E6/uL (ref 3.77–5.28)
RDW: 13.6 % (ref 11.7–15.4)
WBC: 6.4 10*3/uL (ref 3.4–10.8)

## 2019-05-23 ENCOUNTER — Other Ambulatory Visit: Payer: Self-pay

## 2019-05-23 ENCOUNTER — Ambulatory Visit (INDEPENDENT_AMBULATORY_CARE_PROVIDER_SITE_OTHER): Payer: Medicare Other | Admitting: Family Medicine

## 2019-05-23 ENCOUNTER — Encounter: Payer: Self-pay | Admitting: Family Medicine

## 2019-05-23 VITALS — BP 126/68 | HR 74

## 2019-05-23 DIAGNOSIS — I5032 Chronic diastolic (congestive) heart failure: Secondary | ICD-10-CM

## 2019-05-23 DIAGNOSIS — E119 Type 2 diabetes mellitus without complications: Secondary | ICD-10-CM | POA: Diagnosis not present

## 2019-05-23 DIAGNOSIS — G479 Sleep disorder, unspecified: Secondary | ICD-10-CM

## 2019-05-23 DIAGNOSIS — Z6841 Body Mass Index (BMI) 40.0 and over, adult: Secondary | ICD-10-CM

## 2019-05-23 DIAGNOSIS — R5383 Other fatigue: Secondary | ICD-10-CM

## 2019-05-23 DIAGNOSIS — I422 Other hypertrophic cardiomyopathy: Secondary | ICD-10-CM

## 2019-05-23 DIAGNOSIS — R7303 Prediabetes: Secondary | ICD-10-CM

## 2019-05-23 MED ORDER — ROSUVASTATIN CALCIUM 5 MG PO TABS: 5.0000 mg | ORAL_TABLET | Freq: Every day | ORAL | 3 refills | Status: DC

## 2019-05-23 MED ORDER — EMPAGLIFLOZIN 10 MG PO TABS: 10.0000 mg | ORAL_TABLET | Freq: Every day | ORAL | 0 refills | Status: DC

## 2019-05-23 NOTE — Progress Notes (Signed)
Subjective:   Patient ID: Sophia Wilkinson    DOB: April 06, 1966, 53 y.o. female   MRN: [id number]  Sophia Wilkinson is a 53 y.o. female with a history of diastolic CHF, HCM, pacemaker, migraine, OSA on CPAP, obesity, depression here for   Prediabetes - Last A1c 6.4 05/2019 (elevated from 6.1 in 2019) - Medications: none, previously on metformin did not tolerate. - Compliance: n/a - Checking BG at home: no - not currently trying to lose weight - Statin: no - Denies symptoms of hypoglycemia, polyuria, numbness extremities, foot ulcers/trauma - having some polydipsia, taking torsemide. Will have some sciatic tingling.  - grandmother and daughter had diabetes - never smoker  Difficulties sleeping  - improved with new CPAP supplies, although isn't using CPAP lately.  - taking amitriptyline 5:30-6pm.  - Goes to sleep around 1mn and will crochet. Sometimes will watch videos on her phone.  Obesity - not currently trying to lose weight - feeling sluggish and tired recently.  - Diet: had tried to cut out starches and red meat, eating more vegetables. Lean meats only. Has started drinking sodas again.  - Exercise: was walking but hasn't since it got too hot. Was doing a mile per day. - has never seen nutrition  Review of Systems:  Per HPI.  [place], medications and smoking status reviewed.  Objective:   BP 126/68   Pulse 74  Vitals and nursing note reviewed.  General: obese female, in no acute distress with non-toxic appearance CV: regular rate and rhythm without murmurs, rubs, or gallops, no lower extremity edema Lungs: clear to auscultation bilaterally with normal work of breathing Abdomen: soft, non-tender, obese abdomen, no masses or organomegaly palpable, normoactive bowel sounds Skin: warm, dry, no rashes or lesions Extremities: warm and well perfused, normal tone MSK: ROM grossly intact, strength intact, gait normal Neuro: Alert and oriented, speech normal  Depression screen [facility]  2/9 05/23/2019 05/23/2019 05/23/2019 01/25/2019 12/24/2018  Decreased Interest 1 1 0 1 2  Down, Depressed, Hopeless 1 1 0 1 2  PHQ - 2 Score 2 2 0 2 4  Altered sleeping 1 1 - 1 3  Tired, decreased energy 1 1 - 1 3  Change in appetite 1 1 - 1 1  Feeling bad or failure about yourself  0 0 - 0 0  Trouble concentrating 0 0 - 0 0  Moving slowly or fidgety/restless 0 0 - 0 0  Suicidal thoughts 0 0 - 0 0  PHQ-9 Score 5 5 - 5 11  Difficult doing work/chores Somewhat difficult Somewhat difficult - - Somewhat difficult     Assessment & Plan:   Obesity Contributing to prediabetes and OSA. Counseled on lifestyle changes through diet and exercise. Referral made to nutrition.  Sleeping difficulties Ongoing.  Is not wearing CPAP consistently, so likely contributing to some degree.  Also has history of depression with current COVID pandemic as major stressor.  PHQ 9 completed today, underwhelming and unchanged from prior.  Taking amitriptyline with good effect.  Did discuss increase in dose for amitriptyline but does report she feels groggy sometimes in the morning.  Believe this is likely due to not initiating sleep until midnight or 1 AM rather than amitriptyline given she is taking it at 530 or 6 PM.  Sleep hygiene reviewed.  CBC and BMP previously obtained without notable metabolic abnormalities.  Glucose was elevated in the 200s, this could also contribute.  We will also obtain TSH today, no thyromegaly on exam.  After shared decision  making, will have patient work on CPAP use consistency as well as sleep hygiene and diet and exercise changes.  We will follow-up in 1 month to see how she is doing.  Prediabetes A1c today 6.4.  Previously intolerant of metformin.  Discussed lifestyle changes through diet and exercise.  Referral made to nutrition.  Advised patient if she is able to lose as much as 10 pounds, may can avoid progression to diabetes though does have strong family history.  Will obtain lipid panel  today.  Follow-up in 3 months for repeat A1c.  Orders Placed This Encounter  Procedures  . Lipid Panel  . TSH  . Amb ref to Medical Nutrition Therapy-MNT    Referral Priority:   Routine    Referral Type:   Consultation    Referral Reason:   Specialty Services Required    Requested Specialty:   Nutrition    Number of Visits Requested:   1   Rory Percy, DO PGY-3, [facility] 05/24/2019 11:09 AM

## 2019-05-23 NOTE — Patient Instructions (Addendum)
It was great to see you!  Our plans for today:  -We started a new medication for your diabetes.  Let us know if you do not tolerate this. -We also started a medication for cholesterol.   - Please call our nutritionist, Dr. Jenne Campus, to set up an appointment. [phone]. -Come back in 4 weeks to see how you are doing.  We are checking some labs today, we will call you or send you a letter if they are abnormal.   Take care and seek immediate care sooner if you develop any concerns.   Dr. [facility]

## 2019-05-24 DIAGNOSIS — G479 Sleep disorder, unspecified: Secondary | ICD-10-CM | POA: Insufficient documentation

## 2019-05-24 LAB — LIPID PANEL
Chol/HDL Ratio: 4.8 ratio — ABNORMAL HIGH (ref 0.0–4.4)
Cholesterol, Total: 213 mg/dL — ABNORMAL HIGH (ref 100–199)
HDL: 44 mg/dL (ref >39)
LDL Calculated: 139 mg/dL — ABNORMAL HIGH (ref 0–99)
Triglycerides: 152 mg/dL — ABNORMAL HIGH (ref 0–149)
VLDL Cholesterol Cal: 30 mg/dL (ref 5–40)

## 2019-05-24 LAB — TSH: TSH: 1.39 u[IU]/mL (ref 0.450–4.500)

## 2019-05-24 NOTE — Assessment & Plan Note (Addendum)
Ongoing.  Is not wearing CPAP consistently, so likely contributing to some degree.  Also has history of depression with current COVID pandemic as major stressor.  PHQ 9 completed today, underwhelming and unchanged from prior.  Taking amitriptyline with good effect.  Did discuss increase in dose for amitriptyline but does report she feels groggy sometimes in the morning.  Believe this is likely due to not initiating sleep until midnight or 1 AM rather than amitriptyline given she is taking it at 530 or 6 PM.  Sleep hygiene reviewed.  CBC and BMP previously obtained without notable metabolic abnormalities.  Glucose was elevated in the 200s, this could also contribute.  We will also obtain TSH today, no thyromegaly on exam.  After shared decision making, will have patient work on CPAP use consistency as well as sleep hygiene and diet and exercise changes.  We will follow-up in 1 month to see how she is doing.

## 2019-05-24 NOTE — Assessment & Plan Note (Addendum)
A1c today 6.4.  Previously intolerant of metformin.  Discussed lifestyle changes through diet and exercise.  Referral made to nutrition.  Advised patient if she is able to lose as much as 10 pounds, may can avoid progression to diabetes though does have strong family history.  Will obtain lipid panel today.  Follow-up in 3 months for repeat A1c.

## 2019-05-24 NOTE — Assessment & Plan Note (Addendum)
Contributing to prediabetes and OSA. Counseled on lifestyle changes through diet and exercise. Referral made to nutrition.

## 2019-05-28 ENCOUNTER — Encounter: Payer: Self-pay | Admitting: Cardiology

## 2019-05-28 NOTE — Progress Notes (Signed)
Remote pacemaker transmission.   

## 2019-06-13 ENCOUNTER — Telehealth: Payer: Medicare Other | Admitting: Family Medicine

## 2019-06-16 ENCOUNTER — Telehealth (INDEPENDENT_AMBULATORY_CARE_PROVIDER_SITE_OTHER): Payer: Medicare Other | Admitting: Family Medicine

## 2019-06-16 ENCOUNTER — Other Ambulatory Visit: Payer: Self-pay

## 2019-06-16 DIAGNOSIS — Z6841 Body Mass Index (BMI) 40.0 and over, adult: Secondary | ICD-10-CM | POA: Diagnosis not present

## 2019-06-16 DIAGNOSIS — R7303 Prediabetes: Secondary | ICD-10-CM

## 2019-06-16 NOTE — Patient Instructions (Addendum)
  Diet Recommendations for Diabetes   Starchy (carb) foods: Bread, rice, pasta, potatoes, corn, cereal, grits, crackers, bagels, muffins, all baked goods.  (Fruits, milk, and yogurt also have carbohydrate, but most of these foods will not spike your blood sugar as the starchy foods will.)  A few fruits do cause high blood sugars; use small portions of bananas (limit to 1/2 at a time), grapes, watermelon, oranges, and most tropical fruits.    Protein foods: Meat, fish, poultry, eggs, dairy foods, and beans such as pinto and kidney beans (beans also provide carbohydrate).   1. Eat at least 3 meals and 1-2 snacks per day. Never go more than 4-5 hours while awake without eating. Eat breakfast within the first hour of getting up.   2. Limit starchy foods to TWO per meal and ONE per snack. ONE portion of a starchy  food is equal to the following:   - ONE slice of bread (or its equivalent, such as half of a hamburger bun).   - 1/2 cup of a "scoopable" starchy food such as potatoes or rice.   - 15 grams of carbohydrate as shown on food label.  3. Include at every meal: a protein food, a carb food, and vegetables and/or fruit.   - Obtain twice the volume of veg's as protein or carbohydrate foods for both lunch and dinner.   - Fresh or frozen veg's are best.   - Keep frozen veg's on hand for a quick vegetable serving.       Goals:  1. Drink 48oz of water a day. Can have sweet tea or soda once or twice a week. Try using diet tea or diet soda instead of full sugar forms.  2. At least one meal a day having 1/2 vegetable, 1/4 protein, 1/4 carb 3. Keeping track of goals on a calender: writing down # of oz of water and if you meet goal of one meal a day with 1/2 veg, 1/4 protein, 1/4 carb.    Follow-up: Follow up in 4 weeks with Dr. Jenne Campus, 07/14/19 @10AM . Dr. Jenne Campus will confirm appointment time via Email

## 2019-06-16 NOTE — Progress Notes (Signed)
I connected with Sophia Wilkinson (MRN [id number]) on 8/13/2020by video-enabled telemedicine application Zoom, and verified that I am speaking with the correct person using two identifiers. HIPAA-compliant telemedicine application, verified that I am speaking with the correct person using two identifiers, and that the patient was in a private environment conducive to confidentiality.  I discussed the limitations of evaluation and management by telemedicine. The patient expressed understanding and agreed to proceed.  I discussed the limitations of evaluation and management by telemedicine. The patient expressed understanding and agreed to proceed.  Provider was Kennith Center, PhD, RD, LDN, CEDRD, and resident Caroline More, DO, PGY-3 Provider(s) located at home & [facility] during this phone encounter; patient was at home  Session Time:  10-10:36AM (36 minutes)  Reason for telephone visit: Initiation of Medical Nutrition Therapy for obesity  Relevant history/background:  Patient was seen by her PCP on 7/20 for prediabetes and obesity.  At that time it was discussed that patient was interested in seeing medical nutrition therapy.  PMHx is significant for diastolic HF, OSA, Obesity, and pre-diabetes (last A1C 6.4). Last charted weight 279lbs (BMI 43.7) on 12/24/2018.  Reports she has been disappointed with her weight.  Assessment: Patient reports her primary goal of seeing medical nutrition therapy is to try and lose weight and to get her A1c down so she does not have to start any medication for diabetes.  States that overall she does not have much of an appetite.  Has concerns about her sleep as she is only getting 2 to 3 hours of sleep at night sometimes 4.  States that she is groggy throughout most the day.  Also reports that her exercise has been down recently due to coronavirus.  Used to go to stores regularly to and walk around to to get exercise but has not been able to do so recently.  Patient  reports she is very motivated and is a determined person so if she sets herself to a goal she will keep it.  She states starting on Monday she will try and improve her diet and exercise to lose weight and decrease her A1c.  Patient reports that recently she has been thinking about her son more which caused her to not stick to her diet goals.  Her son passed away 2 years ago which is still causing her some distress.  Patient reports that she is very tired.  Is not eating as many vegetables and eating more fast food when she thinks about her son.  Usual eating pattern: 1-2 meals and not that many snacks per day. Frequent foods and beverages: uses water with crystal lite flavor packets. Every now and again soda. Sweet tea. Usually eats fish, chicken, and [place], but now has beef now and then  Avoided foods: doesn't eat that much starch, only brown rice. Decreased mashed potatoes .   Usual physical activity: 1-2x/week, goes to store and walk around for 30-45 min. Used to go to cardiac rehab so was told to walk flat surfaces. Sleep: horrible, can't sleep at night. Takes amitriptyline. Doesn't fall asleep in timely matter. During the day is groggy. . Gets ~4hrs. Uses CPAP but needs to have it fixed.   24-hr recall  (Up at  9-9:30 AM) B ( AM)- none   Snk ( AM)- none   L ( 3:30-4PM)- cookout cheeseburger with mayo, lettuce, tomato, ketchup, mustard, 8-10 french fries, 16 oz pineapple banana milkshake    Snk ( PM)- none  D (7:30-8 PM)- 1.5Cchicken ravioli  with pesto sauce with 1/2C Parmesan and alfredo cheese on top    Snk ( PM)- piece of cake  Typical day? Yes.    Throughout the day had 2 16oz sweet tea and 1 16 oz club soda  Intervention:  Overall patient is hoping to incorporate more chicken, fish, [place] with large portion of vegetables.  Trying to cut out her starches.  Is wanting to decrease her soda and tea intake and drink more water, replace tea and soda with diet options.  Diabetic diet  information placed in AVS and discussed with patient.  There was also concern about possible depression given that her diet is very related to her feelings when she thinks about her son's death.  I have sent a message to patient's PCP to follow this up.  Goals:  1. Incorporating more water, decreasing sweet teas and sodas. Drink 48oz of water a day. Can have sweet tea or soda once or twice a week. Try using diet tea or diet soda instead of full sugar forms.  2. At least one meal a day having 1/2 vegetable, 1/4 protein, 1/4 carb 3. Keeping track of goals on a calender: writing down # of oz of water and if you meet goal of one meal a day with 1/2 veg, 1/4 protein, 1/4 carb.    Follow-up: Follow up in 4 weeks with Dr. Jenne Campus, 07/14/19 @10AM . Dr. Jenne Campus will confirm appointment time via Email  Caroline More, DO, PGY-3 [facility] 06/16/2019 10:53 AM

## 2019-06-20 ENCOUNTER — Ambulatory Visit: Payer: Medicare Other | Admitting: Cardiology

## 2019-06-21 ENCOUNTER — Ambulatory Visit (INDEPENDENT_AMBULATORY_CARE_PROVIDER_SITE_OTHER): Payer: Medicare Other | Admitting: Cardiology

## 2019-06-21 ENCOUNTER — Encounter: Payer: Self-pay | Admitting: Cardiology

## 2019-06-21 ENCOUNTER — Other Ambulatory Visit: Payer: Self-pay

## 2019-06-21 VITALS — BP 127/83 | HR 61 | Temp 97.6°F | Ht 66.0 in | Wt 285.6 lb

## 2019-06-21 DIAGNOSIS — Z9989 Dependence on other enabling machines and devices: Secondary | ICD-10-CM

## 2019-06-21 DIAGNOSIS — Z713 Dietary counseling and surveillance: Secondary | ICD-10-CM

## 2019-06-21 DIAGNOSIS — R0789 Other chest pain: Secondary | ICD-10-CM | POA: Diagnosis not present

## 2019-06-21 DIAGNOSIS — F321 Major depressive disorder, single episode, moderate: Secondary | ICD-10-CM

## 2019-06-21 DIAGNOSIS — I5032 Chronic diastolic (congestive) heart failure: Secondary | ICD-10-CM

## 2019-06-21 DIAGNOSIS — I422 Other hypertrophic cardiomyopathy: Secondary | ICD-10-CM

## 2019-06-21 DIAGNOSIS — I10 Essential (primary) hypertension: Secondary | ICD-10-CM

## 2019-06-21 DIAGNOSIS — G4733 Obstructive sleep apnea (adult) (pediatric): Secondary | ICD-10-CM

## 2019-06-21 DIAGNOSIS — Z6841 Body Mass Index (BMI) 40.0 and over, adult: Secondary | ICD-10-CM

## 2019-06-21 DIAGNOSIS — Z7182 Exercise counseling: Secondary | ICD-10-CM

## 2019-06-21 NOTE — Progress Notes (Signed)
Cardiology Office Note:    Date:  06/21/2019   ID:  Sophia Wilkinson, DOB 17-Jul-1966, MRN [id number]  PCP:  Rory Percy, DO  Cardiologist:  Buford Dresser, MD PhD Pacemaker: Dr. Sallyanne Kuster  Referring MD: Rory Percy, DO   CC: follow up  History of Present Illness:    Sophia Wilkinson is a 53 y.o. female with a hx of hypertrophic cardiomyopathy who is seen in followup today. She was initially seen as a new consult at the request of Dr. Ky Barban for evaluation and management of heart failure on 06/18/18.   Cardiac history: She has a past medical history of hypertrophic cardiomyopathy without significant obstruction, chronic diastolic heart failure, morbid obesity, obstructive sleep apnea on CPAP, and bradycardia s/p PPM. She had been followed by [facility] until moving to [facility] 11/2017. She denies family history of sudden cardiac death. No syncope. No known history of NSVT/VT. Last [facility] note summarized in initial consult note.  Today:   Still with rare chest pain, same as quality noted before (had stress test). Occasionally feels bloated, has gained about 15 lbs with coronavirus. Had some family time for her birthday but made her miss her son. Her mom died right before her 56nd birthday, which has been on her mind.  Has felt somewhat down. Noted that she has had decreased appetite. Staying in the house most of the time with Covid, tries to safely get out on occasion to help her mood. Shortness of breath unchanged. Can walk short distances, sometimes needs to stop and rest.   Sleeps on 12 pillows at baseline. Got CPAP fixed, now broken again, planning to get fixed again today. No significant LE edema, does feel like her fingers get tight sometimes.   Past Medical History:  Diagnosis Date  . Allergy   . Anemia   . Blood transfusion without reported diagnosis   . Bradycardia   . Cancer ([place])   . Cardiomyopathy ([place])   . CHF (congestive heart failure) ([place])   . Chronic pain    . Diabetes mellitus without complication ([place])   . GERD (gastroesophageal reflux disease)   . Heart murmur   . Migraines   . Sleep apnea     Past Surgical History:  Procedure Laterality Date  . ABDOMINAL HYSTERECTOMY  2016  . HERNIA REPAIR    . LAPAROSCOPY ABDOMEN DIAGNOSTIC    . LEEP    . PACEMAKER IMPLANT  2016  . TUBAL LIGATION  1999    Current Medications: Current Outpatient Medications on File Prior to Visit  Medication Sig  . acetaminophen (TYLENOL) 500 MG tablet Take by mouth.  Marland Kitchen amitriptyline (ELAVIL) 25 MG tablet Take 1 tablet (25 mg total) by mouth at bedtime.  . candesartan (ATACAND) 4 MG tablet Take 0.5 tablets (2 mg total) by mouth daily.  Marland Kitchen loratadine (CLARITIN) 10 MG tablet Take 1 tablet (10 mg total) by mouth daily.  . metoprolol succinate (TOPROL-XL) 25 MG 24 hr tablet Take 3 tablets (75 mg total) by mouth daily.  . potassium chloride (K-DUR) 10 MEQ tablet Take 1-2 tablets daily as directed  . spironolactone (ALDACTONE) 25 MG tablet Take 0.5 tablets (12.5 mg total) by mouth daily.  Marland Kitchen torsemide (DEMADEX) 10 MG tablet Take 1 tablet (10 mg total) by mouth daily.   No current facility-administered medications on file prior to visit.      Allergies:   Patient has no known allergies.   Social History   Socioeconomic History  . Marital status: Single  Spouse name: Not on file  . Number of children: 5  . Years of education: 4 year college  . Highest education level: Bachelor's degree (e.g., BA, AB, BS)  Occupational History  . Not on file  Social Needs  . Financial resource strain: Not hard at all  . Food insecurity    Worry: Never true    Inability: Never true  . Transportation needs    Medical: No    Non-medical: No  Tobacco Use  . Smoking status: Never Smoker  . Smokeless tobacco: Never Used  Substance and Sexual Activity  . Alcohol use: Never    Frequency: Never  . Drug use: Never  . Sexual activity: Not Currently    Birth  control/protection: Surgical  Lifestyle  . Physical activity    Days per week: 7 days    Minutes per session: 60 min  . Stress: To some extent  Relationships  . Social connections    Talks on phone: More than three times a week    Gets together: More than three times a week    Attends religious service: More than 4 times per year    Active member of club or organization: Yes    Attends meetings of clubs or organizations: 1 to 4 times per year    Relationship status: Never married  Other Topics Concern  . Not on file  Social History Narrative   Lives with daughter and two grandkids in a manufactured home. Has steps into home (3). No pets. Likes to spend time with grandkids, reading, computer. Has smoke alarms, no throw rugs, no cords across floors. No grab bars.     Family History: The patient's family history includes Alcohol abuse in her father, mother, and sister; Diabetes in her daughter, maternal grandmother, and sister; Drug abuse in her father; Heart disease in her maternal grandmother and paternal grandmother; Hyperlipidemia in her maternal grandmother; Hypertension in her daughter; Liver disease in her mother. There is no history of Colon cancer, Esophageal cancer, Rectal cancer, or Stomach cancer.  Mother passed away age 35 from liver disease. Father passed away at age 58 from overdose. Grandparents passed in their 65s from cancer, MS, and heart disease. Brother living at age 20.   ROS:   Please see the history of present illness.  Additional pertinent ROS: Constitutional: Negative for chills, fever, night sweats, unintentional weight loss. Positive for weight gain HENT: Negative for ear pain and hearing loss.   Eyes: Negative for loss of vision and eye pain.  Respiratory: Negative for cough, sputum, wheezing.   Cardiovascular: See HPI. Gastrointestinal: Negative for abdominal pain, melena, and hematochezia.  Genitourinary: Negative for dysuria and hematuria.   Musculoskeletal: Negative for falls and myalgias.  Skin: Negative for itching and rash.  Neurological: Negative for focal weakness, focal sensory changes and loss of consciousness.  Endo/Heme/Allergies: Does not bruise/bleed easily.   EKGs/Labs/Other Studies Reviewed:    The following studies were reviewed today: Notes through [place] in initial consult note.   07/07/18 Study Conclusions - Stress ECG conclusions: The stress ECG was normal. This score   predicts a low risk of cardiac events. - Impressions: Normal stress echo  Impressions: - Normal study after maximal exercise. Normal stress echo  HCM treadmill stress echo 01/09/2017: CONCLUSIONS  Non-obstructive HCM.   Reduced exercise capacity for age (7.2 METs, 90% of age predicted).   Appropriate blood pressure response to exertion.   No evidence of exercise induced ischemia by echocardiography at a  sub- adequate heart rate. Please note that the HCM echocardiogram is focusedon the assessment of hemodynamics (peak outflow tract gradient) at peakstress rather than wall motion analysis. Wall motion images are collectedlater after termination of stress and thus it is likely that ischemic findings will be missed at lower heart rates. Inducible ischemia at higher workloadscannot be ruled out.While no wall motion abnormalities were noted onthis study, this study was performed to evaluate LV outflow obstructionwith exercise rather than being optimized to evaluate for stress inducedwall motion.  Calculated gradients assume an LA pressure of 10 mmHg. Gradients maybe underestimated if the true peak MR velocity is not captured. Directgradients may be overestimated if the Doppler signal is contaminated.  Treadmill Results  Protocol: Bruce Duration (min:sec):06:11 METS:  Stage:Amyl NitrateBP: (mmHg) YC:XKGYJEHU:DJS HR: 131% MPHR 77   (mL inhaled)Sys Diast(bpm)    Baseline111 52 65  151 77 75  2 168 74 131  Peak 131  1 min recovery135 66 100  Recovery117 46 77  Recovery110 50 72   Reason for Termination:Dyspnea  Symptoms:Complained of shortness of breath and appeared dyspneic throughout exercise.  Recovery Time (min:sec):05:33   FINDINGS   EKG FINDINGS  Baseline:  Normal sinus rhythm, nonspecific T-wave changes.   Peak:  Sinus tachycardia with occasional PVCs, no ischemic EKG changes. METS:7.20  Hyperdynamic LV systolic function, LVEF estimated at 75-80%. All LVwalls augment appropriately.Near LV cavity obliteration. Peak LVOTgradient with exercise of 14 mmHg.Chordal SAM.  Recovery:  Normal sinus rhythm with frequent PVCs and ventricular couplet in earlyrecovery, no ischemic EKG changes.  Hyperdynamic LV systolic function, LVEF estimated at 70%.No regionalwall motion abnormalities. Chordal SAM.   Echo 01/09/2017 CONCLUSIONS   Normal left ventricular cavity size. Severe asymetric left ventricular hypertropy (septum 2.4 cm, posterior wall 1.1 cm).Increased (hyperdynamic) left ventricular ejection fraction. LVEF estimated at 70-75%.No regional wall motion abnormalities.Normal diastolic filling pattern for age.   Normal right ventricular size. Normal right ventricular global  systolic function.    Systolic anterior motion of the mitral valve chordae. No significant LVOT gradient at rest or with Valsalva (< 10 mmHg); post-PVC gradient 14 mmHg.   No significant valve dysfunction.   EKG:  EKG is ordered today.  The ekg ordered 12/23/18 demonstrates sinus bradycardia   Recent Labs: 05/17/2019: BUN 8; Creatinine, Ser 0.80; Hemoglobin 13.1; Platelets 329; Potassium 4.4; Sodium 141 05/23/2019: TSH 1.390  Recent Lipid Panel    Component Value Date/Time   CHOL 213 (H) 05/23/2019 1547   TRIG 152 (H) 05/23/2019 1547   HDL 44 05/23/2019 1547   CHOLHDL 4.8 (H) 05/23/2019 1547   LDLCALC 139 (H) 05/23/2019 1547    Physical Exam:    VS:  BP 127/83   Pulse 61   Temp 97.6 F (36.4 C)   Ht 5\' 6"  (1.676 m)   Wt 285 lb 9.6 oz (129.5 kg)   SpO2 96%   BMI 46.10 kg/m     Wt Readings from Last 3 Encounters:  06/21/19 285 lb 9.6 oz (129.5 kg)  12/24/18 279 lb (126.6 kg)  12/23/18 282 lb 9.6 oz (128.2 kg)   GEN: Well nourished, well developed in no acute distress HEENT: Normal, moist mucous membranes NECK: No JVD CARDIAC: regular rhythm, normal S1 and S2, no rubs, gallops. 1/6 SEM at LSB, increases slightly with valsalva VASCULAR: Radial and DP pulses 2+ bilaterally. No carotid bruits RESPIRATORY:  Clear to auscultation without rales, wheezing or rhonchi  ABDOMEN: Soft, non-tender, non-distended MUSCULOSKELETAL:  Ambulates independently SKIN: Warm and dry, no  edema NEUROLOGIC:  Alert and oriented x 3. No focal neuro deficits noted. PSYCHIATRIC:  Normal affect but endorses feelings of being down  ASSESSMENT:    1. Other chest pain   2. Hypertrophic cardiomyopathy ([place])   3. Current moderate episode of major depressive disorder without prior episode (HCC)   4. Class 3 severe obesity with body mass index (BMI) of 45.0 to 49.9 in adult, unspecified obesity type, unspecified whether serious comorbidity present ([place])   5. OSA on CPAP   6. Exercise counseling   7. Nutritional counseling   8. Essential hypertension   9. Chronic diastolic congestive heart failure (HCC)    PLAN:    Chest pain: unchanged.  -No suggestion of ischemia or obstruction on prior stress test. Low suspicion for cardiac etiology  Hypertrophic cardiomyopathy: stable -no red flags of  syncope, family history of SCD  -stress echo done after last visit -previously followed at [facility], notes in care everywhere -continue metoprolol succinate 75 mg daily  Depression: she describes to me what sounds like continued depression, worsened by social isolation from Covid. I recommended that she continue to work with her PCP on this. On amitryptiline for sleep issues, I will defer any medication changes or additions for depression  Morbid obesity: weight is up, which she attributes to pandemic/social isolation and depression.   Obstructive sleep apnea: working on keeping CPAP functional  Hypertension, chronic diastolic heart failure: appears euvolemic today, BP within range of goal -continue candesartan 2 mg, spironolactone 12.5 mg, and torsemide 10 mg daily  Nutrition and exercise counseling -recommend heart healthy/Mediterranean diet, with whole grains, fruits, vegetable, fish, lean meats, nuts, and olive oil. Limit salt. -recommend moderate walking, 3-5 times/week for 30-50 minutes each session. Aim for at least 150 minutes.week. Goal should be pace of 3 miles/hours, or walking 1.5 miles in 30 minutes  Not addressed in depth today: Pacemaker: followed by Dr. Sallyanne Kuster  Plan for follow up: 6 mos or sooner PRN  TIME SPENT WITH PATIENT: 25 minutes of direct patient care. More than 50% of that time was spent on coordination of care and counseling regarding mood, symptoms, management of her complex cardiac conditions.  Buford Dresser, MD, PhD [place]  [facility]   Medication Adjustments/Labs and Tests Ordered: Current medicines are reviewed at length with the patient today.  Concerns regarding medicines are outlined above.  No orders of the defined types were placed in this encounter.  No orders of the defined types were placed in this encounter.   Patient Instructions  Medication Instructions:  Your Physician recommend you continue on your current  medication as directed.    If you need a refill on your cardiac medications before your next appointment, please call your pharmacy.   Lab work: None  Testing/Procedures: None  Follow-Up: At [facility], you and your health needs are our priority.  As part of our continuing mission to provide you with exceptional heart care, we have created designated Provider Care Teams.  These Care Teams include your primary Cardiologist (physician) and Advanced Practice Providers (APPs -  Physician Assistants and Nurse Practitioners) who all work together to provide you with the care you need, when you need it. You will need a follow up appointment in 6 months.  Please call our office 2 months in advance to schedule this appointment.  You may see Buford Dresser, MD or one of the following Advanced Practice Providers on your designated Care Team:   Rosaria Ferries, PA-C . Curt Bears  Purcell Nails, DNP, ANP         Signed, Buford Dresser, MD PhD 06/21/2019   [place]

## 2019-06-21 NOTE — Patient Instructions (Addendum)

## 2019-06-28 ENCOUNTER — Encounter: Payer: Self-pay | Admitting: Cardiology

## 2019-06-28 DIAGNOSIS — R0789 Other chest pain: Secondary | ICD-10-CM | POA: Insufficient documentation

## 2019-06-28 DIAGNOSIS — I10 Essential (primary) hypertension: Secondary | ICD-10-CM | POA: Insufficient documentation

## 2019-07-14 ENCOUNTER — Other Ambulatory Visit: Payer: Self-pay | Admitting: Family Medicine

## 2019-07-14 ENCOUNTER — Other Ambulatory Visit: Payer: Self-pay

## 2019-07-14 ENCOUNTER — Ambulatory Visit (INDEPENDENT_AMBULATORY_CARE_PROVIDER_SITE_OTHER): Payer: Medicare Other | Admitting: Family Medicine

## 2019-07-14 ENCOUNTER — Encounter: Payer: Self-pay | Admitting: Family Medicine

## 2019-07-14 DIAGNOSIS — Z9989 Dependence on other enabling machines and devices: Secondary | ICD-10-CM | POA: Diagnosis not present

## 2019-07-14 DIAGNOSIS — G4733 Obstructive sleep apnea (adult) (pediatric): Secondary | ICD-10-CM

## 2019-07-14 DIAGNOSIS — R7303 Prediabetes: Secondary | ICD-10-CM | POA: Diagnosis not present

## 2019-07-14 DIAGNOSIS — Z6841 Body Mass Index (BMI) 40.0 and over, adult: Secondary | ICD-10-CM

## 2019-07-14 MED ORDER — TORSEMIDE 10 MG PO TABS: 10.0000 mg | ORAL_TABLET | Freq: Every day | ORAL | 3 refills | Status: DC

## 2019-07-14 NOTE — Progress Notes (Signed)
Telehealth Encounter I connected with Sophia Wilkinson (MRN [id number]) on 07/14/2019 by MyChart video-enabled, HIPAA-compliant telemedicine application, verified that I am speaking with the correct person using two identifiers, and that the patient was in a private environment conducive to confidentiality.  I discussed the limitations of evaluation and management by telemedicine. The patient expressed understanding and agreed to proceed.  Provider was Darrelyn Hillock, DO and Kennith Center, PhD, RD, LDN, CEDRD Provider(s) located at home/FMC during this telehealth encounter; patient was at home  Appt start time: 1000 end time: 1030 (30 minutes)  Reason for telehealth visit: Medical Nutrition Therapy for obesity  Relevant history/background: Sophia Wilkinson is a 53 year old female with a history of diastolic heart failure, OSA on CPAP, obesity, and prediabetes with last A1c of 6.4 presenting for medical nutrition therapy.  Last charted weight 285 lbs on 8/18, up from 279 in 12/2018.   In terms of reaching her goals set during 8/13 visit, she has done well.  She got herself a 2 L water bottle for which she has been drinking all water at least 5-6 days a week.  Limited her sweet tea and sodas as well.  She continues to struggle with well-balanced meals and eating regularly throughout the day.  Her daughter has found a "healthy meal" recipe guideline, for which they can start making these meals next week to incorporate more vegetables.  She has not been writing down her goals and keeping track of them.  Additionally, notes her physical activity has decreased since the pandemic started.  She used to walk around [place] or [employer] almost every day a week, however has been avoiding this due to the pandemic.  Endorses the heat is too much for her to walk outside as well, despite trying earlier or later in the day. States she seems to do better on the days where she is not thinking about her son.  She still grieves the loss  of her son, feels this does hold her back from reaching her goals.  She would be interested in therapy, however feels like the last several times that she has tried establishing that they were not providing any additional recommendations beyond what she had already been doing.  Has strong family support.  Assessment:  Usual eating pattern: 1-2 meals and 1 snacks per day. Frequent foods and beverages: Water, sometimes soda. Avoided foods: Trying to limit beef and starchy foods.   Usual physical activity: Minimal. Sleep: Varies, 3-5 hours. 24-hr recall   (Up at  AM) 930-10am B ( [id number]: 3 slices of [place] bacon, hamburger bun  Snk ( AM)-none  L ( PM)-none  Snk ( PM)- 4 pm: [place]vita biscuits, 8 oz cup of milk  D ( PM)- 7pm: Shrimp, rice, black beans, and corn pre-made frozen oven meal   Snk ( PM)- none Typical day? Yes.     On average, states she will usually get about 2 servings of vegetables.  Around 4-5 days a week she has been trying to add a good sized salad with lunch.  Intervention:  Overall, she appears to be motivated towards losing weight, however continues to struggle with the process of doing so.  Melancholic demeanor throughout encounter, continues to grieve the loss of her son for which she attributes her struggles to not being able to commit to healthier lifestyle changes on a routine basis.  Discussed starting therapy and meeting with her primary care provider, she is interested in reaching out to counseling once again.  Reassured that  she has a strong family support.  Congratulated her on increasing water intake and starting the process of increasing vegetables within her diet.  Will work towards establishing goals for increasing activity, vegetables, and consistent food intake.  For recommendations and goals, see Patient Instructions.    Goal:  1.  Eat at least 3 servings of vegetables, 5 days/week.  2.  Walk at least 30 minutes, 4 days a week.  Discussed this could be  inside or outside. 3.  Work towards eating 3 meals daily, including 1 protein food, 1 carb food, and 1 veggie and/or fruit.  Additionally recommended that she write down all of these goals and keep track of them through goal sheet that will be emailed to her after this visit.  Stressed the importance of this.  Provided with a list of therapists within the area that she can contact.  Will route this note to PCP as well.   Follow-up: October 29th  Sophia Wilkinson

## 2019-07-14 NOTE — Patient Instructions (Addendum)
Goals:  1.  At least 3 servings of vegetables 5 days a week. 2. Walk atleast 30 minutes, 4 days a week. Whether this be inside at [facility] or outside when it has cooled down some.  3. Work towards eating 3 meals daily, including one protein food, one carb food, and one veggie and/or fruit.   Recommend you write down all of these goals and keep track on them through this goals sheet, on your kitchen calendar, or within a journal. This helps keep you accountable. See you back on 10/29!     Therapy and Counseling Resources Most providers on this list will take Medicaid. Patients with commercial insurance or Medicare should contact their insurance company to get a list of in network providers.  [facility] [address], [place], [place] [address]       [phone]     [facility] [address], [place], [place]    Jinny Blossom [facility] [address], [place], [place]  [facility] Solutions:  [place]. [place] [facility]  [facility]:  [address] Loni Muse, [place]  [facility] (under & uninsured) [address] [place] [phone]    [email or web address]    [facility] [place]     Phone:  [phone]     Carney Pomeroy  [place] [address]      [place], [place] [address] [place]: [place], [place], [place]   [place] ([place] therapist) [address]   [place] [address]    [phone]    [facility]   [place],  [place], [place]   [place]  [place]  [facility]  [address] [place], [place]         [phone]  Open Access/Walk In Clinic under & uninsured [place],  [address] [place] [phone]):  Mon - Fri from 8 AM - 3 PM  [facility] Service of the [place],  ([place])   [place], [place]: [phone]) 8:30 - 12; 1 - 2:30  Family Service of the [place],  [place], [place]    ([phone]):8:30 - 12; 2 - 3PM  [facility],  [address],  [place]; [phone]):   Mon - Fri 8 AM - 5 PM  [facility] [place]  MWF 12:30 to 3:00 or call to schedule an appointment  [phone]  Specific Provider options Psychology Today  [email or web address] 1. click on find a therapist  2. enter your zip code 3. left side and select or tailor a therapist for your specific need.   [facility] Provider Directory [email or web address]  (Medicaid)   Follow all drop down to find a provider  [place] or [email or web address] [address], [place], [place] Recovery support and educational   In home counseling [place] Telephone: [phone]  office in [place] [email or web address]   Does not take reg. Medicaid or Medicare private insurance BCCS, [place] health Choice, [place], [place], [place], [place], [place]  Availability:  . [place] or [phone]  . Family Service of the [employer] [phone]  [facility]  [phone]   . [place]  [phone] (after hours)  . Therapeutic Alternative/Mobile Crisis   [phone]  . [place] National Suicide Hotline  (586)571-2552 ([place])  . Call 911 or go to emergency room  . [employer]  [phone]);  [place] and [employer]   . [facility] ACCESS  [phone]); [place], [place], [place],  [place], [place], [place], [place]

## 2019-08-17 ENCOUNTER — Ambulatory Visit (INDEPENDENT_AMBULATORY_CARE_PROVIDER_SITE_OTHER): Payer: Medicare Other | Admitting: *Deleted

## 2019-08-17 DIAGNOSIS — I5032 Chronic diastolic (congestive) heart failure: Secondary | ICD-10-CM

## 2019-08-17 DIAGNOSIS — I422 Other hypertrophic cardiomyopathy: Secondary | ICD-10-CM | POA: Diagnosis not present

## 2019-08-21 LAB — CUP PACEART REMOTE DEVICE CHECK
Battery Remaining Longevity: 145 mo
Battery Remaining Percentage: 95.5 %
Battery Voltage: 2.99 V
Brady Statistic AP VP Percent: 1 %
Brady Statistic AP VS Percent: 3.6 %
Brady Statistic AS VP Percent: 1 %
Brady Statistic AS VS Percent: 95 %
Brady Statistic RA Percent Paced: 3.1 %
Brady Statistic RV Percent Paced: 1.2 %
Date Time Interrogation Session: 20201009111120
Implantable Lead Implant Date: 20160913
Implantable Lead Implant Date: 20160913
Implantable Lead Location: 753859
Implantable Lead Location: 753860
Implantable Lead Model: 1948
Implantable Pulse Generator Implant Date: 20160913
Lead Channel Impedance Value: 430 Ohm
Lead Channel Impedance Value: 610 Ohm
Lead Channel Pacing Threshold Amplitude: 0.625 V
Lead Channel Pacing Threshold Amplitude: 0.625 V
Lead Channel Pacing Threshold Pulse Width: 0.4 ms
Lead Channel Pacing Threshold Pulse Width: 0.4 ms
Lead Channel Sensing Intrinsic Amplitude: 3.4 mV
Lead Channel Sensing Intrinsic Amplitude: 5.4 mV
Lead Channel Setting Pacing Amplitude: 0.875
Lead Channel Setting Pacing Amplitude: 1.625
Lead Channel Setting Pacing Pulse Width: 0.4 ms
Lead Channel Setting Sensing Sensitivity: 0.7 mV
Pulse Gen Model: 2240
Pulse Gen Serial Number: 7676319

## 2019-08-29 NOTE — Progress Notes (Signed)
Remote pacemaker transmission.   

## 2019-09-01 ENCOUNTER — Telehealth (INDEPENDENT_AMBULATORY_CARE_PROVIDER_SITE_OTHER): Payer: Medicare Other | Admitting: Student in an Organized Health Care Education/Training Program

## 2019-09-01 DIAGNOSIS — Z6841 Body Mass Index (BMI) 40.0 and over, adult: Secondary | ICD-10-CM | POA: Diagnosis not present

## 2019-09-01 NOTE — Patient Instructions (Addendum)
It was a pleasure to see you today!  To summarize our discussion for this visit:  I'm happy to hear that you are getting started with counseling. Please follow up about the missing appointment for today.  For health/nutrition goals, I think we will keep up with the goals sheet and meeting our goals from last visit.  Walking at least 59min/day 4days/week  Trying to get 3 meals per day (even if they are small meals to help spread out your calorie intake throughout the day). Eating with your grandkids to set a good example and planning meals ahead of time might be good ideas to encourage this goal.  Increase the amount of vegetables you are getting per week    Thank you for allowing me to take part in your care,  Dr. Doristine Mango

## 2019-09-01 NOTE — Progress Notes (Signed)
Telehealth Encounter I connected with Sophia Wilkinson (MRN [id number] 10/29/2020by MyChart video-enabled, HIPAA-compliant telemedicine application. I discussed the limitations of evaluation and management by telemedicine. The patient expressed understanding and agreed to proceed.  Provider was Doristine Mango, DO and Kennith Center, PhD, RD, LDN, CEDRD Provider(s) located at home/[facility] during this telehealth encounter; patient was at home  Appt start time: 1000 end time: 1030 (30 minutes)  Reason for telehealth visit: Medical Nutrition Therapy for obesity  Relevant history/background: [name] is a 53 year old female with a history of diastolic heart failure, OSA on CPAP, obesity, and prediabetes with last A1c of 6.4 presenting for medical nutrition therapy.  Last charted weight 285 lbs on 8/18, up from 279 in 12/2018.   Patient feels that she has been doing pretty good with her goals except for trying to get 3 meals per day. she states she just has no appetite and cannot make herself eat during the day.  She has been averaging about 1 meal per day without snacking and feels no hunger throughout the day.  She is trying to increase her vegetable intake which occurs mainly at dinnertime.  She had started eating carrots and celery with ranch dip for snack in the mornings but got bored of this. She endorses meeting her walking goal of 30 minutes at least 4 times per week. Patient has moderate depressive symptoms and is seeking counseling at this time.  She had an initial meeting on Sunday with a new counselor and was supposed to have a follow-up meeting this morning however, she was not able to connect with the counselor for some reason. [name] assists her grandchildren with schoolwork throughout the day and is present with them when they are eating lunch but does not eat herself. [name] has been using her goal sheet and feeling out throughout the week which she thinks is helping her reach her  goals.  Assessment:  Usual eating pattern: 1-2 meals and 1 snacks per day. Frequent foods and beverages: Water, sometimes soda. Avoided foods: Trying to limit beef and starchy foods.   Usual physical activity:  Up to 30 minutes of walking approximately 4 days/week Sleep: Varies, 3-5 hours. 24-hr recall   (Up at 1030 AM)  B ( AM)- none Snk ( AM)-none            L ( PM)-none    Snk ( PM)- none D ( 530PM)-   daughter cooked 4 fried chicken wings, 2 Tbsp rice and 1.5c creamed spinach (spinach with cream cheese). Mini coke and a water.  Snk ( PM)- none Typical day? Yes.      For recommendations and goals, see Patient Instructions.    Goal:  1.  Eat at least 3 servings of vegetables, 5 days/week.  2.  Walk at least 30 minutes, 4 days a week.   3.  Work towards eating 3 meals daily, including 1 protein food, 1 carb food, and 1 veggie and/or fruit.   Follow-up: November 19th 1030  Doristine Mango, DO Family medicine PGY 2

## 2019-09-14 ENCOUNTER — Other Ambulatory Visit: Payer: Self-pay

## 2019-09-14 NOTE — Patient Outreach (Signed)
[facility]) Care Management  09/14/2019  DEOLA WITTMAN 05-19-1966 [id number]   Medication Adherence call to Mrs. Helayne [place] [facility] Identifiers Verify spoke with patient she is past due on Rosuvastatin 5 mg,patient explain she never started taking this medication,doctor took her off,patient explain she does not need it. Mrs. Justice is showing past due under [place].    [facility] Direct Dial [phone]  Fax [phone] [email or web address]

## 2019-09-22 ENCOUNTER — Telehealth: Payer: Medicare Other | Admitting: Family Medicine

## 2019-10-06 ENCOUNTER — Other Ambulatory Visit: Payer: Self-pay

## 2019-10-06 ENCOUNTER — Encounter: Payer: Self-pay | Admitting: Family Medicine

## 2019-10-06 ENCOUNTER — Telehealth (INDEPENDENT_AMBULATORY_CARE_PROVIDER_SITE_OTHER): Payer: Medicare Other | Admitting: Family Medicine

## 2019-10-06 DIAGNOSIS — Z6841 Body Mass Index (BMI) 40.0 and over, adult: Secondary | ICD-10-CM | POA: Diagnosis not present

## 2019-10-06 NOTE — Progress Notes (Signed)
Telehealth Encounter I connected with [name] (MRN [id number]) on 10/06/2019 by video-enabled, telemedicine application, verified that I am speaking with the correct person using two identifiers, and that the patient was in a private environment conducive to confidentiality.  The patient agreed to proceed.  Provider was Rory Percy, DO and Kennith Center, PhD, RD, LDN, CEDRD Provider(s) located at home during this telehealth encounter; patient was at [facility]  Appt start time: 11:30am end time: 12:10pm (40 min)  Reason for telehealth visit: Medical Nutrition Therapy for obesity  Relevant history/background: [name] is a 53 year old female with a history of diastolic heart failure, OSA on CPAP, obesity, and prediabetes with last A1c of 6.4 presenting for medical nutrition therapy. Last charted weight 285 lbson 8/18,up from 279 in 12/2018.  Reports she was doing well with her goals after previous appointment, but as November progressed and approached the holidays and her deceased son's birthday, she got overwhelmed and was not meeting her goals.   Reports she tends to do better with getting vegetables in during dinner. She is still eating 1-2 meals per day, reports she doesn't really have a taste for anything and sometimes isn't hungry. She is seeing a grief therapist twice per week and is going ok. She is taking elavil for sleep around 4:30-5pm about 6 days out of the week and is getting good sleep with this.  She is working towards meeting her exercise goal but not yet meeting her goal, is walking >30 minutes per day twice per week.   Assessment:  Usual eating pattern: 1-2 meals and 0-1 snacks per day.  Frequent foods and beverages: velveeta cookies for snacking if only eating 1 meal per day. Salad, chicken, fish, [place].      Usual physical activity: >30 minutes per day at least 2x per week. Sleep: sleeping with CPAP. Goes to bed around 10:30-11pm, falls asleep around 84m when  she is able to take her elavil. Wakes up around 8:30am, plays on her phone or reads her bible until she gets out of bed around 11-11:30am. Reports varied sleep quality. Wakes up a few times per night but is able to fall back asleep easily.  24-hr recall  (Wakes up at 8:30 AM, gets up around 10:30-11am) L ( 12:30-1 pM)- baked chicken sandwich, mayo, ketchup, pepper. Bag of [place] chips, nothing to drink.   D ( 7:30 PM)- chicken breast, string beans, baked mac and cheese, sweet potatoes. Peach tea (canned, unsure if sweetened).  Typical day? Yes.  but was an emotional day for her.  For recommendations and goals, see Patient Instructions.    Goals:  1 - Wake up at 8:30am and within 5-10 minutes of waking up, get out of bed.  2 - Work towards eating 3 meals daily, including 1 protein food, 1 carb food, and 1 veggie and/or fruit. 3 - Walk at least 30 minutes per day,3 days a week.  Follow-up: 12/11 at 10:10am with PCP for depression f/u, 2/4 at 10:30am for nutrition f/u.  ARory Percy

## 2019-10-06 NOTE — Patient Instructions (Signed)
It was great to see you!  Our plans for today:  - Continue with counseling. Follow up with me next week to talk more about your depression. We will likely adjust your medications at this visit.  - We discussed nutrition goals at today's visit:  1 - Wake up at 8:30am and within 5-10 minutes of waking up, get out of bed.  2 - Work towards eating 3 meals daily, including 1 protein food, 1 carb food, and 1 veggie and/or fruit. 3 - Walk at least 30 minutes per day,3 days a week.  Take care and seek immediate care sooner if you develop any concerns.   Dr. [facility]

## 2019-10-14 ENCOUNTER — Ambulatory Visit: Payer: Medicare Other | Admitting: Family Medicine

## 2019-10-14 ENCOUNTER — Ambulatory Visit (INDEPENDENT_AMBULATORY_CARE_PROVIDER_SITE_OTHER): Payer: Medicare Other | Admitting: Family Medicine

## 2019-10-14 ENCOUNTER — Encounter: Payer: Self-pay | Admitting: Family Medicine

## 2019-10-14 ENCOUNTER — Other Ambulatory Visit: Payer: Self-pay

## 2019-10-14 VITALS — BP 124/60 | HR 60 | Ht 67.0 in | Wt 277.0 lb

## 2019-10-14 DIAGNOSIS — R200 Anesthesia of skin: Secondary | ICD-10-CM

## 2019-10-14 DIAGNOSIS — R202 Paresthesia of skin: Secondary | ICD-10-CM

## 2019-10-14 DIAGNOSIS — Z6841 Body Mass Index (BMI) 40.0 and over, adult: Secondary | ICD-10-CM

## 2019-10-14 DIAGNOSIS — F321 Major depressive disorder, single episode, moderate: Secondary | ICD-10-CM

## 2019-10-14 DIAGNOSIS — R7303 Prediabetes: Secondary | ICD-10-CM

## 2019-10-14 LAB — POCT GLYCOSYLATED HEMOGLOBIN (HGB A1C): HbA1c, POC (controlled diabetic range): 6.4 % (ref 0.0–7.0)

## 2019-10-14 NOTE — Patient Instructions (Addendum)
It was great to see you!  Our plans for today:  - Continue to take the amitriptyline consistently for better sleep. - Losing weight will be helpful to decrease the load off of your sides when you sleep. - Continue to work on your nutritional goals as discussed:  1 - Wake up at 8:30am and within 5-10 minutes of waking up, get out of bed.  2 - Work towards eating 3 meals daily, including 1 protein food, 1 carb food, and 1 veggie and/or fruit. 3 - Walk at least 30 minutes per day,3 days a week.  Take care and seek immediate care sooner if you develop any concerns.   Dr. Johnsie [facility]

## 2019-10-14 NOTE — Progress Notes (Signed)
Subjective:   Patient ID: Sophia Wilkinson    DOB: 1966/04/13, 53 y.o. female   MRN: [id number]  Sophia Wilkinson is a 53 y.o. female with a history of diastolic CHF, migraine, HTN, OSA on CPAP, morbid obesity, prediabetes, sick sinus syndrome s/p pacemaker, depression here for   Depression - Medications: Amitriptyline 25 mg - Taking: not every night - Current stressors: holidays, anniversary of son's birthday - sleep is about the same, has improved since obtaining new CPAP supplies. - Concern at nutrition appt last week that depression was playing a role in lack of appetite and poor eating habits given patient endorsing difficulties in mood with holidays and upcoming anniversary of deceased son's birthday. - reports she has always eaten 2 meals per day even when son was alive, does not feel her lack of appetite is related to her depression. - feels her mood is doing ok.   Numbness of side Notices numbness of dependent side when sleeping at night. Becomes uncomfortable and slightly painful. Switches to other side with relief of pain and numbness but will then have pain and numbness of other side. Not painful with not laying on it. No fevers. Taking pressure off helps relieve pain. Can't sleep on back. No trauma or injury.   Review of Systems:  Per HPI.  Medications and smoking status reviewed.  Objective:   BP 124/60   Pulse 60   Ht 5\' 7"  (1.702 m)   Wt 277 lb (125.6 kg)   SpO2 99%   BMI 43.38 kg/m  Vitals and nursing note reviewed.  General: morbidly obese, in no acute distress with non-toxic appearance CV: regular rate  Lungs:  normal work of breathing Skin: warm, dry, no rashes or lesions Extremities: warm and well perfused, normal tone Neuro: Alert and oriented, speech normal Psych: appropriately dressed and well groomed. No flight of ideas or tangential thought process.  Depression screen [facility] 2/9 10/14/2019 05/23/2019 05/23/2019  Decreased Interest 1 1 1   Down, Depressed,  Hopeless 1 1 1   PHQ - 2 Score 2 2 2   Altered sleeping 1 1 1   Tired, decreased energy 2 1 1   Change in appetite 3 1 1   Feeling bad or failure about yourself  1 0 0  Trouble concentrating 0 0 0  Moving slowly or fidgety/restless 0 0 0  Suicidal thoughts 0 0 0  PHQ-9 Score 9 5 5   Difficult doing work/chores - Somewhat difficult Somewhat difficult     Assessment & Plan:   Depression PHQ 9 today, increased from previous but patient feels she is doing ok considering triggers of holidays and upcoming anniversary of son's birthday. Patient hesitant to switch TCA to SSRI as this helps with her sleep. Wants to try taking elavil more consistently, believe this is reasonable.  Numbness and tingling of upper and lower extremities of both sides Noted only with dependence while sleeping, 2/2 morbid obesity. No abnormalities or red flags on history or exam. Counseled on weight loss through diet and exercise as previously discussed in nutrition clinic last week.   Prediabetes A1c 6.4 today. Not making much progress on goals established at nutrition clinic last week. Again discussed weight loss through diet and exercise.   Class 3 severe obesity with body mass index (BMI) of 45.0 to 49.9 in adult [facility]) Not making much progress on goals established at nutrition clinic last week. Again discussed weight loss through diet and exercise.   Orders Placed This Encounter  Procedures  . HgB A1c  No orders of the defined types were placed in this encounter.   Rory Percy, DO PGY-3, [facility] 10/15/2019 12:20 PM

## 2019-10-15 DIAGNOSIS — R200 Anesthesia of skin: Secondary | ICD-10-CM | POA: Insufficient documentation

## 2019-10-15 DIAGNOSIS — R202 Paresthesia of skin: Secondary | ICD-10-CM | POA: Insufficient documentation

## 2019-10-15 NOTE — Assessment & Plan Note (Signed)
Not making much progress on goals established at nutrition clinic last week. Again discussed weight loss through diet and exercise.

## 2019-10-15 NOTE — Assessment & Plan Note (Signed)
A1c 6.4 today. Not making much progress on goals established at nutrition clinic last week. Again discussed weight loss through diet and exercise.

## 2019-10-15 NOTE — Assessment & Plan Note (Addendum)
Noted only with dependence while sleeping, 2/2 morbid obesity. No abnormalities or red flags on history or exam. Counseled on weight loss through diet and exercise as previously discussed in nutrition clinic last week.

## 2019-10-15 NOTE — Assessment & Plan Note (Signed)
PHQ 9 today, increased from previous but patient feels she is doing ok considering triggers of holidays and upcoming anniversary of son's birthday. Patient hesitant to switch TCA to SSRI as this helps with her sleep. Wants to try taking elavil more consistently, believe this is reasonable.

## 2019-11-09 ENCOUNTER — Ambulatory Visit (INDEPENDENT_AMBULATORY_CARE_PROVIDER_SITE_OTHER): Payer: Medicare Other | Admitting: *Deleted

## 2019-11-09 DIAGNOSIS — I422 Other hypertrophic cardiomyopathy: Secondary | ICD-10-CM | POA: Diagnosis not present

## 2019-11-10 ENCOUNTER — Other Ambulatory Visit: Payer: Self-pay | Admitting: Family Medicine

## 2019-11-16 ENCOUNTER — Encounter: Payer: Self-pay | Admitting: Family Medicine

## 2019-11-25 ENCOUNTER — Ambulatory Visit (INDEPENDENT_AMBULATORY_CARE_PROVIDER_SITE_OTHER): Payer: Medicare Other | Admitting: Cardiology

## 2019-11-25 ENCOUNTER — Other Ambulatory Visit: Payer: Self-pay

## 2019-11-25 ENCOUNTER — Encounter: Payer: Self-pay | Admitting: Cardiology

## 2019-11-25 VITALS — BP 112/73 | HR 71 | Temp 97.0°F | Ht 66.0 in | Wt 277.0 lb

## 2019-11-25 DIAGNOSIS — Z7182 Exercise counseling: Secondary | ICD-10-CM

## 2019-11-25 DIAGNOSIS — I5032 Chronic diastolic (congestive) heart failure: Secondary | ICD-10-CM | POA: Diagnosis not present

## 2019-11-25 DIAGNOSIS — I422 Other hypertrophic cardiomyopathy: Secondary | ICD-10-CM

## 2019-11-25 DIAGNOSIS — R0789 Other chest pain: Secondary | ICD-10-CM | POA: Diagnosis not present

## 2019-11-25 DIAGNOSIS — Z7189 Other specified counseling: Secondary | ICD-10-CM

## 2019-11-25 DIAGNOSIS — Z713 Dietary counseling and surveillance: Secondary | ICD-10-CM | POA: Diagnosis not present

## 2019-11-25 NOTE — Patient Instructions (Signed)

## 2019-11-25 NOTE — Progress Notes (Signed)
Cardiology Office Note:    Date:  11/25/2019   ID:  Sophia Wilkinson, DOB 1966/03/03, MRN [id number]  PCP:  Rory Percy, DO  Cardiologist:  Buford Dresser, MD PhD Pacemaker: Dr. Sallyanne Kuster  Referring MD: Rory Percy, DO   CC: follow up  History of Present Illness:    Sophia Wilkinson is a 54 y.o. female with a hx of hypertrophic cardiomyopathy who is seen in followup today. She was initially seen as a new consult at the request of Dr. Ky Barban for evaluation and management of heart failure on 06/18/18.   Cardiac history: She has a past medical history of hypertrophic cardiomyopathy without significant obstruction, chronic diastolic heart failure, morbid obesity, obstructive sleep apnea on CPAP, and bradycardia s/p PPM. She had been followed by [facility] until moving to [facility] 11/2017. She denies family history of sudden cardiac death. No syncope. No known history of NSVT/VT. Last [facility] note summarized in initial consult note.  Today: Feels about the same overall. Continues with rare chest pain as before. We reviewed her prior workup. She admits that sometimes she wonders if it is anxiety related to her poorly functioning pacemaker lead. Discussed this with her. Symptoms are mild and rare, discussed what to watch for/when to call.  Her breathing is the same. No issues with swelling. Not very active, staying safe at home, rarely goes out. She is frustrated that she hasn't been able to lose weight--weights have stayed withint same 10 lb range. Reviewed reasonable expectations for weight loss.   She has questions re: the Covid vaccine, does not think she wants to receive this. We discussed at length, below.  No PND or orthopnea, stable LE edema, no syncope or palpitations.  Past Medical History:  Diagnosis Date  . Allergy   . Anemia   . Blood transfusion without reported diagnosis   . Bradycardia   . Cancer ([place])   . Cardiomyopathy ([place])   . CHF (congestive heart  failure) ([place])   . Chronic pain   . Diabetes mellitus without complication ([place])   . GERD (gastroesophageal reflux disease)   . Heart murmur   . Migraines   . Sleep apnea     Past Surgical History:  Procedure Laterality Date  . ABDOMINAL HYSTERECTOMY  2016  . HERNIA REPAIR    . LAPAROSCOPY ABDOMEN DIAGNOSTIC    . LEEP    . PACEMAKER IMPLANT  2016  . TUBAL LIGATION  1999    Current Medications: Current Outpatient Medications on File Prior to Visit  Medication Sig  . acetaminophen (TYLENOL) 500 MG tablet Take by mouth.  Marland Kitchen amitriptyline (ELAVIL) 25 MG tablet Take 1 tablet (25 mg total) by mouth at bedtime.  . B Complex-C-E-Zn (ZINC-VITES PO) Take 3 each by mouth daily.  . candesartan (ATACAND) 4 MG tablet Take 0.5 tablets (2 mg total) by mouth daily.  Marland Kitchen ELDERBERRY PO Take 2 each by mouth daily.  Marland Kitchen loratadine (CLARITIN) 10 MG tablet Take 1 tablet (10 mg total) by mouth daily.  . metoprolol succinate (TOPROL-XL) 25 MG 24 hr tablet Take 3 tablets (75 mg total) by mouth daily.  . Multiple Vitamin (MULTIVITAMIN WITH MINERALS) TABS tablet Take 1 tablet by mouth daily.  . potassium chloride (KLOR-CON) 10 MEQ tablet TAKE 1 TO 2 TABLETS BY MOUTH ONCE DAILY AS DIRECTED  . Probiotic Product (PROBIOTIC-10 ULTIMATE PO) Take 1 each by mouth daily.  Marland Kitchen spironolactone (ALDACTONE) 25 MG tablet Take 0.5 tablets (12.5 mg total) by mouth daily.  Marland Kitchen  torsemide (DEMADEX) 10 MG tablet Take 1-2 tablets (10-20 mg total) by mouth daily.   No current facility-administered medications on file prior to visit.     Allergies:   Patient has no known allergies.   Social History   Socioeconomic History  . Marital status: Single    Spouse name: Not on file  . Number of children: 5  . Years of education: 4 year college  . Highest education level: Bachelor's degree (e.g., BA, AB, BS)  Occupational History  . Not on file  Tobacco Use  . Smoking status: Never Smoker  . Smokeless tobacco: Never Used    Substance and Sexual Activity  . Alcohol use: Never  . Drug use: Never  . Sexual activity: Not Currently    Birth control/protection: Surgical  Other Topics Concern  . Not on file  Social History Narrative   Lives with daughter and two grandkids in a manufactured home. Has steps into home (3). No pets. Likes to spend time with grandkids, reading, computer. Has smoke alarms, no throw rugs, no cords across floors. No grab bars.   Social Determinants of Health   Financial Resource Strain:   . Difficulty of Paying Living Expenses: Not on file  Food Insecurity:   . Worried About Charity fundraiser in the Last Year: Not on file  . Ran Out of Food in the Last Year: Not on file  Transportation Needs:   . Lack of Transportation (Medical): Not on file  . Lack of Transportation (Non-Medical): Not on file  Physical Activity:   . Days of Exercise per Week: Not on file  . Minutes of Exercise per Session: Not on file  Stress:   . Feeling of Stress : Not on file  Social Connections:   . Frequency of Communication with Friends and Family: Not on file  . Frequency of Social Gatherings with Friends and Family: Not on file  . Attends Religious Services: Not on file  . Active Member of Clubs or Organizations: Not on file  . Attends Archivist Meetings: Not on file  . Marital Status: Not on file     Family History: The patient's family history includes Alcohol abuse in her father, mother, and sister; Diabetes in her daughter, maternal grandmother, and sister; Drug abuse in her father; Heart disease in her maternal grandmother and paternal grandmother; Hyperlipidemia in her maternal grandmother; Hypertension in her daughter; Liver disease in her mother. There is no history of Colon cancer, Esophageal cancer, Rectal cancer, or Stomach cancer.  Mother passed away age 87 from liver disease. Father passed away at age 18 from overdose. Grandparents passed in their 31s from cancer, MS, and heart  disease. Brother living at age 53.   ROS:   Please see the history of present illness.  ROS otherwise negative  EKGs/Labs/Other Studies Reviewed:    The following studies were reviewed today: Notes through [facility] in initial consult note.   07/07/18 Study Conclusions - Stress ECG conclusions: The stress ECG was normal. This score   predicts a low risk of cardiac events. - Impressions: Normal stress echo  Impressions: - Normal study after maximal exercise. Normal stress echo  HCM treadmill stress echo 01/09/2017: CONCLUSIONS  Non-obstructive HCM.   Reduced exercise capacity for age (7.2 METs, 90% of age predicted).   Appropriate blood pressure response to exertion.   No evidence of exercise induced ischemia by echocardiography at a sub- adequate heart rate. Please note that the HCM echocardiogram is  focusedon the assessment of hemodynamics (peak outflow tract gradient) at peakstress rather than wall motion analysis. Wall motion images are collectedlater after termination of stress and thus it is likely that ischemic findings will be missed at lower heart rates. Inducible ischemia at higher workloadscannot be ruled out.While no wall motion abnormalities were noted onthis study, this study was performed to evaluate LV outflow obstructionwith exercise rather than being optimized to evaluate for stress inducedwall motion.  Calculated gradients assume an LA pressure of 10 mmHg. Gradients maybe underestimated if the true peak MR velocity is not captured. Directgradients may be overestimated if the Doppler signal is contaminated.  Treadmill Results  Protocol: Bruce Duration (min:sec):06:11 METS:  Stage:Amyl NitrateBP: (mmHg) [id number] HR: 131% MPHR 77  (mL inhaled)Sys Diast(bpm)    Baseline111 52 65    151 77 75  2 168 74 131  Peak 131  1 min recovery135 66 100  Recovery117 46 77  Recovery110 50 72   Reason for Termination:Dyspnea  Symptoms:Complained of shortness of breath and appeared dyspneic throughout exercise.  Recovery Time (min:sec):05:33   FINDINGS   EKG FINDINGS  Baseline:  Normal sinus rhythm, nonspecific T-wave changes.   Peak:  Sinus tachycardia with occasional PVCs, no ischemic EKG changes. METS:7.20  Hyperdynamic LV systolic function, LVEF estimated at 75-80%. All LVwalls augment appropriately.Near LV cavity obliteration. Peak LVOTgradient with exercise of 14 mmHg.Chordal SAM.  Recovery:  Normal sinus rhythm with frequent PVCs and ventricular couplet in earlyrecovery, no ischemic EKG changes.  Hyperdynamic LV systolic function, LVEF estimated at 70%.No regionalwall motion abnormalities. Chordal SAM.   Echo 01/09/2017 CONCLUSIONS   Normal left ventricular cavity size. Severe asymetric left ventricular hypertropy (septum 2.4 cm, posterior wall 1.1 cm).Increased (hyperdynamic) left ventricular ejection fraction. LVEF estimated at 70-75%.No regional wall motion abnormalities.Normal diastolic filling pattern for age.   Normal right ventricular size. Normal right ventricular global  systolic function.    Systolic anterior motion of the mitral valve chordae. No significant LVOT gradient at rest or with Valsalva (< 10 mmHg); post-PVC gradient 14 mmHg.   No significant valve dysfunction.   EKG:  EKG is personally reviewed today.  The ekg ordered today demonstrates normal sinus rhythm.  Recent Labs: 05/17/2019: BUN 8; Creatinine, Ser 0.80; Hemoglobin 13.1; Platelets 329; Potassium 4.4; Sodium  141 05/23/2019: TSH 1.390  Recent Lipid Panel    Component Value Date/Time   CHOL 213 (H) 05/23/2019 1547   TRIG 152 (H) 05/23/2019 1547   HDL 44 05/23/2019 1547   CHOLHDL 4.8 (H) 05/23/2019 1547   LDLCALC 139 (H) 05/23/2019 1547    Physical Exam:    VS:  BP 112/73   Pulse 71   Temp (!) 97 F (36.1 C) (Temporal)   Ht 5\' 6"  (1.676 m)   Wt 277 lb (125.6 kg)   SpO2 96%   BMI 44.71 kg/m     Wt Readings from Last 3 Encounters:  11/25/19 277 lb (125.6 kg)  10/14/19 277 lb (125.6 kg)  06/21/19 285 lb 9.6 oz (129.5 kg)   GEN: Well nourished, well developed in no acute distress HEENT: Normal, moist mucous membranes NECK: No JVD CARDIAC: regular rhythm, normal S1 and S2, no rubs or gallops. 1/6 SE murmur, no change with valsalva appreciated today. VASCULAR: Radial and DP pulses 2+ bilaterally. No carotid bruits RESPIRATORY:  Clear to auscultation without rales, wheezing or rhonchi  ABDOMEN: Soft, non-tender, non-distended MUSCULOSKELETAL:  Ambulates independently SKIN: Warm and dry, no edema NEUROLOGIC:  Alert and oriented x 3.  No focal neuro deficits noted. PSYCHIATRIC:  Normal affect   ASSESSMENT:    1. Hypertrophic cardiomyopathy ([place])   2. Other chest pain   3. Chronic diastolic congestive heart failure ([place])   4. Exercise counseling   5. Nutritional counseling   6. Educated about COVID-19 virus infection   7. Morbid obesity ([facility])    PLAN:    Chest pain: unchanged.  -No suggestion of ischemia or obstruction on prior stress test. Low suspicion for cardiac etiology -counseled on red flag warning signs that need immediate medical attention -she will call if it becomes stronger, more frequent, or more bothersome without being severe  Hypertrophic cardiomyopathy: stable -no red flags of syncope, family history of SCD  -stress echo done as above -previously followed at [facility], notes in care everywhere -continue metoprolol succinate 75 mg daily  Morbid  obesity: discussed managing/reasonable expectations for weight loss today  Hypertension, chronic diastolic heart failure: appears euvolemic today, BP at goal -continue candesartan 2 mg, spironolactone 12.5 mg, and torsemide 10 mg daily -balance with avoiding dehydration given HOCM.  Nutrition and exercise counseling -recommend heart healthy/Mediterranean diet, with whole grains, fruits, vegetable, fish, lean meats, nuts, and olive oil. Limit salt. -recommend moderate walking, 3-5 times/week for 30-50 minutes each session. Aim for at least 150 minutes.week. Goal should be pace of 3 miles/hours, or walking 1.5 miles in 30 minutes  Covid vaccine education: From my perspective, I am in favor of the Covid vaccine overall. We discussed that both the Dix vaccines that are currently available are mRNA vaccines. The three main ingredients are mRNA, normal saline, and a lipid droplet. I reviewed that mRNA triggers the protein machinery in the cell to make the spike protein. This is then presented to the immune system, which makes many antibodies to different portions of the protein. There is no way the mRNA can get into the nucleus to affect DNA. There is no material from the virus that can cause an actual infection with Covid. The booster (second) shot amplifies that immune response, and after this dose there is 95% efficacy in preventing symptomatic infection with Covid. The multiple antibodies produced means thus far, this method is also protective for the current Covid mutations that have been seen across the world. The risk of allergic reaction is largely related to the lipid droplet. People without a history of allergic reactions are monitored for 15 minutes, and those with a history of allergic reactions are monitored for 30 minutes. In general, we do not recommend pretreating with NSAIDs, benadryl, etc so as not to affect either the immune response to the vaccine or to mask/delay an allergic  reaction. There are certain specific circumstances when this could be recommended, so I always encourage discussion between the patient and their providers to discuss further. For people with weakened immune systems, on immunosuppression, or with a history of life threatening reactions, I recommend they discuss in more detail with their treatment team. I acknowledged that this is a decision up to the patient, but I offered my availability for any questions or concerns. I expressed that I am not an immune expert but am familiar with the data regarding the vaccines to date.   Not addressed in depth today: Pacemaker: followed by Dr. Sallyanne Kuster  Plan for follow up: 6 mos or sooner PRN  Total time of encounter: 50 minutes total time of encounter, including 40 minutes spent in face-to-face patient care. This time includes coordination of care and counseling regarding  symptoms, management, and extensive discussion/questions re: covid vaccein. Remainder of non-face-to-face time involved reviewing chart documents/testing relevant to the patient encounter and documentation in the medical record.  Buford Dresser, MD, PhD [place]  [facility]   Medication Adjustments/Labs and Tests Ordered: Current medicines are reviewed at length with the patient today.  Concerns regarding medicines are outlined above.  Orders Placed This Encounter  Procedures  . EKG 12-Lead   No orders of the defined types were placed in this encounter.   Patient Instructions  Medication Instructions:  Your Physician recommend you continue on your current medication as directed.    *If you need a refill on your cardiac medications before your next appointment, please call your pharmacy*  Lab Work: None  Testing/Procedures: None  Follow-Up: At [facility], you and your health needs are our priority.  As part of our continuing mission to provide you with exceptional heart care, we have created designated  Provider Care Teams.  These Care Teams include your primary Cardiologist (physician) and Advanced Practice Providers (APPs -  Physician Assistants and Nurse Practitioners) who all work together to provide you with the care you need, when you need it.  Your next appointment:   6 month(s)  The format for your next appointment:   In Person  Provider:   Buford Dresser, MD      Signed, Buford Dresser, MD PhD 11/25/2019   [place]

## 2019-11-28 LAB — CUP PACEART REMOTE DEVICE CHECK
Battery Remaining Longevity: 143 mo
Battery Remaining Percentage: 95.5 %
Battery Voltage: 2.99 V
Brady Statistic AP VP Percent: 1 %
Brady Statistic AP VS Percent: 3.7 %
Brady Statistic AS VP Percent: 1.1 %
Brady Statistic AS VS Percent: 95 %
Brady Statistic RA Percent Paced: 3.3 %
Brady Statistic RV Percent Paced: 1.3 %
Date Time Interrogation Session: 20210106020012
Implantable Lead Implant Date: 20160913
Implantable Lead Implant Date: 20160913
Implantable Lead Location: 753859
Implantable Lead Location: 753860
Implantable Lead Model: 1948
Implantable Pulse Generator Implant Date: 20160913
Lead Channel Impedance Value: 360 Ohm
Lead Channel Impedance Value: 630 Ohm
Lead Channel Pacing Threshold Amplitude: 0.625 V
Lead Channel Pacing Threshold Amplitude: 0.75 V
Lead Channel Pacing Threshold Pulse Width: 0.4 ms
Lead Channel Pacing Threshold Pulse Width: 0.4 ms
Lead Channel Sensing Intrinsic Amplitude: 2.8 mV
Lead Channel Sensing Intrinsic Amplitude: 5.2 mV
Lead Channel Setting Pacing Amplitude: 1 V
Lead Channel Setting Pacing Amplitude: 1.625
Lead Channel Setting Pacing Pulse Width: 0.4 ms
Lead Channel Setting Sensing Sensitivity: 0.7 mV
Pulse Gen Model: 2240
Pulse Gen Serial Number: 7676319

## 2019-12-03 ENCOUNTER — Other Ambulatory Visit: Payer: Self-pay | Admitting: Family Medicine

## 2019-12-08 ENCOUNTER — Telehealth (INDEPENDENT_AMBULATORY_CARE_PROVIDER_SITE_OTHER): Payer: Medicare Other | Admitting: Family Medicine

## 2019-12-08 ENCOUNTER — Other Ambulatory Visit: Payer: Self-pay

## 2019-12-08 DIAGNOSIS — Z713 Dietary counseling and surveillance: Secondary | ICD-10-CM | POA: Diagnosis not present

## 2019-12-08 NOTE — Progress Notes (Signed)
Telehealth Encounter I connected with Sophia Wilkinson (MRN [id number] 12/3/2020by video-enabled, telemedicine application, verified that I am speaking with the correct person using two identifiers, and that the patient was in a private environment conducive to confidentiality.  The patient agreed to proceed.  Provider was Marny Lowenstein, DO and Kennith Center, PhD, RD, LDN, CEDRD Provider(s) located at home during this telehealth encounter; patient was at [facility]/home  Appt start time: 10:30am end time: 11:10pm (40 min)  Reason for telehealth visit: Medical Nutrition Therapy for obesity  Relevant history/background: [name] is a 54 year old female with a history of diastolic heart failure, OSA on CPAP, obesity, and prediabetes. 279 > 277 lbs 12/2018. Patient struggles with "grieving" her son. Denies depression, seems to underestimate her affect on nutrition.Recommended patient follow up with PCP to discuss treatment. Patient is having difficulty meeting her exercise goals due to fear of catching coronavirus. She is contemplating getting a treadmill with her daughter. Spent a significant amount of time focusing on education of carbohydrate counting education and where in diet patient could reduce this including switching to a bran cereal for patient's first meal of the day or with dosing the amount of sweet tea she drinks to 8 ounces.  Assessment:  Usual eating pattern: 1-2 meals and 0-1 snacks per day.    Usual physical activity: 30 minutes per day at least 2x per week.  24-hr recall  (Up at  AM) L ( 12:30 PM)- 8.5 oz frosted flakes, 8 oz of 1% Milk, 1 banana  Snk ( AM)- None  D ( 6:30 PM)-  3 servings of Fried Catfish, 1/2 cup of french fries, 1 cup of broccoli, 1 cup of cole slaw, Raspberry Tea - 23 oz,   Snk ( 9 PM)- 1 piece of cake, H2O  Typical day? Yes.  But patient says she is considering doing more of a traditional lunch method.    For recommendations and goals, see Patient  Instructions.    Goals:   1 - Work towards eating 2 meals daily, including 1 protein food, 1 carb food, and 1 veggie and/or fruit. 2 - Limit starchy foods to TWO per meal and ONE per snack. ONE portion of a starchy  food is equal to the following:   - ONE slice of bread (or its equivalent, such as half of a hamburger bun).   - 1/2 cup of a "scoopable" starchy food such as potatoes or rice.   - 15 grams of Total Carbohydrate as shown on food label. 3 - Walk at least 30 minutes per day,3 days a week.  Follow-up:  1 month in nutrition clinic  Marny Lowenstein, MD, [place] - PGY3 12/08/2019 11:34 AM

## 2020-01-05 ENCOUNTER — Telehealth: Payer: Medicare Other | Admitting: Family Medicine

## 2020-01-11 ENCOUNTER — Other Ambulatory Visit: Payer: Self-pay

## 2020-01-11 ENCOUNTER — Ambulatory Visit (INDEPENDENT_AMBULATORY_CARE_PROVIDER_SITE_OTHER): Payer: Medicare Other | Admitting: Family Medicine

## 2020-01-11 ENCOUNTER — Encounter: Payer: Self-pay | Admitting: Family Medicine

## 2020-01-11 ENCOUNTER — Ambulatory Visit
Admission: RE | Admit: 2020-01-11 | Discharge: 2020-01-11 | Disposition: A | Discharge status: Home / Self Care | Payer: Medicare Other | Source: Ambulatory Visit | Attending: Family Medicine | Admitting: Family Medicine

## 2020-01-11 VITALS — BP 132/68 | HR 69 | Wt 280.0 lb

## 2020-01-11 DIAGNOSIS — G4733 Obstructive sleep apnea (adult) (pediatric): Secondary | ICD-10-CM | POA: Diagnosis not present

## 2020-01-11 DIAGNOSIS — R1032 Left lower quadrant pain: Secondary | ICD-10-CM

## 2020-01-11 DIAGNOSIS — R1031 Right lower quadrant pain: Secondary | ICD-10-CM

## 2020-01-11 DIAGNOSIS — M1712 Unilateral primary osteoarthritis, left knee: Secondary | ICD-10-CM | POA: Diagnosis not present

## 2020-01-11 DIAGNOSIS — M25511 Pain in right shoulder: Secondary | ICD-10-CM | POA: Diagnosis not present

## 2020-01-11 DIAGNOSIS — M25562 Pain in left knee: Secondary | ICD-10-CM

## 2020-01-11 DIAGNOSIS — G8929 Other chronic pain: Secondary | ICD-10-CM | POA: Diagnosis not present

## 2020-01-11 DIAGNOSIS — M25551 Pain in right hip: Secondary | ICD-10-CM | POA: Diagnosis not present

## 2020-01-11 DIAGNOSIS — M25552 Pain in left hip: Secondary | ICD-10-CM | POA: Diagnosis not present

## 2020-01-11 MED ORDER — METHYLPREDNISOLONE ACETATE 40 MG/ML IJ SUSP
40.0000 mg | Freq: Once | INTRAMUSCULAR | Status: AC
  Administered 2020-01-11: 12:00:00 40 mg via INTRAMUSCULAR

## 2020-01-11 NOTE — Progress Notes (Signed)
    SUBJECTIVE:   L knee pain - endorsing chronic L knee pain for years, worsening over past few months - received corticosone shot in L knee about 10 years ago, helped. - recent increasing pain with pulling senation behind knee periodicly.  - sitting, standing for longer period of time worse - no trauma or injury - sometimes hard to get going. - doesn't hurt more with walking. - takes tylenol, voltaren gel.   R Shoulder pain - woke up one day last week, was bothering. Pain progressive towards the end of day. Feels like something is drilling into her shoulder. - no trauma or injury. - has trouble lifting shoulder  PERTINENT  PMH / PSH: diastolic CHF, hypertrophic cardiomyopathy, HTN, OSA on CPAP, sciatica, thrombocytosis, severe obesity, prediabetes, depression, migraine, MVP  OBJECTIVE:   BP 132/68   Pulse 69   Wt 280 lb (127 kg)   SpO2 99%   BMI 45.19 kg/m   Gen: obese female, in NAD MSK:  L knee: nonTTP along joint lines. Negative anterior/posterior drawer, negative McMurrays. No effusion. Slight tenderness with patellar compression. +FADIR/FABER bilaterally.  R shoulder: painful arc with +empty can test and hawkins. Negative speeds, Yergason. nonTTP over [facility] joint  INJECTION: Patient was given informed consent, signed copy in the chart. Appropriate time out was taken. Area prepped and draped in usual sterile fashion. 1 cc of methylprednisolone 40 mg/ml plus  4 cc of 1% lidocaine without epinephrine was injected into the L knee using a(n) anterior medial approach. The patient tolerated the procedure well. There were no complications. Post procedure instructions were given.   ASSESSMENT/PLAN:   Right shoulder pain Most consistent with rotator cuff tendinopathy. Given age and lack of inciting trauma, likely degenerative. No findings consistent with infection, fracture, neural compromise.  Will provide rotator cuff strengthening exercises. Continue voltaren for pain  relief.  Left knee pain Likely 2/2 arthritis given history and exam. Given prior relief with steroid injection, offered and given today without immediate complications. Will obtain standing knee XR as well as bilateral hip XR given positive FADIR/FABER and occasional joint pain to assess for contribution of hip OA. Return precautions provided.    Sophia Wilkinson, [place]

## 2020-01-11 NOTE — Patient Instructions (Signed)
It was great to see you!  Our plans for today:  - You got a steroid shot in your knee. This should help with your pain. If you still have pain in a few weeks, come back to see [place]. - We are getting xrays of your hips and knees today, we will call you with these results. Get these done at [facility]. [address], [place], [place] [address] Phone: [phone]  Take care and seek immediate care sooner if you develop any concerns.   Dr. [facility]

## 2020-01-12 ENCOUNTER — Encounter: Payer: Self-pay | Admitting: Family Medicine

## 2020-01-13 DIAGNOSIS — M25562 Pain in left knee: Secondary | ICD-10-CM | POA: Insufficient documentation

## 2020-01-13 DIAGNOSIS — M25511 Pain in right shoulder: Secondary | ICD-10-CM | POA: Insufficient documentation

## 2020-01-13 NOTE — Assessment & Plan Note (Signed)
Most consistent with rotator cuff tendinopathy. Given age and lack of inciting trauma, likely degenerative. No findings consistent with infection, fracture, neural compromise.  Will provide rotator cuff strengthening exercises. Continue voltaren for pain relief.

## 2020-01-13 NOTE — Assessment & Plan Note (Signed)
Likely 2/2 arthritis given history and exam. Given prior relief with steroid injection, offered and given today without immediate complications. Will obtain standing knee XR as well as bilateral hip XR given positive FADIR/FABER and occasional joint pain to assess for contribution of hip OA. Return precautions provided.

## 2020-01-19 ENCOUNTER — Ambulatory Visit (INDEPENDENT_AMBULATORY_CARE_PROVIDER_SITE_OTHER): Payer: Medicare Other | Admitting: Family Medicine

## 2020-01-19 ENCOUNTER — Other Ambulatory Visit: Payer: Self-pay

## 2020-01-19 DIAGNOSIS — Z713 Dietary counseling and surveillance: Secondary | ICD-10-CM

## 2020-01-19 DIAGNOSIS — Z6841 Body Mass Index (BMI) 40.0 and over, adult: Secondary | ICD-10-CM

## 2020-01-19 DIAGNOSIS — R7303 Prediabetes: Secondary | ICD-10-CM

## 2020-01-19 NOTE — Progress Notes (Signed)
Telehealth Encounter I connected with Sophia Wilkinson (MRN [id number] 12/3/2020by video-enabled, telemedicine application, verified that I am speaking with the correct person using two identifiers, and that the patient was in a private environment conducive to confidentiality.  The patient agreed to proceed.  Provider was Guadalupe Dawn MD and Kennith Center, PhD, RD, LDN, CEDRD Provider(s) located at [facility] during this telehealth encounter; patient was at home  Appt start time: 10:00am end time: 1100pm  Reason for telehealth visit: Weight Management  Relevant history/background: Sophia Wilkinson is a 54 year old female with a history of diastolic heart failure, OSA on CPAP, obesity, and prediabetes. Most recent weight 280lbs on 01/11/2020 Patient struggles with grieving for a son that unfortunately passed away.  Assessment:  24-hr recall (Up at  AM) 8:15 AM, treadmill 30 minutes B ( AM)- 2 boiled eggs, sprinkled with sea salt, bottle of water,  Snk ( AM)- no snack L ( PM)-  none Snk ( PM)- none  D ( PM)- carrot nuggets 5-6 (nickle sized) , sauteed onion, squash, zuccini (air fryer), medium sized roasted potatoes (baked potato), baked Haddock (oven broiled, butter, no breading). Strawberry lemonade (regular, 16oz bottle, 64oz carbs)  Snk ( PM)- belvita blueberry snack cookies 4 pack (36 grams) Typical day? Yes.    Usually hitting goal per her report 6 out of 7 days per her report. Has switched from eating regular rice to cauliflower rice. Has only had brown rice two times over the last month. Bread once per week. Usually garlic toast or baked bread. No butter added. Once per week veggie lasagna.  Has strawberry lemonade drink with dinner 2-3 times per week, has belvita cookies for snack 2-3 times per week.  exercises once per day for 30 minutes with walking  Patient expresses frustration at her inability to lose weight. Based on her recall she goes very long stretches of the day without  any carbohydrates or substantial snack or meal. She then has a very large bolus of carbohydrates with dinner. Likely putting herself in starvation mode with the long period of fasting followed by heavy carb load leading to adipose tissue formation for storage. Then further reinforced importance of carb-portion size as she is going way above this with most meals.  Usual eating pattern: 2 meals, one snack   Usual physical activity: 30 minutes per day for 7 days a week recently  For recommendations and goals, see Patient Instructions.    Goals:   1 - Will attempt to eat 2 real meals per day. For breakfast, 1 carb and 1 protein For lunch 1 protein, 1 vegetables, 1 carb. 2. Will attempt to not go more than 5 hours during the day 3 - Limit starchy foods to TWO per meal and ONE per snack. ONE portion of a starchy  food is equal to the following:   - ONE slice of bread (or its equivalent, such as half of a hamburger bun).   - 1/2 cup of a "scoopable" starchy food such as potatoes or rice.   - 15 grams of Total Carbohydrate as shown on food label. 4 - Walk at least 30 minutes per day,2 times. 5 days per week.  Follow-up:  May 6th at 10:00  Guadalupe Dawn MD PGY-3 Family Medicine Resident 01/19/2020 9:40 AM

## 2020-01-19 NOTE — Patient Instructions (Addendum)
Goals for today 1 - Will attempt to eat 2 real meals per day. For breakfast, 1 carb and 1 protein For lunch 1 protein, 1 vegetables, 1 carb. 2. Will attempt to not go more than 5 hours during the day 3 - Limit starchy foods to TWO per meal and ONE per snack. ONE portion of a starchy  food is equal to the following:              - ONE slice of bread (or its equivalent, such as half of a hamburger bun).              - 1/2 cup of a "scoopable" starchy food such as potatoes or rice.              - 15 grams of Total Carbohydrate as shown on food label. 4 -Walk at least 30 minutesper day,2 times. 5 days per week.

## 2020-02-03 ENCOUNTER — Telehealth: Payer: Self-pay | Admitting: Cardiology

## 2020-02-03 NOTE — Telephone Encounter (Signed)
I spoke with the pt to let her know her monitor is up to date and her next transmission 02-27-2020.

## 2020-02-03 NOTE — Telephone Encounter (Signed)
Patient was calling in regards to the letter she received from the [facility] about a missed transmission. Please call the patient

## 2020-02-22 ENCOUNTER — Encounter: Payer: Self-pay | Admitting: Family Medicine

## 2020-02-28 ENCOUNTER — Telehealth: Payer: Self-pay

## 2020-02-28 NOTE — Telephone Encounter (Signed)
Spoke with patient to remind of missed remote transmission 

## 2020-02-29 ENCOUNTER — Telehealth: Payer: Self-pay

## 2020-02-29 NOTE — Telephone Encounter (Signed)
Frances Furbish called and scheduled the pt appointment for 5-11 around 4 pm with the device clinic.

## 2020-02-29 NOTE — Telephone Encounter (Signed)
Sophia Wilkinson has emailed me about getting patient to come in for RF telemetry with programmer, she says someone for her team needs to be present for this. If patient can be notified

## 2020-03-08 ENCOUNTER — Other Ambulatory Visit: Payer: Self-pay

## 2020-03-08 ENCOUNTER — Ambulatory Visit (INDEPENDENT_AMBULATORY_CARE_PROVIDER_SITE_OTHER): Payer: Medicare Other | Admitting: Family Medicine

## 2020-03-08 VITALS — Wt 267.0 lb

## 2020-03-08 DIAGNOSIS — Z6841 Body Mass Index (BMI) 40.0 and over, adult: Secondary | ICD-10-CM

## 2020-03-08 NOTE — Patient Instructions (Addendum)
Goals:  1 - Limit starchy foods to ONE per meal and ONE per snack. ONE portion of a starchy  food is equal to the following:   - ONE slice of bread (or its equivalent, such as half of a hamburger bun).   - 1/2 cup of a "scoopable" starchy food such as potatoes or rice.   - 15 grams of Total Carbohydrate as shown on food label. 2 - Walk at least 30 minutes per day. 5 days per week.  Follow-up: 04/19/2020 @ 10:00AM

## 2020-03-08 NOTE — Progress Notes (Signed)
Telehealth Encounter I connected with Sophia Wilkinson (MRN [id number] 05/[birth year]21by video-enabled, telemedicine application, verified that I am speaking with the correct person using two identifiers, and that the patient was in a private environment conducive to confidentiality.  The patient agreed to proceed.  Provider was [place] Lyndle Pang, DO and Kennith Center, PhD, RD, LDN, CEDRD Provider(s) located at [facility] during this telehealth encounter; patient was at home  Appt start time: 10:00am end time: 10:30pm  Reason for telehealth visit: Weight Management  Relevant history/background: Sophia Wilkinson is a 54 year old female with a history of HFpEF, OSA on CPAP, obesity, and prediabetes. Most recent weight 280lbs on 01/11/2020 Patient struggles with grieving for a son that unfortunately passed away.  Assessment:  Weight 267lb (self report) 24-hr recall suggests intake of 1200 kcal:  (Up at 8:30 AM) B ( 9 AM)- 1 slice toast, 45 cal with sugar free peach jelly 1 tsp, 1 boiled egg with some sea salt and pepper and 2 [place] sausage patties, water  L (12:30 PM)- chicken caesar salad with 0.25 cup dressing (300kcal), water   D (6 PM)- chicken and shrimp, 1 cup with broccoli, with onions and zucchini sauteed, 1 cup; sprite zero  Snk (8PM)- 100 cal chocolate chip cookies and an ice cream bar that has 60 kcal  Typical day? Yes. Sometimes has snack between lunch and dinner.   She has not been able to use the treadmill. Has been walking 2-3 week. She has been having an issue with her knee that causes pain. Her daughter started a weight loss program with [facility], and she has been working with her.   Usual eating pattern: 3 meals, one snack   Usual physical activity: 30 minutes per day for 7 days a week recently  For recommendations and goals, see Patient Instructions.   Goals:  1 - Limit starchy foods to ONE per meal and ONE per snack. ONE portion of a starchy  food is equal to  the following:   - ONE slice of bread (or its equivalent, such as half of a hamburger bun).   - 1/2 cup of a "scoopable" starchy food such as potatoes or rice.   - 15 grams of Total Carbohydrate as shown on food label. 2 - Walk at least 30 minutes per day. 5 days per week.   Follow-up: 04/19/2020 @ 10:00AM  [place] Emali Heyward, DO PGY-3, [place]

## 2020-03-10 ENCOUNTER — Other Ambulatory Visit: Payer: Self-pay | Admitting: Family Medicine

## 2020-03-13 ENCOUNTER — Ambulatory Visit (INDEPENDENT_AMBULATORY_CARE_PROVIDER_SITE_OTHER): Payer: Medicare Other | Admitting: Emergency Medicine

## 2020-03-13 ENCOUNTER — Other Ambulatory Visit: Payer: Self-pay

## 2020-03-13 DIAGNOSIS — I422 Other hypertrophic cardiomyopathy: Secondary | ICD-10-CM

## 2020-03-13 NOTE — Patient Instructions (Signed)
Move monitor closer to your side of the bed.

## 2020-03-20 LAB — CUP PACEART INCLINIC DEVICE CHECK
Battery Remaining Longevity: 148 mo
Battery Voltage: 2.99 V
Brady Statistic RA Percent Paced: 3 %
Brady Statistic RV Percent Paced: 1.2 %
Date Time Interrogation Session: 20210511162000
Implantable Lead Implant Date: 20160913
Implantable Lead Implant Date: 20160913
Implantable Lead Location: 753859
Implantable Lead Location: 753860
Implantable Lead Model: 1948
Implantable Pulse Generator Implant Date: 20160913
Lead Channel Impedance Value: 412.5 Ohm
Lead Channel Impedance Value: 587.5 Ohm
Lead Channel Pacing Threshold Amplitude: 0.75 V
Lead Channel Pacing Threshold Amplitude: 0.75 V
Lead Channel Pacing Threshold Amplitude: 0.75 V
Lead Channel Pacing Threshold Pulse Width: 0.4 ms
Lead Channel Pacing Threshold Pulse Width: 0.4 ms
Lead Channel Pacing Threshold Pulse Width: 0.4 ms
Lead Channel Sensing Intrinsic Amplitude: 2.2 mV
Lead Channel Sensing Intrinsic Amplitude: 4.2 mV
Lead Channel Setting Pacing Amplitude: 1 V
Lead Channel Setting Pacing Amplitude: 1.5 V
Lead Channel Setting Pacing Pulse Width: 0.4 ms
Lead Channel Setting Sensing Sensitivity: 0.7 mV
Pulse Gen Model: 2240
Pulse Gen Serial Number: 7676319

## 2020-03-20 NOTE — Progress Notes (Signed)
Device check in clinic by industry due to atrial noise and false AF episodes. See scanned in report.

## 2020-04-10 DIAGNOSIS — G4733 Obstructive sleep apnea (adult) (pediatric): Secondary | ICD-10-CM | POA: Diagnosis not present

## 2020-04-16 ENCOUNTER — Other Ambulatory Visit: Payer: Self-pay | Admitting: Family Medicine

## 2020-04-16 NOTE — Progress Notes (Signed)
I connected with  Sophia Wilkinson (MRN [id number]) on 04/16/2020 by video-enabled telemedicine application [email or web address], and verified that I am speaking with the correct person using two identifiers.   I discussed the limitations of evaluation and management by telemedicine. The patient expressed understanding and agreed to proceed.  Provider was Mina Marble, PGY2 and Kennith Center, PhD, RD, LDN, CEDRD Provider(s) located at [facility] during this phone encounter; patient was at home  Session Time:  10:00a - 1030 (30 minutes)  Reason for telephone visit: Follow-up of Medical Nutrition Therapy for Weight Management  Relevant history/background: Sophia Wilkinson is a 54 year old female with a history of HFpEF, OSA on CPAP, obesity, and prediabetes. Most recent weight was 267lbs on 03/08/20, down from 280lbs on 01/11/2020. Patient struggles with grieving for a son that unfortunately passed away.  Assessment:  Patient notes things are going fantastic. She has been doing a 1200 calorie program. She has been working with her daughter which has been very helpful. Her goal was to limit her starchy foods. She feels she has done better with this. She has occasional starch in her meal 1-2x/week. She will have a low cal desert every evening. She feels her exercise/walking tolerance has improved. She is not as short of breath. Overall, she feels she is doing well. She had a few days that she fell off the wagon, but she jumped right back on her routine.  Weight today: 259lbs (self reported, down 26lbs per patient)  24-hr recall suggests intake of 1200 kcal:  (Up at 7:30-8 AM) B (9:30 AM)-  Boiled egg + [place] sausage patty  Snk -     ------------------------ L (12 PM)-  2 tbs buffalo chicken dip + 4 water crackers   Snk (3:30 PM)-  10 Tortilla chips + 1 wedge of laughing cow cream cheese + 2tbs sausa  D (6:30 PM)-  1.5c zucchini/onion/broccolli + 1c chicken and 1c shrimp + sprite zero  Snk (7:45 PM)-  Pack of chocolate  chips cookie (100kcal pack) + Ice-cream orange cream sherbet (60 cal) ~64oz of water a day  Typical day? Yes.   She usually has a pre-made salads with low-sodium [place] breast as a lunch   Every other day her and her daughter go to the store and walk around the store, ~45-1 hour of walking every other day. Occasionally the treadmill.   Usual eating pattern: 3 meals, two snacks  Usual physical activity: 30-45 minutes per day for 4 days a week of mild walking in the store  Goals:  1. I will have at least one serving of vegetables with lunch and dinner  2. I will limit starchy foods to ONE per meal and ONE per snack. ONE portion of a starchy  food is equal to the following:              - ONE slice of bread (or its equivalent, such as half of a hamburger bun).              - 1/2 cup of a "scoopable" starchy food such as potatoes or rice.              - 15 grams of Total Carbohydrate as shown on food label. 3.  I will walk on the treadmill 4 days a week for 30 minutes for moderate intensity  - On each Sunday I will plan what days of the week I will walk on the treadmill  4. I will walk at least 30 minutesevery  other day when not on the treadmill  Document your number of minutes of walking whenever you walk. You can write this on a calendar, planner, or create a chart.  Follow-up: Patient opted for PRN follow up  Mina Marble, [place], PGY2 04/16/2020 10:57 PM

## 2020-04-19 ENCOUNTER — Telehealth (INDEPENDENT_AMBULATORY_CARE_PROVIDER_SITE_OTHER): Payer: Medicare Other | Admitting: Family Medicine

## 2020-04-19 ENCOUNTER — Other Ambulatory Visit: Payer: Self-pay

## 2020-04-19 VITALS — Ht 66.0 in | Wt 259.0 lb

## 2020-04-19 DIAGNOSIS — Z6841 Body Mass Index (BMI) 40.0 and over, adult: Secondary | ICD-10-CM | POA: Diagnosis not present

## 2020-04-19 DIAGNOSIS — R7303 Prediabetes: Secondary | ICD-10-CM

## 2020-04-19 NOTE — Patient Instructions (Addendum)
Thank you for coming to see me today. It was a pleasure to see you.   Goals:  1. I will have at least one serving of vegetables with lunch and dinner  2. I will limit starchy foods to ONE per meal and ONE per snack. ONE portion of a starchy  food is equal to the following:              - ONE slice of bread (or its equivalent, such as half of a hamburger bun).              - 1/2 cup of a "scoopable" starchy food such as potatoes or rice.              - 15 grams of Total Carbohydrate as shown on food label. 3.  I will walk on the treadmill 4 days a week for 30 minutes for moderate intensity  - On each Sunday I will plan what days of the week I will walk on the treadmill  4. I will walk at least 30 minutesevery other day when not on the treadmill  Document your number of minutes of walking whenever you walk. You can write this on a calendar, planner, or create a chart.  If you have any questions or concerns, please do not hesitate to call the office at [phone].  Take Care,  Dr. Mina Marble, DO Resident Physician [place] [phone]

## 2020-05-06 IMAGING — DX DG PELVIS 1-2V
1 series · 1 of 1 positions shown · non-contrast
Comparison: None.

CLINICAL DATA: Chronic bilateral hip pain, left greater than right.
No reported injury.

EXAM:
PELVIS - 1-2 VIEW

[dg pelvis 1-2 views]
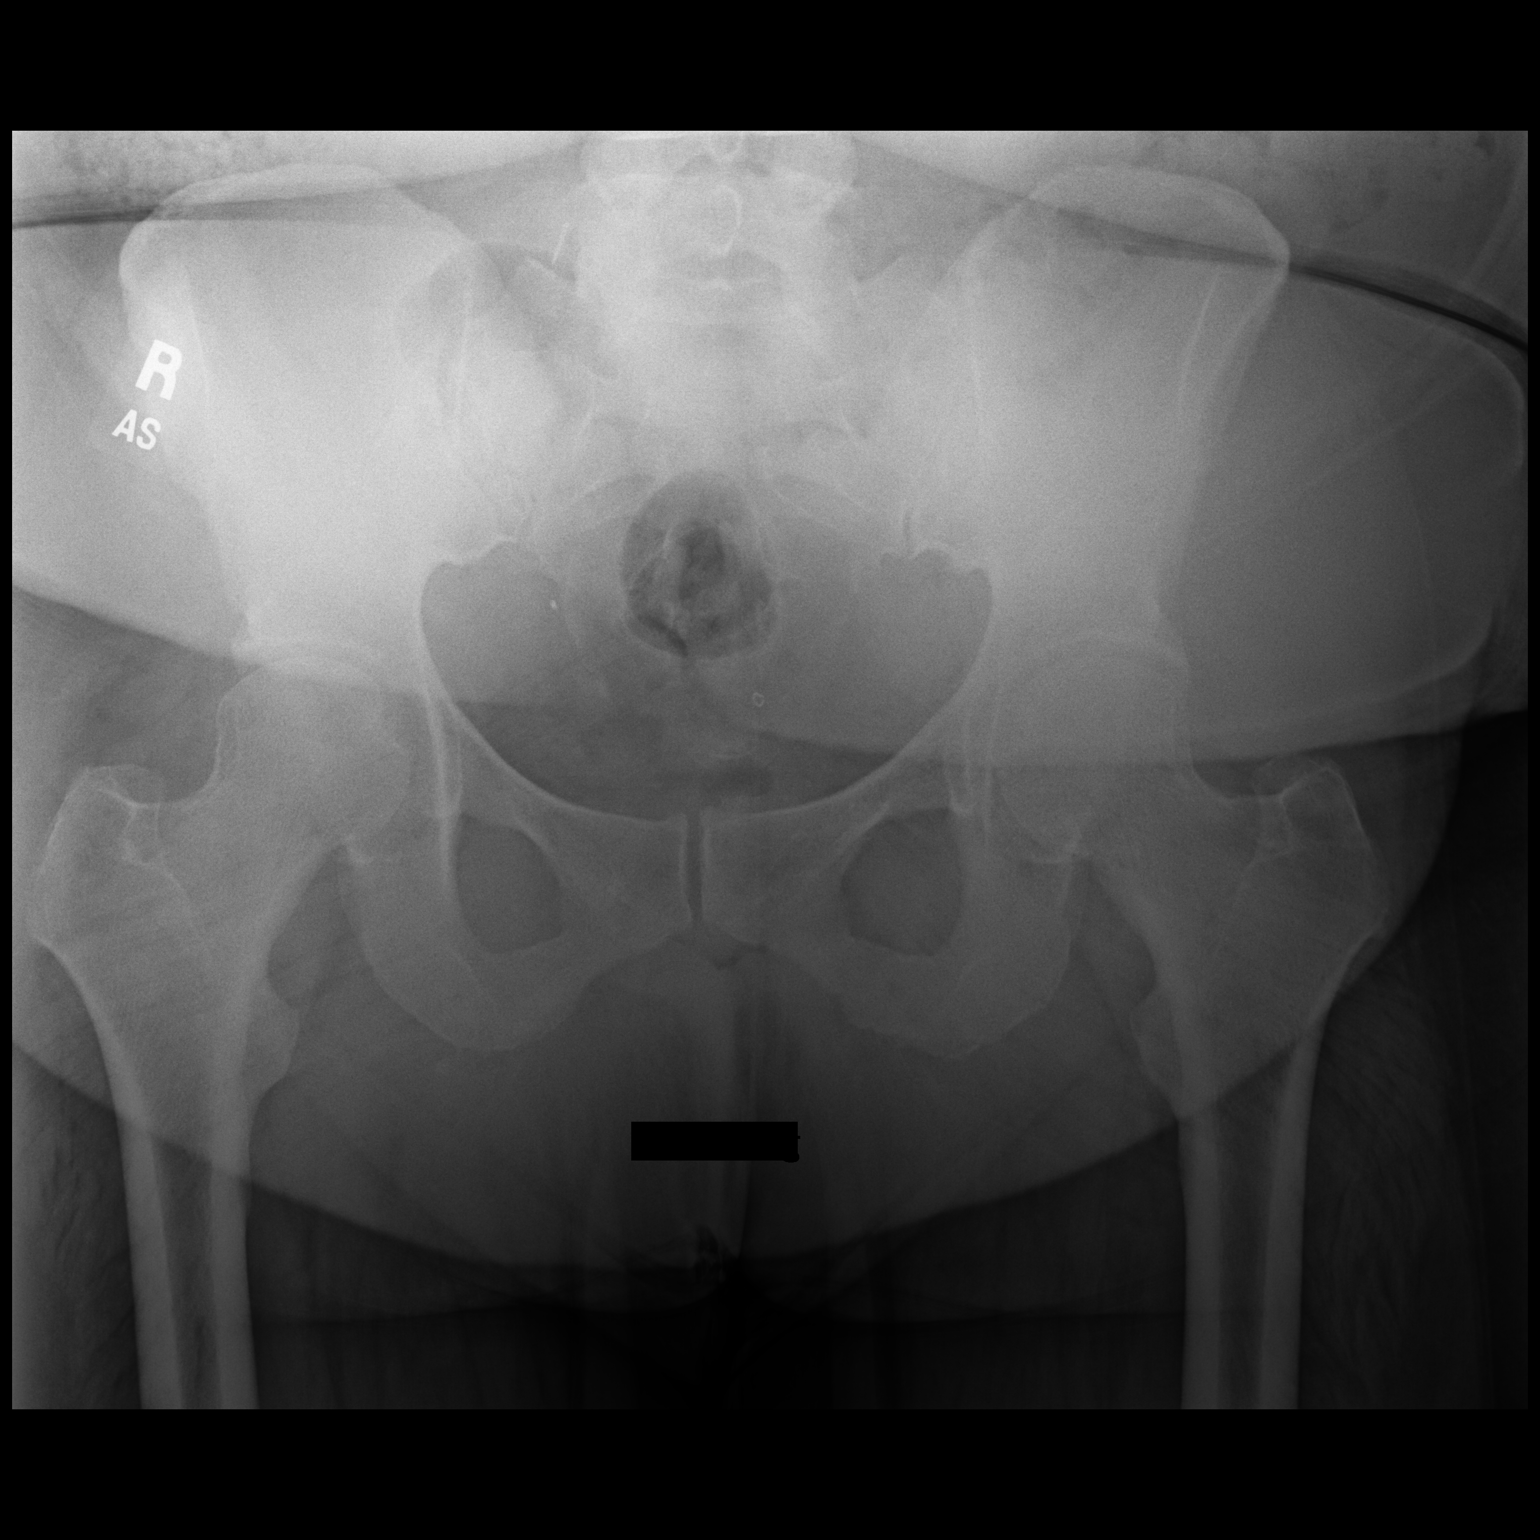

[1 of 1 positions shown; findings below may reference images not displayed]

FINDINGS: No pelvic fracture or diastasis. No evidence of hip dislocation on
this standing frontal view. No focal loss lesions. No significant
hip arthropathy.
IMPRESSION: No acute osseous abnormality. No evidence of significant hip
arthropathy on this single frontal view.

## 2020-05-11 DIAGNOSIS — Z1231 Encounter for screening mammogram for malignant neoplasm of breast: Secondary | ICD-10-CM | POA: Diagnosis not present

## 2020-05-13 ENCOUNTER — Other Ambulatory Visit: Payer: Self-pay | Admitting: Family Medicine

## 2020-05-14 NOTE — Telephone Encounter (Signed)
Patient is new to my service. Previous PCP graduated, Dr. Ky Barban. Will refill prescriptions today. Patient comes frequently for visits, but has not had labs or been seen for medications since July 2020. Recommend appointment with me to meet each other and do lab work to continue prescriptions.  Gladys Damme, MD [place] Residency, PGY-2

## 2020-05-24 ENCOUNTER — Other Ambulatory Visit: Payer: Self-pay

## 2020-05-24 ENCOUNTER — Ambulatory Visit (INDEPENDENT_AMBULATORY_CARE_PROVIDER_SITE_OTHER): Payer: Medicare Other | Admitting: Cardiology

## 2020-05-24 ENCOUNTER — Encounter: Payer: Self-pay | Admitting: Cardiology

## 2020-05-24 VITALS — BP 104/62 | HR 52 | Ht 66.0 in | Wt 256.0 lb

## 2020-05-24 DIAGNOSIS — I422 Other hypertrophic cardiomyopathy: Secondary | ICD-10-CM

## 2020-05-24 DIAGNOSIS — R0789 Other chest pain: Secondary | ICD-10-CM

## 2020-05-24 DIAGNOSIS — I10 Essential (primary) hypertension: Secondary | ICD-10-CM

## 2020-05-24 DIAGNOSIS — I5032 Chronic diastolic (congestive) heart failure: Secondary | ICD-10-CM | POA: Diagnosis not present

## 2020-05-24 NOTE — Patient Instructions (Signed)
Medication Instructions:  No changes  *If you need a refill on your cardiac medications before your next appointment, please call your pharmacy*   Lab Work: Not needed   Testing/Procedures: Not needed   Follow-Up: At [facility], you and your health needs are our priority.  As part of our continuing mission to provide you with exceptional heart care, we have created designated Provider Care Teams.  These Care Teams include your primary Cardiologist (physician) and Advanced Practice Providers (APPs -  Physician Assistants and Nurse Practitioners) who all work together to provide you with the care you need, when you need it.      Your next appointment:   6 month(s)  The format for your next appointment:   In Person  Provider:   Buford Dresser, MD   Other Instructions Check blood pressure  2 to 3 times a week

## 2020-05-24 NOTE — Progress Notes (Signed)
Cardiology Office Note:    Date:  05/24/2020   ID:  Sophia Wilkinson, DOB 1966/01/09, MRN [id number]  PCP:  Gladys Damme, MD  Cardiologist:  Buford Dresser, MD PhD Pacemaker: Dr. Sallyanne Kuster  Referring MD: Rory Percy, DO   CC: follow up  History of Present Illness:    Sophia Wilkinson is a 54 y.o. female with a hx of hypertrophic cardiomyopathy who is seen in followup today. She was initially seen as a new consult at the request of Dr. Ky Barban for evaluation and management of heart failure on 06/18/18.   Cardiac history: She has a past medical history of hypertrophic cardiomyopathy without significant obstruction, chronic diastolic heart failure, morbid obesity, obstructive sleep apnea on CPAP, and bradycardia s/p PPM. She had been followed by [facility] until moving to [facility] 11/2017. She denies family history of sudden cardiac death. No syncope. No known history of NSVT/VT. Last [facility] note summarized in initial consult note.  Today: Doing fair overall. Still has occasional ache in her chest with movement/position changes. Mild, doesn't last long.  Energy level is better, has lost about 29 lbs. Has made major diet changes. Congratulated on this. Taking care of 6 grandkids over the summer. Was doing the treadmill every day, had to pause recently due to knee pain. Walks stores, takes kids to the park. LE edema is way down.   Denies chest pain, shortness of breath at rest or with normal exertion. No PND, orthopnea, LE edema or unexpected weight gain. No syncope or palpitations.  Taking an average of 15 mg torsemide daily, doing well with this. Not taking BP at home, unsure if she has had lows. No symptoms today. Discussed that we may be able to scale back on meds as she loses weight.  We again spent time discussing the Covid vaccine.  Past Medical History:  Diagnosis Date  . Allergy   . Anemia   . Blood transfusion without reported diagnosis   . Bradycardia   . Cancer  ([place])   . Cardiomyopathy ([place])   . CHF (congestive heart failure) ([place])   . Chronic pain   . Diabetes mellitus without complication ([place])   . GERD (gastroesophageal reflux disease)   . Heart murmur   . Migraines   . Sleep apnea     Past Surgical History:  Procedure Laterality Date  . ABDOMINAL HYSTERECTOMY  2016  . HERNIA REPAIR    . LAPAROSCOPY ABDOMEN DIAGNOSTIC    . LEEP    . PACEMAKER IMPLANT  2016  . TUBAL LIGATION  1999    Current Medications: Current Outpatient Medications on File Prior to Visit  Medication Sig  . acetaminophen (TYLENOL) 500 MG tablet Take by mouth.  Marland Kitchen amitriptyline (ELAVIL) 25 MG tablet Take 1 tablet (25 mg total) by mouth at bedtime.  . B Complex-C-E-Zn (ZINC-VITES PO) Take 3 each by mouth daily.  . candesartan (ATACAND) 4 MG tablet Take 1/2 (one-half) tablet by mouth once daily  . ELDERBERRY PO Take 2 each by mouth daily.  . EQ LORATADINE 10 MG tablet Take 1 tablet by mouth once daily  . metoprolol succinate (TOPROL-XL) 25 MG 24 hr tablet Take 3 tablets by mouth once daily  . Multiple Vitamin (MULTIVITAMIN WITH MINERALS) TABS tablet Take 1 tablet by mouth daily.  . potassium chloride (KLOR-CON) 10 MEQ tablet TAKE 1 TO 2 TABLETS BY MOUTH ONCE DAILY AS DIRECTED  . Probiotic Product (PROBIOTIC-10 ULTIMATE PO) Take 1 each by mouth daily.  Marland Kitchen spironolactone (  ALDACTONE) 25 MG tablet Take 1/2 (one-half) tablet by mouth once daily  . torsemide (DEMADEX) 10 MG tablet Take 1-2 tablets (10-20 mg total) by mouth daily.   No current facility-administered medications on file prior to visit.     Allergies:   Patient has no known allergies.   Social History   Tobacco Use  . Smoking status: Never Smoker  . Smokeless tobacco: Never Used  Vaping Use  . Vaping Use: Never used  Substance Use Topics  . Alcohol use: Never  . Drug use: Never    Family History: The patient's family history includes Alcohol abuse in her father, mother, and sister; Diabetes in  her daughter, maternal grandmother, and sister; Drug abuse in her father; Heart disease in her maternal grandmother and paternal grandmother; Hyperlipidemia in her maternal grandmother; Hypertension in her daughter; Liver disease in her mother. There is no history of Colon cancer, Esophageal cancer, Rectal cancer, or Stomach cancer.  Mother passed away age 64 from liver disease. Father passed away at age 45 from overdose. Grandparents passed in their 54s from cancer, MS, and heart disease. Brother living at age 51.   ROS:   Please see the history of present illness.  ROS otherwise negative  EKGs/Labs/Other Studies Reviewed:    The following studies were reviewed today: Notes through [place] in initial consult note.   07/07/18 Study Conclusions - Stress ECG conclusions: The stress ECG was normal. This score   predicts a low risk of cardiac events. - Impressions: Normal stress echo  Impressions: - Normal study after maximal exercise. Normal stress echo  HCM treadmill stress echo 01/09/2017: CONCLUSIONS  Non-obstructive HCM.   Reduced exercise capacity for age (7.2 METs, 90% of age predicted).   Appropriate blood pressure response to exertion.   No evidence of exercise induced ischemia by echocardiography at a sub- adequate heart rate. Please note that the HCM echocardiogram is focusedon the assessment of hemodynamics (peak outflow tract gradient) at peakstress rather than wall motion analysis. Wall motion images are collectedlater after termination of stress and thus it is likely that ischemic findings will be missed at lower heart rates. Inducible ischemia at higher workloadscannot be ruled out.While no wall motion abnormalities were noted onthis study, this study was performed to evaluate LV outflow obstructionwith exercise rather than being optimized to evaluate for stress inducedwall motion.  Calculated gradients assume an LA pressure of 10 mmHg. Gradients  maybe underestimated if the true peak MR velocity is not captured. Directgradients may be overestimated if the Doppler signal is contaminated.  Treadmill Results  Protocol: Bruce Duration (min:sec):06:11 METS:  Stage:Amyl NitrateBP: (mmHg) ZO:XWRUEAVW:UJW HR: 131% MPHR 77  (mL inhaled)Sys Diast(bpm)    Baseline111 52 65  151 77 75  2 168 74 131  Peak 131  1 min recovery135 66 100  Recovery117 46 77  Recovery110 50 72   Reason for Termination:Dyspnea  Symptoms:Complained of shortness of breath and appeared dyspneic throughout exercise.  Recovery Time (min:sec):05:33   FINDINGS   EKG FINDINGS  Baseline:  Normal sinus rhythm, nonspecific T-wave changes.   Peak:  Sinus tachycardia with occasional PVCs, no ischemic EKG changes. METS:7.20  Hyperdynamic LV systolic function, LVEF estimated at 75-80%. All LVwalls augment appropriately.Near LV cavity obliteration. Peak LVOTgradient with exercise of 14 mmHg.Chordal SAM.  Recovery:  Normal sinus rhythm with frequent PVCs and ventricular couplet in earlyrecovery, no ischemic EKG changes.  Hyperdynamic LV systolic function, LVEF estimated at 70%.No regionalwall motion abnormalities. Chordal SAM.   Echo  01/09/2017 CONCLUSIONS   Normal left ventricular cavity size. Severe asymetric left ventricular hypertropy (septum 2.4 cm, posterior wall 1.1 cm).Increased (hyperdynamic) left ventricular ejection fraction. LVEF estimated at 70-75%.No regional wall motion abnormalities.Normal diastolic filling pattern for age.   Normal right  ventricular size. Normal right ventricular global  systolic function.    Systolic anterior motion of the mitral valve chordae. No significant LVOT gradient at rest or with Valsalva (< 10 mmHg); post-PVC gradient 14 mmHg.   No significant valve dysfunction.   EKG:  EKG is personally reviewed today.  The ekg ordered 11/25/19 demonstrates normal sinus rhythm.  Recent Labs: No results found for requested labs within last 8760 hours.  Recent Lipid Panel    Component Value Date/Time   CHOL 213 (H) 05/23/2019 1547   TRIG 152 (H) 05/23/2019 1547   HDL 44 05/23/2019 1547   CHOLHDL 4.8 (H) 05/23/2019 1547   LDLCALC 139 (H) 05/23/2019 1547    Physical Exam:    VS:  BP 104/62   Pulse (!) 52   Ht 5\' 6"  (1.676 m)   Wt 256 lb (116.1 kg)   SpO2 98%   BMI 41.32 kg/m     Wt Readings from Last 3 Encounters:  05/24/20 256 lb (116.1 kg)  04/19/20 259 lb (117.5 kg)  03/08/20 267 lb (121.1 kg)   GEN: Well nourished, well developed in no acute distress HEENT: Normal, moist mucous membranes NECK: No JVD CARDIAC: regular rhythm, normal S1 and S2, no rubs or gallops. 1/5 SEM, no increase with valsalva VASCULAR: Radial and DP pulses 2+ bilaterally. No carotid bruits RESPIRATORY:  Clear to auscultation without rales, wheezing or rhonchi  ABDOMEN: Soft, non-tender, non-distended MUSCULOSKELETAL:  Ambulates independently SKIN: Warm and dry, no edema NEUROLOGIC:  Alert and oriented x 3. No focal neuro deficits noted. PSYCHIATRIC:  Normal affect   ASSESSMENT:    1. Hypertrophic cardiomyopathy ([place])   2. Other chest pain   3. Morbid obesity ([place])   4. Essential hypertension   5. Chronic diastolic congestive heart failure ([facility])    PLAN:    Chest pain: mild, similar to prior -No suggestion of ischemia or obstruction on prior stress test. Low suspicion for cardiac etiology -counseled on red flag warning signs that need immediate medical attention -she will call if it becomes stronger,  more frequent, or more bothersome without being severe  Hypertrophic cardiomyopathy: stable -no red flags of syncope, family history of SCD  -stress echo done as above -previously followed at [facility], notes in care everywhere -continue metoprolol succinate 75 mg daily  Morbid obesity: congratulated on weight loss  Hypertension, chronic diastolic heart failure:  -euvolemic, BP borderline low -discussed home BP checks, may need to decrease medications as she loses weight -continue candesartan 2 mg, spironolactone 12.5 mg, and torsemide 10 mg daily -balance with avoiding dehydration given HOCM.  Nutrition and exercise counseling -recommend heart healthy/Mediterranean diet, with whole grains, fruits, vegetable, fish, lean meats, nuts, and olive oil. Limit salt. -recommend moderate walking, 3-5 times/week for 30-50 minutes each session. Aim for at least 150 minutes.week. Goal should be pace of 3 miles/hours, or walking 1.5 miles in 30 minutes   Not addressed in depth today: Pacemaker: followed by Dr. Sallyanne Kuster  Plan for follow up: 6 mos or sooner PRN  Buford Dresser, MD, PhD [place]  [facility]   Medication Adjustments/Labs and Tests Ordered: Current medicines are reviewed at length with the patient today.  Concerns regarding medicines are outlined above.  No orders of the defined types were placed in this encounter.  No orders of the defined types were placed in this encounter.   Patient Instructions  Medication Instructions:  No changes  *If you need a refill on your cardiac medications before your next appointment, please call your pharmacy*   Lab Work: Not needed   Testing/Procedures: Not needed   Follow-Up: At [facility], you and your health needs are our priority.  As part of our continuing mission to provide you with exceptional heart care, we have created designated Provider Care Teams.  These Care Teams include your primary Cardiologist  (physician) and Advanced Practice Providers (APPs -  Physician Assistants and Nurse Practitioners) who all work together to provide you with the care you need, when you need it.      Your next appointment:   6 month(s)  The format for your next appointment:   In Person  Provider:   Buford Dresser, MD   Other Instructions Check blood pressure  2 to 3 times a week      Signed, Buford Dresser, MD PhD 05/24/2020   [place]

## 2020-05-28 ENCOUNTER — Ambulatory Visit (INDEPENDENT_AMBULATORY_CARE_PROVIDER_SITE_OTHER): Payer: Medicare Other | Admitting: *Deleted

## 2020-05-28 DIAGNOSIS — I422 Other hypertrophic cardiomyopathy: Secondary | ICD-10-CM

## 2020-05-28 DIAGNOSIS — I5032 Chronic diastolic (congestive) heart failure: Secondary | ICD-10-CM

## 2020-05-30 LAB — CUP PACEART REMOTE DEVICE CHECK
Battery Remaining Longevity: 142 mo
Battery Remaining Percentage: 95.5 %
Battery Voltage: 2.99 V
Brady Statistic AP VP Percent: 1 %
Brady Statistic AP VS Percent: 19 %
Brady Statistic AS VP Percent: 1 %
Brady Statistic AS VS Percent: 80 %
Brady Statistic RA Percent Paced: 18 %
Brady Statistic RV Percent Paced: 1.2 %
Date Time Interrogation Session: 20210726020015
Implantable Lead Implant Date: 20160913
Implantable Lead Implant Date: 20160913
Implantable Lead Location: 753859
Implantable Lead Location: 753860
Implantable Lead Model: 1948
Implantable Pulse Generator Implant Date: 20160913
Lead Channel Impedance Value: 340 Ohm
Lead Channel Impedance Value: 560 Ohm
Lead Channel Pacing Threshold Amplitude: 0.625 V
Lead Channel Pacing Threshold Amplitude: 1 V
Lead Channel Pacing Threshold Pulse Width: 0.4 ms
Lead Channel Pacing Threshold Pulse Width: 0.4 ms
Lead Channel Sensing Intrinsic Amplitude: 2.7 mV
Lead Channel Sensing Intrinsic Amplitude: 3.7 mV
Lead Channel Setting Pacing Amplitude: 1.25 V
Lead Channel Setting Pacing Amplitude: 1.625
Lead Channel Setting Pacing Pulse Width: 0.4 ms
Lead Channel Setting Sensing Sensitivity: 0.7 mV
Pulse Gen Model: 2240
Pulse Gen Serial Number: 7676319

## 2020-05-30 NOTE — Progress Notes (Signed)
Remote pacemaker transmission.   

## 2020-06-06 ENCOUNTER — Ambulatory Visit (INDEPENDENT_AMBULATORY_CARE_PROVIDER_SITE_OTHER): Payer: Medicare Other | Admitting: Family Medicine

## 2020-06-06 ENCOUNTER — Encounter: Payer: Self-pay | Admitting: Family Medicine

## 2020-06-06 ENCOUNTER — Other Ambulatory Visit: Payer: Self-pay

## 2020-06-06 VITALS — BP 96/60 | HR 53 | Ht 66.0 in | Wt 255.1 lb

## 2020-06-06 DIAGNOSIS — G8929 Other chronic pain: Secondary | ICD-10-CM

## 2020-06-06 DIAGNOSIS — S29012D Strain of muscle and tendon of back wall of thorax, subsequent encounter: Secondary | ICD-10-CM

## 2020-06-06 DIAGNOSIS — Z131 Encounter for screening for diabetes mellitus: Secondary | ICD-10-CM

## 2020-06-06 DIAGNOSIS — R202 Paresthesia of skin: Secondary | ICD-10-CM

## 2020-06-06 DIAGNOSIS — I10 Essential (primary) hypertension: Secondary | ICD-10-CM | POA: Diagnosis not present

## 2020-06-06 DIAGNOSIS — M62830 Muscle spasm of back: Secondary | ICD-10-CM

## 2020-06-06 DIAGNOSIS — Z6841 Body Mass Index (BMI) 40.0 and over, adult: Secondary | ICD-10-CM

## 2020-06-06 DIAGNOSIS — E782 Mixed hyperlipidemia: Secondary | ICD-10-CM | POA: Diagnosis not present

## 2020-06-06 DIAGNOSIS — M25512 Pain in left shoulder: Secondary | ICD-10-CM

## 2020-06-06 DIAGNOSIS — E66813 Obesity, class 3: Secondary | ICD-10-CM

## 2020-06-06 DIAGNOSIS — Z0000 Encounter for general adult medical examination without abnormal findings: Secondary | ICD-10-CM | POA: Diagnosis not present

## 2020-06-06 DIAGNOSIS — R7303 Prediabetes: Secondary | ICD-10-CM

## 2020-06-06 DIAGNOSIS — R200 Anesthesia of skin: Secondary | ICD-10-CM

## 2020-06-06 LAB — POCT GLYCOSYLATED HEMOGLOBIN (HGB A1C): HbA1c, POC (prediabetic range): 5.7 % (ref 5.7–6.4)

## 2020-06-06 MED ORDER — BACLOFEN 10 MG PO TABS: 10.0000 mg | ORAL_TABLET | Freq: Three times a day (TID) | ORAL | 0 refills | Status: DC

## 2020-06-06 NOTE — Assessment & Plan Note (Addendum)
Patient has made considerable progress with goals and has lost 30 lbs. Congratulated her on her hard work and encouraged her to keep going, which she is doing. - lipid panel obtained: improvement in triglycerides from last year, total cholesterol, HDL, and LDL stable. No indication for statin as patient is now pre-diabetic, normal stress echo 1 year ago, ASCVD risk calculated <7.5% - Continue lifestyle modifications

## 2020-06-06 NOTE — Patient Instructions (Addendum)
It was a pleasure to see you today!  1. We got blood work, I will let you know about any abnormalities.  2. If you want a referral to sports medicine or physical therapy, let the office know and I'm happy to put those in for your shoulder [phone].  3. For your back, continue pain medication and heating pads, and you may also use the muscle relaxant up to 3 times a day, I recommend doing this at night or when you know you do not have to drive somewhere.  4. Follow up with me as needed, keep up the good work!  Dr. Chauncey Reading  Health Maintenance, Female Adopting a healthy lifestyle and getting preventive care are important in promoting health and wellness. Ask your health care provider about:  The right schedule for you to have regular tests and exams.  Things you can do on your own to prevent diseases and keep yourself healthy. What should I know about diet, weight, and exercise? Eat a healthy diet   Eat a diet that includes plenty of vegetables, fruits, low-fat dairy products, and lean protein.  Do not eat a lot of foods that are high in solid fats, added sugars, or sodium. Maintain a healthy weight Body mass index (BMI) is used to identify weight problems. It estimates body fat based on height and weight. Your health care provider can help determine your BMI and help you achieve or maintain a healthy weight. Get regular exercise Get regular exercise. This is one of the most important things you can do for your health. Most adults should:  Exercise for at least 150 minutes each week. The exercise should increase your heart rate and make you sweat (moderate-intensity exercise).  Do strengthening exercises at least twice a week. This is in addition to the moderate-intensity exercise.  Spend less time sitting. Even light physical activity can be beneficial. Watch cholesterol and blood lipids Have your blood tested for lipids and cholesterol at 53 years of age, then have this test  every 5 years. Have your cholesterol levels checked more often if:  Your lipid or cholesterol levels are high.  You are older than 53 years of age.  You are at high risk for heart disease. What should I know about cancer screening? Depending on your health history and family history, you may need to have cancer screening at various ages. This may include screening for:  Breast cancer.  Cervical cancer.  Colorectal cancer.  Skin cancer.  Lung cancer. What should I know about heart disease, diabetes, and high blood pressure? Blood pressure and heart disease  High blood pressure causes heart disease and increases the risk of stroke. This is more likely to develop in people who have high blood pressure readings, are of African descent, or are overweight.  Have your blood pressure checked: ? Every 3-5 years if you are 51-42 years of age. ? Every year if you are 8 years old or older. Diabetes Have regular diabetes screenings. This checks your fasting blood sugar level. Have the screening done:  Once every three years after age 27 if you are at a normal weight and have a low risk for diabetes.  More often and at a younger age if you are overweight or have a high risk for diabetes. What should I know about preventing infection? Hepatitis B If you have a higher risk for hepatitis B, you should be screened for this virus. Talk with your health care provider to find out if  you are at risk for hepatitis B infection. Hepatitis C Testing is recommended for:  Everyone born from [birth year] through 1964.  Anyone with known risk factors for hepatitis C. Sexually transmitted infections (STIs)  Get screened for STIs, including gonorrhea and chlamydia, if: ? You are sexually active and are younger than 53 years of age. ? You are older than 53 years of age and your health care provider tells you that you are at risk for this type of infection. ? Your sexual activity has changed since you were  last screened, and you are at increased risk for chlamydia or gonorrhea. Ask your health care provider if you are at risk.  Ask your health care provider about whether you are at high risk for HIV. Your health care provider may recommend a prescription medicine to help prevent HIV infection. If you choose to take medicine to prevent HIV, you should first get tested for HIV. You should then be tested every 3 months for as long as you are taking the medicine. Pregnancy  If you are about to stop having your period (premenopausal) and you may become pregnant, seek counseling before you get pregnant.  Take 400 to 800 micrograms (mcg) of folic acid every day if you become pregnant.  Ask for birth control (contraception) if you want to prevent pregnancy. Osteoporosis and menopause Osteoporosis is a disease in which the bones lose minerals and strength with aging. This can result in bone fractures. If you are 28 years old or older, or if you are at risk for osteoporosis and fractures, ask your health care provider if you should:  Be screened for bone loss.  Take a calcium or vitamin D supplement to lower your risk of fractures.  Be given hormone replacement therapy (HRT) to treat symptoms of menopause. Follow these instructions at home: Lifestyle  Do not use any products that contain nicotine or tobacco, such as cigarettes, e-cigarettes, and chewing tobacco. If you need help quitting, ask your health care provider.  Do not use street drugs.  Do not share needles.  Ask your health care provider for help if you need support or information about quitting drugs. Alcohol use  Do not drink alcohol if: ? Your health care provider tells you not to drink. ? You are pregnant, may be pregnant, or are planning to become pregnant.  If you drink alcohol: ? Limit how much you use to 0-1 drink a day. ? Limit intake if you are breastfeeding.  Be aware of how much alcohol is in your drink. In the [place]  one drink equals one 12 oz bottle of beer (355 mL), one 5 oz glass of wine (148 mL), or one 1 oz glass of hard liquor (44 mL). General instructions  Schedule regular health, dental, and eye exams.  Stay current with your vaccines.  Tell your health care provider if: ? You often feel depressed. ? You have ever been abused or do not feel safe at home. Summary  Adopting a healthy lifestyle and getting preventive care are important in promoting health and wellness.  Follow your health care provider's instructions about healthy diet, exercising, and getting tested or screened for diseases.  Follow your health care provider's instructions on monitoring your cholesterol and blood pressure. This information is not intended to replace advice given to you by your health care provider. Make sure you discuss any questions you have with your health care provider. Document Revised: 10/13/2018 Document Reviewed: 10/13/2018 Elsevier Patient Education  2020 Elsevier  Inc.  

## 2020-06-06 NOTE — Assessment & Plan Note (Signed)
Patient has reported h/o rotator cuff injury. She had previously been seen by an orthopedist and had considered surgery, but due to her dilated cardiomyopathy, she decided not to go ahead with surgery-- very reasonable choice. We discussed the pain and she is not sure her insurance would cover PT, she has had steroid injections in the past. Offered her referrals to PT or sports medicine. Patient is unsure at this time, will call to request either if she decides.

## 2020-06-06 NOTE — Assessment & Plan Note (Signed)
A1c 5.7 today! Major progress from last visit, down from 6.4%. Almost entirely out of pre-diabetes range. Encouraged her to continue with dieting and exercise.

## 2020-06-06 NOTE — Progress Notes (Signed)
SUBJECTIVE:   CHIEF COMPLAINT / HPI: f/u  Healthcare Maintenance: Patient received 1 dose of COVID-19 vaccine, next dose scheduled for 06/25/20 Mammogram in July reportedly normal- will send records  Pre-Diabetes: Patient has been dieting and exercising, regularly attending nutrition appts She has lost 30 lbs! A1c check today   Left upper back pain: Patient has had intermittent sharp pain on her  Left upper back underneath her scapula that has been present since about March of this year. She has used tylenol, voltaren gel, and a heating pad with minimal relief. She cannot think of any trauma or accident that brought it on, no lifting heavy objects. She does have an old rotator cuff injury on the left side as well.  Bilateral numbness in lateral thighs: Patient has noticed for several months that she has numbness and tingling on her lateral upper thighs. She notices this only at night and early in the morning. She is awakened by numbness and tingling on the outer thigh on the side she is sleeping on, when she switches positions, it goes away. When she wakes up and gets up, it goes away. She sleeps in a nightgown and does not wear anything with a waist band when sleeping.  Dilated Cardiomyopathy: Stable, saw cardiologist recently. No changes.  HTN: BP within goal, on low side, asymptomatic, no dizziness or fainting  H/O Bereavement associated depression: Patient had elevated PHQ-9 in 10/28/19 following the death of her son. She still has some bad days, but reports feeling better overall.  PERTINENT  PMH / PSH: pre-diabetes, morbid obesity, HTN, dilated cardiomyopathy, h/o depression  OBJECTIVE:   BP 96/60 Comment: provider informed  Pulse (!) 53 Comment: provider informed  Ht 5\' 6"  (1.676 m)   Wt 255 lb 2 oz (115.7 kg)   SpO2 99%   BMI 41.18 kg/m   Physical Exam Vitals and nursing note reviewed.  Constitutional:      General: She is not in acute distress.     Appearance: Normal appearance. She is obese. She is not ill-appearing, toxic-appearing or diaphoretic.  HENT:     Head: Normocephalic and atraumatic.     Nose: Nose normal.  Eyes:     Conjunctiva/sclera: Conjunctivae normal.     Pupils: Pupils are equal, round, and reactive to light.  Cardiovascular:     Rate and Rhythm: Normal rate and regular rhythm.     Pulses: Normal pulses.     Heart sounds: Normal heart sounds.  Pulmonary:     Effort: Pulmonary effort is normal.     Breath sounds: Normal breath sounds.  Abdominal:     General: Bowel sounds are normal.  Musculoskeletal:     Cervical back: Normal range of motion and neck supple.     Right lower leg: No edema.     Left lower leg: No edema.     Comments: RUE: full ROM, no tenderness to palpation, no warmth, erythema, or edema, Hawkins sign negative  LUE: limited ROM to 115*, no TTP, no warmth, erythema, or edema; Hawkins sign positive  Left upper back: tenderness to palpation underneath scapula with mild fasiculations (along ~T4), no step offs  Lumbar back: no paraspinal muscle tenderness, no step offs  RLE: full ROM intact in hip, SLR negative  LLE: full ROM intact in hip, tight ham string, SLR negative  Lymphadenopathy:     Cervical: No cervical adenopathy.  Skin:    General: Skin is warm and dry.     Capillary  Refill: Capillary refill takes less than 2 seconds.  Neurological:     General: No focal deficit present.     Mental Status: She is alert. Mental status is at baseline.     Cranial Nerves: No cranial nerve deficit.  Psychiatric:        Mood and Affect: Mood normal.        Behavior: Behavior normal.    Depression screen [facility] 2/9 06/06/2020 01/11/2020 10/14/2019 05/23/2019 05/23/2019  Decreased Interest 0 0 1 1 1   Down, Depressed, Hopeless 0 0 1 1 1   PHQ - 2 Score 0 0 2 2 2   Altered sleeping 0 - 1 1 1   Tired, decreased energy 1 - 2 1 1   Change in appetite 0 - 3 1 1   Feeling bad or failure about yourself  0 - 1 0  0  Trouble concentrating 0 - 0 0 0  Moving slowly or fidgety/restless 0 - 0 0 0  Suicidal thoughts 0 - 0 0 0  PHQ-9 Score 1 - 9 5 5   Difficult doing work/chores Not difficult at all - - Somewhat difficult Somewhat difficult    ASSESSMENT/PLAN:   Numbness and tingling of upper and lower extremities of both sides Only noted with dependent positioning while sleeping, likely 2/2 morbid obesity. Patient has done excellent work with losing 30 lbs, recommend she continue to lose weight as she has likely not reached the threshold of weight loss to make a difference yet. No red flag symptoms. Continue weight loss and nutrition counseling.  Class 3 severe obesity with body mass index (BMI) of 45.0 to 49.9 in adult [facility]) Patient has made considerable progress with goals and has lost 30 lbs. Congratulated her on her hard work and encouraged her to keep going, which she is doing. - lipid panel obtained: improvement in triglycerides from last year, total cholesterol, HDL, and LDL stable. No indication for statin as patient is now pre-diabetic, normal stress echo 1 year ago, ASCVD risk calculated <7.5% - Continue lifestyle modifications  Chronic left shoulder pain Patient has reported h/o rotator cuff injury. She had previously been seen by an orthopedist and had considered surgery, but due to her dilated cardiomyopathy, she decided not to go ahead with surgery-- very reasonable choice. We discussed the pain and she is not sure her insurance would cover PT, she has had steroid injections in the past. Offered her referrals to PT or sports medicine. Patient is unsure at this time, will call to request either if she decides.  Prediabetes A1c 5.7 today! Major progress from last visit, down from 6.4%. Almost entirely out of pre-diabetes range. Encouraged her to continue with dieting and exercise.  Essential hypertension BMP WNL     Gladys Damme, MD [place]

## 2020-06-06 NOTE — Assessment & Plan Note (Signed)
Only noted with dependent positioning while sleeping, likely 2/2 morbid obesity. Patient has done excellent work with losing 30 lbs, recommend she continue to lose weight as she has likely not reached the threshold of weight loss to make a difference yet. No red flag symptoms. Continue weight loss and nutrition counseling.

## 2020-06-07 LAB — BASIC METABOLIC PANEL
BUN/Creatinine Ratio: 17 (ref 9–23)
BUN: 16 mg/dL (ref 6–24)
CO2: 26 mmol/L (ref 20–29)
Calcium: 10.1 mg/dL (ref 8.7–10.2)
Chloride: 102 mmol/L (ref 96–106)
Creatinine, Ser: 0.93 mg/dL (ref 0.57–1.00)
GFR calc Af Amer: 82 mL/min/{1.73_m2} (ref >59)
GFR calc non Af Amer: 71 mL/min/{1.73_m2} (ref >59)
Glucose: 118 mg/dL — ABNORMAL HIGH (ref 65–99)
Potassium: 4.3 mmol/L (ref 3.5–5.2)
Sodium: 143 mmol/L (ref 134–144)

## 2020-06-07 LAB — LIPID PANEL
Chol/HDL Ratio: 5.2 ratio — ABNORMAL HIGH (ref 0.0–4.4)
Cholesterol, Total: 204 mg/dL — ABNORMAL HIGH (ref 100–199)
HDL: 39 mg/dL — ABNORMAL LOW (ref >39)
LDL Chol Calc (NIH): 151 mg/dL — ABNORMAL HIGH (ref 0–99)
Triglycerides: 77 mg/dL (ref 0–149)
VLDL Cholesterol Cal: 14 mg/dL (ref 5–40)

## 2020-06-10 DIAGNOSIS — M62830 Muscle spasm of back: Secondary | ICD-10-CM | POA: Insufficient documentation

## 2020-06-10 NOTE — Assessment & Plan Note (Signed)
Pain below left shoulder is musculoligamentous in nature. Continue conservative tx with heat, voltaren gel, tylenol - baclofen 10 mg TID PRN, counseled on safe usage (see AVS) - stretches given

## 2020-06-10 NOTE — Assessment & Plan Note (Signed)
BMP WNL

## 2020-07-11 ENCOUNTER — Other Ambulatory Visit: Payer: Self-pay | Admitting: Family Medicine

## 2020-07-17 ENCOUNTER — Encounter: Payer: Self-pay | Admitting: Family Medicine

## 2020-07-17 MED ORDER — METOPROLOL SUCCINATE ER 25 MG PO TB24: 75.0000 mg | ORAL_TABLET | Freq: Every day | ORAL | 1 refills | Status: DC

## 2020-08-08 ENCOUNTER — Other Ambulatory Visit: Payer: Self-pay | Admitting: Family Medicine

## 2020-08-10 ENCOUNTER — Encounter: Payer: Self-pay | Admitting: Cardiology

## 2020-08-28 ENCOUNTER — Ambulatory Visit (INDEPENDENT_AMBULATORY_CARE_PROVIDER_SITE_OTHER): Payer: Medicare Other

## 2020-08-28 DIAGNOSIS — I422 Other hypertrophic cardiomyopathy: Secondary | ICD-10-CM

## 2020-08-28 LAB — CUP PACEART REMOTE DEVICE CHECK
Battery Remaining Longevity: 143 mo
Battery Remaining Percentage: 95.5 %
Battery Voltage: 2.98 V
Brady Statistic AP VP Percent: 1 %
Brady Statistic AP VS Percent: 18 %
Brady Statistic AS VP Percent: 1.1 %
Brady Statistic AS VS Percent: 81 %
Brady Statistic RA Percent Paced: 17 %
Brady Statistic RV Percent Paced: 1.4 %
Date Time Interrogation Session: 20211025020014
Implantable Lead Implant Date: 20160913
Implantable Lead Implant Date: 20160913
Implantable Lead Location: 753859
Implantable Lead Location: 753860
Implantable Lead Model: 1948
Implantable Pulse Generator Implant Date: 20160913
Lead Channel Impedance Value: 360 Ohm
Lead Channel Impedance Value: 580 Ohm
Lead Channel Pacing Threshold Amplitude: 0.625 V
Lead Channel Pacing Threshold Amplitude: 0.875 V
Lead Channel Pacing Threshold Pulse Width: 0.4 ms
Lead Channel Pacing Threshold Pulse Width: 0.4 ms
Lead Channel Sensing Intrinsic Amplitude: 2.2 mV
Lead Channel Sensing Intrinsic Amplitude: 3.8 mV
Lead Channel Setting Pacing Amplitude: 1.125
Lead Channel Setting Pacing Amplitude: 1.625
Lead Channel Setting Pacing Pulse Width: 0.4 ms
Lead Channel Setting Sensing Sensitivity: 0.7 mV
Pulse Gen Model: 2240
Pulse Gen Serial Number: 7676319

## 2020-09-03 NOTE — Progress Notes (Signed)
Remote pacemaker transmission.   

## 2020-09-12 ENCOUNTER — Other Ambulatory Visit: Payer: Self-pay | Admitting: Family Medicine

## 2020-09-13 MED ORDER — TORSEMIDE 10 MG PO TABS: 10.0000 mg | ORAL_TABLET | Freq: Every day | ORAL | 0 refills | Status: DC

## 2020-10-04 DIAGNOSIS — M25512 Pain in left shoulder: Secondary | ICD-10-CM | POA: Diagnosis not present

## 2020-10-04 DIAGNOSIS — I509 Heart failure, unspecified: Secondary | ICD-10-CM | POA: Diagnosis not present

## 2020-10-04 DIAGNOSIS — R202 Paresthesia of skin: Secondary | ICD-10-CM | POA: Diagnosis not present

## 2020-10-04 DIAGNOSIS — R5383 Other fatigue: Secondary | ICD-10-CM | POA: Diagnosis not present

## 2020-10-09 DIAGNOSIS — R7303 Prediabetes: Secondary | ICD-10-CM | POA: Diagnosis not present

## 2020-10-09 DIAGNOSIS — R5383 Other fatigue: Secondary | ICD-10-CM | POA: Diagnosis not present

## 2020-10-09 DIAGNOSIS — R202 Paresthesia of skin: Secondary | ICD-10-CM | POA: Diagnosis not present

## 2020-10-15 DIAGNOSIS — M25512 Pain in left shoulder: Secondary | ICD-10-CM | POA: Diagnosis not present

## 2020-11-23 ENCOUNTER — Ambulatory Visit: Payer: Medicare Other | Admitting: Cardiology

## 2020-11-27 ENCOUNTER — Ambulatory Visit (INDEPENDENT_AMBULATORY_CARE_PROVIDER_SITE_OTHER): Payer: Medicare Other

## 2020-11-27 DIAGNOSIS — I495 Sick sinus syndrome: Secondary | ICD-10-CM | POA: Diagnosis not present

## 2020-12-03 LAB — CUP PACEART REMOTE DEVICE CHECK
Battery Remaining Longevity: 144 mo
Battery Remaining Percentage: 95.5 %
Battery Voltage: 2.98 V
Brady Statistic AP VP Percent: 1 %
Brady Statistic AP VS Percent: 15 %
Brady Statistic AS VP Percent: 1 %
Brady Statistic AS VS Percent: 84 %
Brady Statistic RA Percent Paced: 14 %
Brady Statistic RV Percent Paced: 1.2 %
Date Time Interrogation Session: 20220125020016
Implantable Lead Implant Date: 20160913
Implantable Lead Implant Date: 20160913
Implantable Lead Location: 753859
Implantable Lead Location: 753860
Implantable Lead Model: 1948
Implantable Pulse Generator Implant Date: 20160913
Lead Channel Impedance Value: 360 Ohm
Lead Channel Impedance Value: 600 Ohm
Lead Channel Pacing Threshold Amplitude: 0.625 V
Lead Channel Pacing Threshold Amplitude: 0.75 V
Lead Channel Pacing Threshold Pulse Width: 0.4 ms
Lead Channel Pacing Threshold Pulse Width: 0.4 ms
Lead Channel Sensing Intrinsic Amplitude: 2.8 mV
Lead Channel Sensing Intrinsic Amplitude: 5.1 mV
Lead Channel Setting Pacing Amplitude: 1 V
Lead Channel Setting Pacing Amplitude: 1.625
Lead Channel Setting Pacing Pulse Width: 0.4 ms
Lead Channel Setting Sensing Sensitivity: 0.7 mV
Pulse Gen Model: 2240
Pulse Gen Serial Number: 7676319

## 2020-12-10 NOTE — Progress Notes (Signed)
Remote pacemaker transmission.   

## 2020-12-27 ENCOUNTER — Ambulatory Visit (INDEPENDENT_AMBULATORY_CARE_PROVIDER_SITE_OTHER): Payer: Medicare Other | Admitting: Cardiology

## 2020-12-27 ENCOUNTER — Other Ambulatory Visit: Payer: Self-pay

## 2020-12-27 ENCOUNTER — Encounter: Payer: Self-pay | Admitting: Cardiology

## 2020-12-27 VITALS — BP 116/72 | HR 54 | Ht 66.0 in | Wt 274.8 lb

## 2020-12-27 DIAGNOSIS — R6884 Jaw pain: Secondary | ICD-10-CM

## 2020-12-27 DIAGNOSIS — Z7182 Exercise counseling: Secondary | ICD-10-CM

## 2020-12-27 DIAGNOSIS — Z713 Dietary counseling and surveillance: Secondary | ICD-10-CM

## 2020-12-27 DIAGNOSIS — I5032 Chronic diastolic (congestive) heart failure: Secondary | ICD-10-CM | POA: Diagnosis not present

## 2020-12-27 DIAGNOSIS — I10 Essential (primary) hypertension: Secondary | ICD-10-CM

## 2020-12-27 DIAGNOSIS — I422 Other hypertrophic cardiomyopathy: Secondary | ICD-10-CM

## 2020-12-27 NOTE — Progress Notes (Signed)
Cardiology Office Note:    Date:  12/27/2020   ID:  PEYSON DELAO, DOB [birth year], MRN [id number]  PCP:  Audley Hose, MD  Cardiologist:  Buford Dresser, MD PhD Pacemaker: Dr. Sallyanne Kuster  Referring MD: Gladys Damme, MD   CC: follow up  History of Present Illness:    Sophia Wilkinson is a 55 y.o. female with a hx of hypertrophic cardiomyopathy who is seen in followup today. She was initially seen as a new consult at the request of Dr. Ky Barban for evaluation and management of heart failure on 06/18/18.   Cardiac history: She has a past medical history of hypertrophic cardiomyopathy without significant obstruction, chronic diastolic heart failure, morbid obesity, obstructive sleep apnea on CPAP, and bradycardia s/p PPM. She had been followed by [facility] until moving to [facility] 11/2017. She denies family history of sudden cardiac death. No syncope. No known history of NSVT/VT. Last [facility] note summarized in initial consult note.  Today: Doing fair overall. Had lost weight but now regained about 20 lbs. Her daughter had a stroke in the fall and she went back to [place] to care for her, and her diet suffered. Now she is getting back to healthy eating.  She did have an episode where she bit on food and had jaw pain. This is made somewhat better by massaging her jaw. She is concerned as she knows that women's heart pain can be atypical and sometimes in the jaw. She continues to have intermittent twinges/discomfort under her breasts similar to prior. We discussed options and red flag signs today, see below.   She also notes bilateral thigh intermittent numbness consistent with meralgia parasthetica. Discussed this as well today.  No issues with swelling. No syncope, palpitations, PND, or orthopnea.  Past Medical History:  Diagnosis Date  . Allergy   . Anemia   . Blood transfusion without reported diagnosis   . Bradycardia   . Cancer ([place])   . Cardiomyopathy ([place])   .  CHF (congestive heart failure) ([place])   . Chronic pain   . Diabetes mellitus without complication ([place])   . GERD (gastroesophageal reflux disease)   . Heart murmur   . Migraines   . Sleep apnea     Past Surgical History:  Procedure Laterality Date  . ABDOMINAL HYSTERECTOMY  2016  . HERNIA REPAIR    . LAPAROSCOPY ABDOMEN DIAGNOSTIC    . LEEP    . PACEMAKER IMPLANT  2016  . TUBAL LIGATION  1999    Current Medications: Current Outpatient Medications on File Prior to Visit  Medication Sig  . acetaminophen (TYLENOL) 500 MG tablet Take by mouth.  Marland Kitchen amitriptyline (ELAVIL) 25 MG tablet Take 1 tablet (25 mg total) by mouth at bedtime.  . B Complex-C-E-Zn (ZINC-VITES PO) Take 3 each by mouth daily.  . baclofen (LIORESAL) 10 MG tablet Take 1 tablet (10 mg total) by mouth 3 (three) times daily.  . candesartan (ATACAND) 4 MG tablet Take 1/2 (one-half) tablet by mouth once daily  . ELDERBERRY PO Take 2 each by mouth daily.  Sophia Wilkinson 10 MG tablet Take 1 tablet by mouth once daily  . metoprolol succinate (TOPROL-XL) 25 MG 24 hr tablet Take 3 tablets (75 mg total) by mouth daily.  . Multiple Vitamin (MULTIVITAMIN WITH MINERALS) TABS tablet Take 1 tablet by mouth daily.  . potassium chloride (KLOR-CON) 10 MEQ tablet TAKE 1 TO 2 TABLETS BY MOUTH ONCE DAILY AS DIRECTED  . Probiotic Product (PROBIOTIC-10  ULTIMATE PO) Take 1 each by mouth daily.  Marland Kitchen spironolactone (ALDACTONE) 25 MG tablet Take 1/2 (one-half) tablet by mouth once daily  . torsemide (DEMADEX) 10 MG tablet Take 1-2 tablets (10-20 mg total) by mouth daily.   No current facility-administered medications on file prior to visit.     Allergies:   Patient has no known allergies.   Social History   Tobacco Use  . Smoking status: Never Smoker  . Smokeless tobacco: Never Used  Vaping Use  . Vaping Use: Never used  Substance Use Topics  . Alcohol use: Never  . Drug use: Never    Family History: The patient's family  history includes Alcohol abuse in her father, mother, and sister; Diabetes in her daughter, maternal grandmother, and sister; Drug abuse in her father; Heart disease in her maternal grandmother and paternal grandmother; Hyperlipidemia in her maternal grandmother; Hypertension in her daughter; Liver disease in her mother. There is no history of Colon cancer, Esophageal cancer, Rectal cancer, or Stomach cancer.  Mother passed away age 73 from liver disease. Father passed away at age 54 from overdose. Grandparents passed in their 64s from cancer, MS, and heart disease. Brother living at age 48.   ROS:   Please see the history of present illness.  ROS otherwise negative  EKGs/Labs/Other Studies Reviewed:    The following studies were reviewed today: Notes through [place] in initial consult note.   07/07/18 Study Conclusions - Stress ECG conclusions: The stress ECG was normal. This score   predicts a low risk of cardiac events. - Impressions: Normal stress echo  Impressions: - Normal study after maximal exercise. Normal stress echo  HCM treadmill stress echo 01/09/2017: CONCLUSIONS  Non-obstructive HCM.   Reduced exercise capacity for age (7.2 METs, 90% of age predicted).   Appropriate blood pressure response to exertion.   No evidence of exercise induced ischemia by echocardiography at a sub- adequate heart rate. Please note that the HCM echocardiogram is focusedon the assessment of hemodynamics (peak outflow tract gradient) at peakstress rather than wall motion analysis. Wall motion images are collectedlater after termination of stress and thus it is likely that ischemic findings will be missed at lower heart rates. Inducible ischemia at higher workloadscannot be ruled out.While no wall motion abnormalities were noted onthis study, this study was performed to evaluate LV outflow obstructionwith exercise rather than being optimized to evaluate for stress inducedwall  motion.  Calculated gradients assume an LA pressure of 10 mmHg. Gradients maybe underestimated if the true peak MR velocity is not captured. Directgradients may be overestimated if the Doppler signal is contaminated.  Treadmill Results  Protocol: Bruce Duration (min:sec):06:11 METS:  Stage:Amyl NitrateBP: (mmHg) YK:DXIPJASN:KNL HR: 131% MPHR 77  (mL inhaled)Sys Diast(bpm)    Baseline111 52 65  151 77 75  2 168 74 131  Peak 131  1 min recovery135 66 100  Recovery117 46 77  Recovery110 50 72   Reason for Termination:Dyspnea  Symptoms:Complained of shortness of breath and appeared dyspneic throughout exercise.  Recovery Time (min:sec):05:33   FINDINGS   EKG FINDINGS  Baseline:  Normal sinus rhythm, nonspecific T-wave changes.   Peak:  Sinus tachycardia with occasional PVCs, no ischemic EKG changes. METS:7.20  Hyperdynamic LV systolic function, LVEF estimated at 75-80%. All LVwalls augment appropriately.Near LV cavity obliteration. Peak LVOTgradient with exercise of 14 mmHg.Chordal SAM.  Recovery:  Normal sinus rhythm with frequent PVCs and ventricular couplet in earlyrecovery, no ischemic EKG changes.  Hyperdynamic LV systolic function, LVEF  estimated at 70%.No regionalwall motion abnormalities. Chordal SAM.   Echo 01/09/2017 CONCLUSIONS   Normal left ventricular cavity size. Severe asymetric left ventricular hypertropy (septum 2.4 cm, posterior wall 1.1 cm).Increased (hyperdynamic) left ventricular ejection fraction. LVEF estimated at 70-75%.No regional wall motion  abnormalities.Normal diastolic filling pattern for age.   Normal right ventricular size. Normal right ventricular global  systolic function.    Systolic anterior motion of the mitral valve chordae. No significant LVOT gradient at rest or with Valsalva (< 10 mmHg); post-PVC gradient 14 mmHg.   No significant valve dysfunction.   EKG:  EKG is personally reviewed today.  The ekg ordered today demonstrates sinus bradycardia at 54 bpm, unchanged T wave pattern  Recent Labs: 06/06/2020: BUN 16; Creatinine, Ser 0.93; Potassium 4.3; Sodium 143  Recent Lipid Panel    Component Value Date/Time   CHOL 204 (H) 06/06/2020 1000   TRIG 77 06/06/2020 1000   HDL 39 (L) 06/06/2020 1000   CHOLHDL 5.2 (H) 06/06/2020 1000   LDLCALC 151 (H) 06/06/2020 1000    Physical Exam:    VS:  BP 116/72   Pulse (!) 54   Ht 5\' 6"  (1.676 m)   Wt 274 lb 12.8 oz (124.6 kg)   BMI 44.35 kg/m     Wt Readings from Last 3 Encounters:  12/27/20 274 lb 12.8 oz (124.6 kg)  06/06/20 255 lb 2 oz (115.7 kg)  05/24/20 256 lb (116.1 kg)   GEN: Well nourished, well developed in no acute distress HEENT: Normal, moist mucous membranes NECK: No JVD CARDIAC: regular rhythm, normal S1 and S2, no rubs or gallops. 1/6 systolic ejection murmur. VASCULAR: Radial and DP pulses 2+ bilaterally. No carotid bruits RESPIRATORY:  Clear to auscultation without rales, wheezing or rhonchi  ABDOMEN: Soft, non-tender, non-distended MUSCULOSKELETAL:  Ambulates independently SKIN: Warm and dry, no edema NEUROLOGIC:  Alert and oriented x 3. No focal neuro deficits noted. PSYCHIATRIC:  Normal affect   ASSESSMENT:    1. Hypertrophic cardiomyopathy ([place])   2. Chronic diastolic congestive heart failure (HCC)   3. Jaw pain   4. Morbid obesity ([place])   5. Essential hypertension   6. Exercise counseling   7. Nutritional counseling    PLAN:    Jaw pain, intermittent atypical chest pain -No suggestion of ischemia or obstruction  on prior stress test.  -we discussed possible etiologies of chest pain at length today. Discussed both cardiac and noncardiac causes. As the jaw pain is related to eating and modifiable with external pressure on the jaw, it is unlikely to be cardiac at this time. However, we did discuss that unrelenting/unmodifiable pain, including jaw, shoulder, arm, etc, can be cardiac and needs to be evaluated. -counseled on red flag warning signs that need immediate medical attention -she will call if it becomes stronger, more frequent, or more bothersome without being severe  Hypertrophic cardiomyopathy: stable -no red flags of syncope, family history of SCD  -stress echo as above -pre[facility], notes in care everywhere -continue metoprolol succinate 75 mg daily  Morbid obesity: getting back on healthy diet/reduced calories that helped her be successful previously.  Hypertension, chronic diastolic heart failure:  -euvolemic -BP slightly above goal today but was low at last visit -discussed home BP checks, may need to decrease medications as she loses weight -continue candesartan 2 mg, spironolactone 12.5 mg, and torsemide (averages15 mg) daily -balance with avoiding dehydration given HOCM.  Nutrition and exercise counseling -recommend heart healthy/Mediterranean diet, with whole grains,  fruits, vegetable, fish, lean meats, nuts, and olive oil. Limit salt. -recommend moderate walking, 3-5 times/week for 30-50 minutes each session. Aim for at least 150 minutes.week. Goal should be pace of 3 miles/hours, or walking 1.5 miles in 30 minutes   Not addressed in depth today: Pacemaker: followed by Dr. Sallyanne Kuster  Plan for follow up: 6 mos or sooner PRN  Buford Dresser, MD, PhD, [facility]   Medication Adjustments/Labs and Tests Ordered: Current medicines are reviewed at length with the patient today.  Concerns regarding medicines are outlined above.   Orders Placed This Encounter  Procedures  . EKG 12-Lead   No orders of the defined types were placed in this encounter.   Patient Instructions  Medication Instructions:  Your Physician recommend you continue on your current medication as directed.    *If you need a refill on your cardiac medications before your next appointment, please call your pharmacy*   Lab Work: None   Testing/Procedures: None   Follow-Up: At [facility], you and your health needs are our priority.  As part of our continuing mission to provide you with exceptional heart care, we have created designated Provider Care Teams.  These Care Teams include your primary Cardiologist (physician) and Advanced Practice Providers (APPs -  Physician Assistants and Nurse Practitioners) who all work together to provide you with the care you need, when you need it.  We recommend signing up for the patient portal called "MyChart".  Sign up information is provided on this After Visit Summary.  MyChart is used to connect with patients for Virtual Visits (Telemedicine).  Patients are able to view lab/test results, encounter notes, upcoming appointments, etc.  Non-urgent messages can be sent to your provider as well.   To learn more about what you can do with MyChart, go to [email or web address].    Your next appointment:   6 month(s)  The format for your next appointment:   In Person  Provider:   Buford Dresser, MD        Signed, Buford Dresser, MD PhD 12/27/2020   [place]

## 2020-12-27 NOTE — Patient Instructions (Signed)

## 2021-01-03 DIAGNOSIS — M26659 Arthropathy of unspecified temporomandibular joint: Secondary | ICD-10-CM | POA: Diagnosis not present

## 2021-01-11 DIAGNOSIS — B070 Plantar wart: Secondary | ICD-10-CM | POA: Diagnosis not present

## 2021-01-11 DIAGNOSIS — M25512 Pain in left shoulder: Secondary | ICD-10-CM | POA: Diagnosis not present

## 2021-01-11 DIAGNOSIS — J302 Other seasonal allergic rhinitis: Secondary | ICD-10-CM | POA: Diagnosis not present

## 2021-01-11 DIAGNOSIS — Z136 Encounter for screening for cardiovascular disorders: Secondary | ICD-10-CM | POA: Diagnosis not present

## 2021-01-11 DIAGNOSIS — Z1239 Encounter for other screening for malignant neoplasm of breast: Secondary | ICD-10-CM | POA: Diagnosis not present

## 2021-01-11 DIAGNOSIS — Z0001 Encounter for general adult medical examination with abnormal findings: Secondary | ICD-10-CM | POA: Diagnosis not present

## 2021-01-11 DIAGNOSIS — Z23 Encounter for immunization: Secondary | ICD-10-CM | POA: Diagnosis not present

## 2021-01-11 DIAGNOSIS — Z1211 Encounter for screening for malignant neoplasm of colon: Secondary | ICD-10-CM | POA: Diagnosis not present

## 2021-01-11 DIAGNOSIS — R7303 Prediabetes: Secondary | ICD-10-CM | POA: Diagnosis not present

## 2021-01-11 DIAGNOSIS — I422 Other hypertrophic cardiomyopathy: Secondary | ICD-10-CM | POA: Diagnosis not present

## 2021-01-11 DIAGNOSIS — R3129 Other microscopic hematuria: Secondary | ICD-10-CM | POA: Diagnosis not present

## 2021-01-14 ENCOUNTER — Other Ambulatory Visit: Payer: Self-pay | Admitting: Internal Medicine

## 2021-01-14 DIAGNOSIS — Z1231 Encounter for screening mammogram for malignant neoplasm of breast: Secondary | ICD-10-CM

## 2021-01-31 DIAGNOSIS — B070 Plantar wart: Secondary | ICD-10-CM | POA: Diagnosis not present

## 2021-01-31 DIAGNOSIS — G5603 Carpal tunnel syndrome, bilateral upper limbs: Secondary | ICD-10-CM | POA: Diagnosis not present

## 2021-01-31 DIAGNOSIS — R7303 Prediabetes: Secondary | ICD-10-CM | POA: Diagnosis not present

## 2021-02-19 ENCOUNTER — Other Ambulatory Visit: Payer: Self-pay | Admitting: Family Medicine

## 2021-02-26 ENCOUNTER — Ambulatory Visit (INDEPENDENT_AMBULATORY_CARE_PROVIDER_SITE_OTHER): Payer: Medicare Other

## 2021-02-26 DIAGNOSIS — I495 Sick sinus syndrome: Secondary | ICD-10-CM

## 2021-02-26 LAB — CUP PACEART REMOTE DEVICE CHECK
Battery Remaining Longevity: 145 mo
Battery Remaining Percentage: 95.5 %
Battery Voltage: 2.98 V
Brady Statistic AP VP Percent: 1 %
Brady Statistic AP VS Percent: 12 %
Brady Statistic AS VP Percent: 1 %
Brady Statistic AS VS Percent: 87 %
Brady Statistic RA Percent Paced: 11 %
Brady Statistic RV Percent Paced: 1.1 %
Date Time Interrogation Session: 20220426020014
Implantable Lead Implant Date: 20160913
Implantable Lead Implant Date: 20160913
Implantable Lead Location: 753859
Implantable Lead Location: 753860
Implantable Lead Model: 1948
Implantable Pulse Generator Implant Date: 20160913
Lead Channel Impedance Value: 390 Ohm
Lead Channel Impedance Value: 560 Ohm
Lead Channel Pacing Threshold Amplitude: 0.625 V
Lead Channel Pacing Threshold Amplitude: 0.75 V
Lead Channel Pacing Threshold Pulse Width: 0.4 ms
Lead Channel Pacing Threshold Pulse Width: 0.4 ms
Lead Channel Sensing Intrinsic Amplitude: 2.7 mV
Lead Channel Sensing Intrinsic Amplitude: 5 mV
Lead Channel Setting Pacing Amplitude: 1 V
Lead Channel Setting Pacing Amplitude: 1.625
Lead Channel Setting Pacing Pulse Width: 0.4 ms
Lead Channel Setting Sensing Sensitivity: 0.7 mV
Pulse Gen Model: 2240
Pulse Gen Serial Number: 7676319

## 2021-03-11 ENCOUNTER — Encounter (HOSPITAL_BASED_OUTPATIENT_CLINIC_OR_DEPARTMENT_OTHER): Payer: Self-pay

## 2021-03-11 ENCOUNTER — Other Ambulatory Visit: Payer: Self-pay

## 2021-03-11 ENCOUNTER — Emergency Department (HOSPITAL_BASED_OUTPATIENT_CLINIC_OR_DEPARTMENT_OTHER)
Admission: EM | Admit: 2021-03-11 | Discharge: 2021-03-11 | Disposition: A | Discharge status: Home / Self Care | Payer: Medicare Other | Attending: Emergency Medicine | Admitting: Emergency Medicine

## 2021-03-11 DIAGNOSIS — E119 Type 2 diabetes mellitus without complications: Secondary | ICD-10-CM | POA: Diagnosis not present

## 2021-03-11 DIAGNOSIS — J069 Acute upper respiratory infection, unspecified: Secondary | ICD-10-CM | POA: Insufficient documentation

## 2021-03-11 DIAGNOSIS — I110 Hypertensive heart disease with heart failure: Secondary | ICD-10-CM | POA: Diagnosis not present

## 2021-03-11 DIAGNOSIS — B9789 Other viral agents as the cause of diseases classified elsewhere: Secondary | ICD-10-CM | POA: Diagnosis not present

## 2021-03-11 DIAGNOSIS — I5030 Unspecified diastolic (congestive) heart failure: Secondary | ICD-10-CM | POA: Diagnosis not present

## 2021-03-11 DIAGNOSIS — Z859 Personal history of malignant neoplasm, unspecified: Secondary | ICD-10-CM | POA: Diagnosis not present

## 2021-03-11 DIAGNOSIS — Z20822 Contact with and (suspected) exposure to covid-19: Secondary | ICD-10-CM | POA: Diagnosis not present

## 2021-03-11 DIAGNOSIS — R059 Cough, unspecified: Secondary | ICD-10-CM | POA: Diagnosis present

## 2021-03-11 DIAGNOSIS — Z79899 Other long term (current) drug therapy: Secondary | ICD-10-CM | POA: Diagnosis not present

## 2021-03-11 LAB — RESP PANEL BY RT-PCR (FLU A&B, COVID) ARPGX2
Influenza A by PCR: NEGATIVE
Influenza B by PCR: NEGATIVE
SARS Coronavirus 2 by RT PCR: NEGATIVE

## 2021-03-11 MED ORDER — MOLNUPIRAVIR 200 MG PO CAPS: 800.0000 mg | ORAL_CAPSULE | Freq: Two times a day (BID) | ORAL | 0 refills | Status: DC

## 2021-03-11 NOTE — ED Provider Notes (Signed)
[facility] EMERGENCY DEPT Provider Note   CSN: [id number] Arrival date & time: 03/11/21  1920     History Chief Complaint  Patient presents with  . Cough  . cogestion    Sophia Wilkinson is a 55 y.o. female.  The history is provided by the patient and medical records.  Cough  Sophia Wilkinson is a 55 y.o. female who presents to the Emergency Department complaining of URI.  Started with cough, congestion on Saturday.  Just returned from a trip to [place].  She has a cough productive of clear phlegm.  She took a home covid test - one came back positive and one came back negative.  No fever, sob.  Has body aches.  Multiple family members are also sick at this time. She has a history of CHF, diabetes.  Has been vaccinated and boosted for covid 19.        Past Medical History:  Diagnosis Date  . Allergy   . Anemia   . Blood transfusion without reported diagnosis   . Bradycardia   . Cancer ([place])   . Cardiomyopathy ([place])   . CHF (congestive heart failure) ([place])   . Chronic pain   . Diabetes mellitus without complication ([place])   . GERD (gastroesophageal reflux disease)   . Heart murmur   . Migraines   . Sleep apnea     Patient Active Problem List   Diagnosis Date Noted  . Spasm of thoracic back muscle 06/10/2020  . Right shoulder pain 01/13/2020  . Left knee pain 01/13/2020  . Numbness and tingling of upper and lower extremities of both sides 10/15/2019  . Essential hypertension 06/28/2019  . Sleeping difficulties 05/24/2019  . Hemorrhoids 12/24/2018  . Sciatica 12/24/2018  . Prediabetes 07/28/2016  . Pacemaker 06/20/2016  . Depression 04/22/2016  . H/O: hysterectomy 10/16/2015  . 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 Chronic left shoulder pain 08/10/2014  . Diastolic CHF ([place]) 43/15/4008  . OSA on CPAP 06/10/2011  . Thrombocytosis 03/14/2011  . Migraine without aura 09/20/2010  . History of lead poisoning 09/20/2010  . Class 3 severe  obesity with body mass index (BMI) of 45.0 to 49.9 in adult ([place]) 02/07/2008  . Mitral valve prolapse 03/24/2006    Past Surgical History:  Procedure Laterality Date  . ABDOMINAL HYSTERECTOMY  2016  . HERNIA REPAIR    . LAPAROSCOPY ABDOMEN DIAGNOSTIC    . LEEP    . PACEMAKER IMPLANT  2016  . TUBAL LIGATION  1999     OB History   No obstetric history on file.     Family History  Problem Relation Age of Onset  . Alcohol abuse Mother   . Liver disease Mother   . Alcohol abuse Father   . Drug abuse Father   . Alcohol abuse Sister   . Heart disease Maternal Grandmother   . Diabetes Maternal Grandmother   . Hyperlipidemia Maternal Grandmother   . Heart disease Paternal Grandmother   . Diabetes Sister   . Hypertension Daughter   . Diabetes Daughter   . Colon cancer Neg Hx   . Esophageal cancer Neg Hx   . Rectal cancer Neg Hx   . Stomach cancer Neg Hx     Social History   Tobacco Use  . Smoking status: Never Smoker  . Smokeless tobacco: Never Used  Vaping Use  . Vaping Use: Never used  Substance Use Topics  . Alcohol use: Never  . Drug use: Never  Home Medications Prior to Admission medications   Medication Sig Start Date End Date Taking? Authorizing Provider  acetaminophen (TYLENOL) 500 MG tablet Take by mouth. 11/03/98   [provider]  amitriptyline (ELAVIL) 25 MG tablet Take 1 tablet (25 mg total) by mouth at bedtime. 05/13/19   Myles Gip, DO  B Complex-C-E-Zn (ZINC-VITES PO) Take 3 each by mouth daily.    [provider]  baclofen (LIORESAL) 10 MG tablet Take 1 tablet (10 mg total) by mouth 3 (three) times daily. 06/06/20   Gladys Damme, MD  candesartan (ATACAND) 4 MG tablet Take 1/2 (one-half) tablet by mouth once daily 04/16/20   Myles Gip, DO  ELDERBERRY PO Take 2 each by mouth daily.    [provider]  EQ ALLERGY RELIEF 10 MG tablet Take 1 tablet by mouth once daily 08/08/20   Gladys Damme, MD  metoprolol  succinate (TOPROL-XL) 25 MG 24 hr tablet Take 3 tablets (75 mg total) by mouth daily. 07/17/20   Buford Dresser, MD  Multiple Vitamin (MULTIVITAMIN WITH MINERALS) TABS tablet Take 1 tablet by mouth daily.    [provider]  potassium chloride (KLOR-CON) 10 MEQ tablet TAKE 1 TO 2 TABLETS BY MOUTH ONCE DAILY AS DIRECTED 07/11/20   Gladys Damme, MD  Probiotic Product (PROBIOTIC-10 ULTIMATE PO) Take 1 each by mouth daily.    [provider]  spironolactone (ALDACTONE) 25 MG tablet Take 1/2 (one-half) tablet by mouth once daily 08/08/20   Gladys Damme, MD  torsemide (DEMADEX) 10 MG tablet Take 1-2 tablets (10-20 mg total) by mouth daily. 09/13/20   Buford Dresser, MD    Allergies    Patient has no known allergies.  Review of Systems   Review of Systems  Respiratory: Positive for cough.   All other systems reviewed and are negative.   Physical Exam Updated Vital Signs BP (!) 145/76 (BP Location: Right Arm)   Pulse 65   Temp 99.4 F (37.4 C) (Oral)   Resp 18   Ht 5\' 5"  (1.651 m)   Wt 124.7 kg   SpO2 100%   BMI 45.76 kg/m   Physical Exam Vitals and nursing note reviewed.  Constitutional:      Appearance: She is well-developed.  HENT:     Head: Normocephalic and atraumatic.  Cardiovascular:     Rate and Rhythm: Normal rate and regular rhythm.     Heart sounds: No murmur heard.   Pulmonary:     Effort: Pulmonary effort is normal. No respiratory distress.     Breath sounds: Normal breath sounds.  Abdominal:     Palpations: Abdomen is soft.     Tenderness: There is no abdominal tenderness. There is no guarding or rebound.  Musculoskeletal:        General: No tenderness.     Comments: Trace pitting edema to BLE  Skin:    General: Skin is warm and dry.  Neurological:     Mental Status: She is alert and oriented to person, place, and time.  Psychiatric:        Behavior: Behavior normal.     ED Results / Procedures / Treatments    Labs (all labs ordered are listed, but only abnormal results are displayed) Labs Reviewed - No data to display  EKG None  Radiology No results found.  Procedures Procedures   Medications Ordered in ED Medications - No data to display  ED Course  I have reviewed the triage vital signs and the  nursing notes.  Pertinent labs & imaging results that were available during my care of the patient were reviewed by me and considered in my medical decision making (see chart for details).    MDM Rules/Calculators/A&P                         patient with history of CHF, diabetes here for evaluation of cough, congestion. She did have a positive as well as a negative COVID 19 test today at home and wanted to have clarification of whether or not she is truly positive with a PCR test. She is non-toxic appearing on evaluation with no respiratory distress. Clinical picture is not consistent with acute CHF, PE, pneumonia. Discussed with patient home care for URI, possible COVID-19. Given her comorbidities as well as medications will prescribe molnipuvir in the event she tested positive as recent renal function is not known.   Final Clinical Impression(s) / ED Diagnoses Final diagnoses:  Viral URI with cough  Suspected COVID-19 virus infection    Rx / DC Orders ED Discharge Orders    None       Quintella Reichert, MD 03/11/21 2233

## 2021-03-11 NOTE — ED Triage Notes (Signed)
Pt reports congestion and cough x today - states they went to [place] last week - vaccinated to covid -  Denies CP / SOB

## 2021-03-11 NOTE — ED Notes (Signed)
Cough started on Saturday, reports some nasal congestion as well.  Pt denies any pain, reports SOB with exertion at baseline, got a postive and negative COVID test at home and wants another one to confirm.

## 2021-03-19 NOTE — Progress Notes (Signed)
Remote pacemaker transmission.   

## 2021-04-04 ENCOUNTER — Other Ambulatory Visit: Payer: Self-pay | Admitting: Internal Medicine

## 2021-04-04 ENCOUNTER — Ambulatory Visit
Admission: RE | Admit: 2021-04-04 | Discharge: 2021-04-04 | Disposition: A | Discharge status: Home / Self Care | Payer: Medicare Other | Source: Ambulatory Visit | Attending: Internal Medicine | Admitting: Internal Medicine

## 2021-04-04 ENCOUNTER — Other Ambulatory Visit: Payer: Self-pay

## 2021-04-04 DIAGNOSIS — J209 Acute bronchitis, unspecified: Secondary | ICD-10-CM

## 2021-04-04 DIAGNOSIS — R202 Paresthesia of skin: Secondary | ICD-10-CM | POA: Diagnosis not present

## 2021-04-04 DIAGNOSIS — R051 Acute cough: Secondary | ICD-10-CM | POA: Diagnosis not present

## 2021-04-04 DIAGNOSIS — J302 Other seasonal allergic rhinitis: Secondary | ICD-10-CM | POA: Diagnosis not present

## 2021-04-04 DIAGNOSIS — R918 Other nonspecific abnormal finding of lung field: Secondary | ICD-10-CM | POA: Diagnosis not present

## 2021-04-04 DIAGNOSIS — R062 Wheezing: Secondary | ICD-10-CM | POA: Diagnosis not present

## 2021-04-04 DIAGNOSIS — I10 Essential (primary) hypertension: Secondary | ICD-10-CM | POA: Diagnosis not present

## 2021-04-04 DIAGNOSIS — Z20822 Contact with and (suspected) exposure to covid-19: Secondary | ICD-10-CM | POA: Diagnosis not present

## 2021-04-24 DIAGNOSIS — R051 Acute cough: Secondary | ICD-10-CM | POA: Diagnosis not present

## 2021-04-24 DIAGNOSIS — T7849XD Other allergy, subsequent encounter: Secondary | ICD-10-CM | POA: Diagnosis not present

## 2021-04-24 DIAGNOSIS — J45991 Cough variant asthma: Secondary | ICD-10-CM | POA: Diagnosis not present

## 2021-04-26 DIAGNOSIS — R7303 Prediabetes: Secondary | ICD-10-CM | POA: Diagnosis not present

## 2021-04-26 DIAGNOSIS — I422 Other hypertrophic cardiomyopathy: Secondary | ICD-10-CM | POA: Diagnosis not present

## 2021-04-26 DIAGNOSIS — I509 Heart failure, unspecified: Secondary | ICD-10-CM | POA: Diagnosis not present

## 2021-05-01 ENCOUNTER — Encounter: Payer: Self-pay | Admitting: Registered"

## 2021-05-01 ENCOUNTER — Other Ambulatory Visit: Payer: Self-pay

## 2021-05-01 ENCOUNTER — Encounter: Payer: Medicare Other | Attending: Internal Medicine | Admitting: Registered"

## 2021-05-01 DIAGNOSIS — E119 Type 2 diabetes mellitus without complications: Secondary | ICD-10-CM

## 2021-05-01 NOTE — Progress Notes (Signed)
Diabetes Self-Management Education  Visit Type: First/Initial  Appt. Start Time: 1400 Appt. End Time: 1510  05/01/2021  Sophia Wilkinson, identified by name and date of birth, is a 55 y.o. female with a diagnosis of Diabetes: Type 2.   ASSESSMENT  There were no vitals taken for this visit. There is no height or weight on file to calculate BMI.  A1c 6.7% Medications: Jardiance  Pt states she is motivated to get diabetes under control and would like to take less medication. Pt states when she was living in [place] her MD prescribed metformin for pre-diabetes. Pt states she was not able to tolerate it.  Pt states her daughter went to the [facility] and shared the 1200 calorie meal plan with patient. Patient was following this plan from March - Aug and lost 35 lbs. Patient stopped following the plan for 2 months while helping her daughter who had a stroke. Pt states she has not been able to get back on the plan and has been regaining some weight.  Pt states she has a fluid restriction (2 L) due to heart conditions including CHF. Pt is taking some medications which cause frequent urination including just starting Jardiance this week.  Sleep: pt states when she takes a sleep aid she gets about 6 hrs, but without it may not sleep at all.  Pt states she has a history of poor eating habits, including forgetting to eat when has many responsibilities with children, work and school.  Pt states she has expanded her palate to include more variety of vegetables in her diet and eats them daily.  Pt states part of her exercise routine includes walking the perimeter of [employer] before shopping.    Diabetes Self-Management Education - 05/01/21 1419       Visit Information   Visit Type First/Initial      Initial Visit   Diabetes Type Type 2    Are you currently following a meal plan? No    Are you taking your medications as prescribed? Yes      Health Coping   How  would you rate your overall health? Fair      Psychosocial Assessment   Patient Belief/Attitude about Diabetes Other (comment)   disappointed   How often do you need to have someone help you when you read instructions, pamphlets, or other written materials from your doctor or pharmacy? 1 - Never    What is the last grade level you completed in school? college graduate      Complications   Last HgB A1C per patient/outside source 6.7 %    How often do you check your blood sugar? 1-2 times/day    Fasting Blood glucose range (mg/dL) 130-179   140   Have you had a dilated eye exam in the past 12 months? Yes    Have you had a dental exam in the past 12 months? No    Are you checking your feet? No      Dietary Intake   Breakfast 2 [place] sausages, blueberry waffles, sugarfree syrup, milk    Snack (morning) none    Lunch none OR frozen dinner 320 cal 20% protein    Snack (afternoon) sometimes cookiess or sandwich    Dinner [place], fish or chicken, vegetables, sometimes starch (about a year)    Snack (evening) peanut butter and jelly sandwich OR something sweet    Beverage(s) 2-3 16 oz water, 24 oz Zero for dinner, 8 oz milk (  2 L fluid restriction)      Exercise   Exercise Type Light (walking / raking leaves)    How many days per week to you exercise? 3    How many minutes per day do you exercise? 45    Total minutes per week of exercise 135      Patient Education   Previous Diabetes Education No    Disease state  Definition of diabetes, type 1 and 2, and the diagnosis of diabetes    Nutrition management  Role of diet in the treatment of diabetes and the relationship between the three main macronutrients and blood glucose level;Food label reading, portion sizes and measuring food.;Carbohydrate counting    Physical activity and exercise  Role of exercise on diabetes management, blood pressure control and cardiac health.    Medications Reviewed patients medication for diabetes, action,  purpose, timing of dose and side effects.    Monitoring Identified appropriate SMBG and/or A1C goals.    Chronic complications Relationship between chronic complications and blood glucose control    Psychosocial adjustment Role of stress on diabetes      Individualized Goals (developed by patient)   Nutrition General guidelines for healthy choices and portions discussed    Physical Activity Exercise 3-5 times per week    Medications take my medication as prescribed    Monitoring  test my blood glucose as discussed      Outcomes   Expected Outcomes Demonstrated interest in learning. Expect positive outcomes    Future DMSE 2 months    Program Status Completed             Individualized Plan for Diabetes Self-Management Training:   Learning Objective:  Patient will have a greater understanding of diabetes self-management. Patient education plan is to attend individual and/or group sessions per assessed needs and concerns.   Patient Instructions  Goals: Have 1 waffle instead of 2 at breakfast When meal planning continue have protein and vegetables and aim for 30 grams or less per meal. Exercise more, and do some in the evening  Expected Outcomes:  Demonstrated interest in learning. Expect positive outcomes  Education material provided: ADA - How to Thrive: A Guide for Your Journey with Diabetes and Carbohydrate counting sheet  If problems or questions, patient to contact team via:  Phone and MyChart  Future DSME appointment: 2 months

## 2021-05-01 NOTE — Patient Instructions (Addendum)
Goals: Have 1 waffle instead of 2 at breakfast When meal planning continue have protein and vegetables and aim for 30 grams or less per meal. Exercise more, and do some in the evening

## 2021-05-13 ENCOUNTER — Other Ambulatory Visit: Payer: Self-pay

## 2021-05-13 ENCOUNTER — Ambulatory Visit
Admission: RE | Admit: 2021-05-13 | Discharge: 2021-05-13 | Disposition: A | Discharge status: Home / Self Care | Payer: Medicare Other | Source: Ambulatory Visit | Attending: Internal Medicine | Admitting: Internal Medicine

## 2021-05-13 DIAGNOSIS — Z1231 Encounter for screening mammogram for malignant neoplasm of breast: Secondary | ICD-10-CM | POA: Diagnosis not present

## 2021-05-15 DIAGNOSIS — J45991 Cough variant asthma: Secondary | ICD-10-CM | POA: Diagnosis not present

## 2021-05-15 DIAGNOSIS — R519 Headache, unspecified: Secondary | ICD-10-CM | POA: Diagnosis not present

## 2021-05-15 DIAGNOSIS — E1165 Type 2 diabetes mellitus with hyperglycemia: Secondary | ICD-10-CM | POA: Diagnosis not present

## 2021-05-15 DIAGNOSIS — E785 Hyperlipidemia, unspecified: Secondary | ICD-10-CM | POA: Diagnosis not present

## 2021-05-15 DIAGNOSIS — T7849XD Other allergy, subsequent encounter: Secondary | ICD-10-CM | POA: Diagnosis not present

## 2021-05-15 DIAGNOSIS — R5383 Other fatigue: Secondary | ICD-10-CM | POA: Diagnosis not present

## 2021-05-27 DIAGNOSIS — Z23 Encounter for immunization: Secondary | ICD-10-CM | POA: Diagnosis not present

## 2021-05-28 ENCOUNTER — Ambulatory Visit (INDEPENDENT_AMBULATORY_CARE_PROVIDER_SITE_OTHER): Payer: Medicare Other

## 2021-05-28 DIAGNOSIS — I495 Sick sinus syndrome: Secondary | ICD-10-CM

## 2021-05-29 LAB — CUP PACEART REMOTE DEVICE CHECK
Battery Remaining Longevity: 66 mo
Battery Remaining Percentage: 46 %
Battery Voltage: 2.98 V
Brady Statistic AP VP Percent: 1 %
Brady Statistic AP VS Percent: 9.6 %
Brady Statistic AS VP Percent: 1 %
Brady Statistic AS VS Percent: 89 %
Brady Statistic RA Percent Paced: 9.2 %
Brady Statistic RV Percent Paced: 1 %
Date Time Interrogation Session: 20220727101144
Implantable Lead Implant Date: 20160913
Implantable Lead Implant Date: 20160913
Implantable Lead Location: 753859
Implantable Lead Location: 753860
Implantable Lead Model: 1948
Implantable Pulse Generator Implant Date: 20160913
Lead Channel Impedance Value: 430 Ohm
Lead Channel Impedance Value: 560 Ohm
Lead Channel Pacing Threshold Amplitude: 0.625 V
Lead Channel Pacing Threshold Amplitude: 0.875 V
Lead Channel Pacing Threshold Pulse Width: 0.4 ms
Lead Channel Pacing Threshold Pulse Width: 0.4 ms
Lead Channel Sensing Intrinsic Amplitude: 2.4 mV
Lead Channel Sensing Intrinsic Amplitude: 5.5 mV
Lead Channel Setting Pacing Amplitude: 1.125
Lead Channel Setting Pacing Amplitude: 1.625
Lead Channel Setting Pacing Pulse Width: 0.4 ms
Lead Channel Setting Sensing Sensitivity: 0.7 mV
Pulse Gen Model: 2240
Pulse Gen Serial Number: 7676319

## 2021-06-05 DIAGNOSIS — H6691 Otitis media, unspecified, right ear: Secondary | ICD-10-CM | POA: Diagnosis not present

## 2021-06-05 DIAGNOSIS — J302 Other seasonal allergic rhinitis: Secondary | ICD-10-CM | POA: Diagnosis not present

## 2021-06-05 DIAGNOSIS — E1165 Type 2 diabetes mellitus with hyperglycemia: Secondary | ICD-10-CM | POA: Diagnosis not present

## 2021-06-05 DIAGNOSIS — K130 Diseases of lips: Secondary | ICD-10-CM | POA: Diagnosis not present

## 2021-06-24 NOTE — Progress Notes (Signed)
Remote pacemaker transmission.   

## 2021-06-25 ENCOUNTER — Other Ambulatory Visit: Payer: Self-pay

## 2021-06-25 ENCOUNTER — Ambulatory Visit (INDEPENDENT_AMBULATORY_CARE_PROVIDER_SITE_OTHER): Payer: Medicare Other | Admitting: Cardiology

## 2021-06-25 ENCOUNTER — Encounter (HOSPITAL_BASED_OUTPATIENT_CLINIC_OR_DEPARTMENT_OTHER): Payer: Self-pay | Admitting: Cardiology

## 2021-06-25 VITALS — BP 118/82 | HR 75 | Ht 66.0 in | Wt 268.0 lb

## 2021-06-25 DIAGNOSIS — I10 Essential (primary) hypertension: Secondary | ICD-10-CM

## 2021-06-25 DIAGNOSIS — Z7189 Other specified counseling: Secondary | ICD-10-CM

## 2021-06-25 DIAGNOSIS — I422 Other hypertrophic cardiomyopathy: Secondary | ICD-10-CM | POA: Diagnosis not present

## 2021-06-25 DIAGNOSIS — I5032 Chronic diastolic (congestive) heart failure: Secondary | ICD-10-CM | POA: Diagnosis not present

## 2021-06-25 DIAGNOSIS — E119 Type 2 diabetes mellitus without complications: Secondary | ICD-10-CM | POA: Diagnosis not present

## 2021-06-25 MED ORDER — TORSEMIDE 10 MG PO TABS: 10.0000 mg | ORAL_TABLET | Freq: Every day | ORAL | 3 refills | Status: AC | PRN

## 2021-06-25 NOTE — Progress Notes (Signed)
Cardiology Office Note:    Date:  06/25/2021   ID:  Sophia Wilkinson, DOB 05-02-1966, MRN [id number]  PCP:  Audley Hose, MD  Cardiologist:  Buford Dresser, MD PhD Pacemaker: Dr. Sallyanne Kuster  Referring MD: Audley Hose, MD   CC: follow up  History of Present Illness:    Sophia Wilkinson is a 55 y.o. female with a hx of hypertrophic cardiomyopathy, type II diabetes, hypertension, hyperlipidemia who is seen in follow-up today. She was initially seen as a new consult at the request of Dr. Ky Barban for evaluation and management of heart failure on 06/18/18.   Cardiac history: She has a past medical history of hypertrophic cardiomyopathy without significant obstruction, chronic diastolic heart failure, morbid obesity, obstructive sleep apnea on CPAP, and bradycardia s/p PPM. She had been followed by [facility] until moving to [facility] 11/2017. She denies family history of sudden cardiac death. No syncope. No known history of NSVT/VT. Last [facility] note summarized in initial consult note.  Today: She believes she was infected with COVID in 03/2021 during a family trip to [place]. A home test was positive, but she was negative in the ED. She was mostly asymptomatic but she has difficulty remembering the following 2 months.   Otherwise, she is feeling okay overall. Regarding her shortness of breath she states that "some days are better than others."   Every once in a while she experiences sharp chest pains in her central chest. These are self limited and not exertional. She is concerned about her implanted pacemaker, because she is feeling similar symptoms to prior to having the pacemaker. We reviewed her most recent pacemaker report, normal function.  Recently she was formally dx with diabetes, and was started on jardiance. She takes 15 mg of torsemide daily. Lately she has not noticed any edema.  She follows up with her PCP on 07/19/2021.  She denies any palpitations,  lightheadedness, headaches, syncope, orthopnea, or PND.   Past Medical History:  Diagnosis Date   Allergy    Anemia    Blood transfusion without reported diagnosis    Bradycardia    Cancer ([place])    Cardiomyopathy ([place])    CHF (congestive heart failure) ([place])    Chronic pain    Diabetes mellitus without complication (HCC)    GERD (gastroesophageal reflux disease)    Heart murmur    Migraines    Sleep apnea     Past Surgical History:  Procedure Laterality Date   ABDOMINAL HYSTERECTOMY  2016   HERNIA REPAIR     LAPAROSCOPY ABDOMEN DIAGNOSTIC     LEEP     PACEMAKER IMPLANT  2016   TUBAL LIGATION  1999    Current Medications: Current Outpatient Medications on File Prior to Visit  Medication Sig   acetaminophen (TYLENOL) 500 MG tablet Take by mouth.   amitriptyline (ELAVIL) 25 MG tablet Take 1 tablet (25 mg total) by mouth at bedtime.   candesartan (ATACAND) 4 MG tablet Take 1/2 (one-half) tablet by mouth once daily   ELDERBERRY PO Take 2 each by mouth daily.   empagliflozin (JARDIANCE) 10 MG TABS tablet Take 5 mg by mouth daily.   EQ ALLERGY RELIEF 10 MG tablet Take 1 tablet by mouth once daily   fluticasone (FLONASE) 50 MCG/ACT nasal spray Place into both nostrils daily.   gabapentin (NEURONTIN) 300 MG capsule Take 300 mg by mouth 3 (three) times daily.   metoprolol succinate (TOPROL-XL) 25 MG 24 hr tablet Take 3 tablets (  75 mg total) by mouth daily.   montelukast (SINGULAIR) 10 MG tablet Take 10 mg by mouth at bedtime.   Multiple Vitamin (MULTIVITAMIN WITH MINERALS) TABS tablet Take 1 tablet by mouth daily.   potassium chloride (KLOR-CON) 10 MEQ tablet TAKE 1 TO 2 TABLETS BY MOUTH ONCE DAILY AS DIRECTED   Probiotic Product (PROBIOTIC-10 ULTIMATE PO) Take 1 each by mouth daily.   rosuvastatin (CRESTOR) 20 MG tablet Take 20 mg by mouth daily.   spironolactone (ALDACTONE) 25 MG tablet Take 1/2 (one-half) tablet by mouth once daily   No current facility-administered  medications on file prior to visit.     Allergies:   Patient has no known allergies.   Social History   Tobacco Use   Smoking status: Never   Smokeless tobacco: Never  Vaping Use   Vaping Use: Never used  Substance Use Topics   Alcohol use: Never   Drug use: Never    Family History: The patient's family history includes Alcohol abuse in her father, mother, and sister; Diabetes in her daughter, maternal grandmother, and sister; Drug abuse in her father; Heart disease in her maternal grandmother and paternal grandmother; Hyperlipidemia in her maternal grandmother; Hypertension in her daughter; Liver disease in her mother. There is no history of Colon cancer, Esophageal cancer, Rectal cancer, or Stomach cancer.  Mother passed away age 8 from liver disease. Father passed away at age 38 from overdose. Grandparents passed in their 88s from cancer, MS, and heart disease. Brother living at age 63.   ROS:   Please see the history of present illness. (+) Shortness of breath (+) Chest pain All other systems are reviewed and negative.    EKGs/Labs/Other Studies Reviewed:    The following studies were reviewed today: Notes through [facility] in initial consult note.   Echo Stress 07/07/18 Study Conclusions  - Stress ECG conclusions: The stress ECG was normal. This score   predicts a low risk of cardiac events. - Impressions: Normal stress echo   Impressions:  - Normal study after maximal exercise. Normal stress echo  HCM treadmill stress echo 01/09/2017: CONCLUSIONS  Non-obstructive HCM.   Reduced exercise capacity for age (7.2 METs, 90% of age predicted).   Appropriate blood pressure response to exertion.   No evidence of exercise induced ischemia by echocardiography at a sub- adequate heart rate. Please note that the HCM echocardiogram is focused  on the assessment of hemodynamics (peak outflow tract gradient) at peak  stress rather than wall motion analysis. Wall motion  images are collected  later after termination of stress and thus it is likely that ischemic findings   will be missed at lower heart rates. Inducible ischemia at higher workloads cannot be ruled out.  While no wall motion abnormalities were noted on  this study, this study was performed to evaluate LV outflow obstruction  with exercise rather than being optimized to evaluate for stress induced  wall motion.    Calculated gradients assume an LA pressure of 10 mmHg. Gradients may  be underestimated if the true peak MR velocity is not captured. Direct  gradients may be overestimated if the Doppler signal is contaminated.  Treadmill Results  Protocol:           Bruce                   Duration (min:sec):06:11   METS:  Stage:                Amyl Nitrate  BP: (mmHg)   HR:  Comments:        Max HR: 131      % MPHR     77                        (mL inhaled)    Sys         Diast(bpm)    Baseline                            111     52     65                                      151     77     75  2                                   168     74     131  Peak                                               131  1 min recovery                      135     66     100  Recovery                            117     46     77  Recovery                            110     50     72   Reason for Termination:Dyspnea  Symptoms:      Complained of shortness of breath and appeared dyspneic throughout exercise.  Recovery Time (min:sec):05:33   FINDINGS   EKG FINDINGS  Baseline:  Normal sinus rhythm, nonspecific T-wave changes.   Peak:  Sinus tachycardia with occasional PVCs, no ischemic EKG changes.   METS:7.20  Hyperdynamic LV systolic function, LVEF estimated at 75-80%. All LV  walls augment appropriately.  Near LV cavity obliteration. Peak LVOT  gradient with exercise of 14 mmHg.  Chordal SAM.  Recovery:  Normal sinus rhythm with frequent PVCs and ventricular couplet in early  recovery, no ischemic EKG  changes.  Hyperdynamic LV systolic function, LVEF estimated at 70%.  No regional  wall motion abnormalities. Chordal SAM.   Echo 01/09/2017 CONCLUSIONS   Normal left ventricular cavity size. Severe asymetric left ventricular   hypertropy (septum 2.4 cm, posterior wall 1.1 cm).  Increased   (hyperdynamic) left ventricular ejection fraction. LVEF estimated at   70-75%.  No regional wall motion abnormalities.  Normal diastolic   filling pattern for age.     Normal right ventricular size. Normal right ventricular global    systolic function.    Systolic anterior motion of the mitral valve chordae. No significant   LVOT gradient at rest or with Valsalva (< 10 mmHg); post-PVC   gradient  14 mmHg.     No significant valve dysfunction.   EKG:  EKG is personally reviewed today.   06/25/2021: ECG not ordered today 12/27/2020: sinus bradycardia at 54 bpm, unchanged T wave pattern  Recent Labs: No results found for requested labs within last 8760 hours.  Recent Lipid Panel    Component Value Date/Time   CHOL 204 (H) 06/06/2020 1000   TRIG 77 06/06/2020 1000   HDL 39 (L) 06/06/2020 1000   CHOLHDL 5.2 (H) 06/06/2020 1000   LDLCALC 151 (H) 06/06/2020 1000    Physical Exam:    VS:  BP 118/82   Pulse 75   Ht '5\' 6"'$  (1.676 m)   Wt 268 lb (121.6 kg)   SpO2 97%   BMI 43.26 kg/m     Wt Readings from Last 3 Encounters:  06/25/21 268 lb (121.6 kg)  03/11/21 275 lb (124.7 kg)  12/27/20 274 lb 12.8 oz (124.6 kg)   GEN: Well nourished, well developed in no acute distress HEENT: Normal, moist mucous membranes NECK: No JVD CARDIAC: regular rhythm, normal S1 and S2, no rubs or gallops. 1/6 systolic murmur, not changed with valsalva VASCULAR: Radial and DP pulses 2+ bilaterally. No carotid bruits RESPIRATORY:  Clear to auscultation without rales, wheezing or rhonchi  ABDOMEN: Soft, non-tender, non-distended MUSCULOSKELETAL:  Ambulates independently SKIN: Warm and dry, no edema NEUROLOGIC:  Alert and  oriented x 3. No focal neuro deficits noted. PSYCHIATRIC:  Normal affect    ASSESSMENT:    1. Hypertrophic cardiomyopathy ([place])   2. Chronic diastolic congestive heart failure ([place])   3. Type 2 diabetes mellitus without complication, without long-term current use of insulin ([place])   4. Morbid obesity ([place])   5. Essential hypertension   6. Cardiac risk counseling   7. Encounter for education about heart failure     PLAN:    Hypertrophic cardiomyopathy Chronic diastolic heart failure Hypertension Type II diabetes -no red flags of syncope, family history of SCD  -murmur soft, does not change with valsalva -continue metoprolol succinate 75 mg daily -reviewed balance of fluid intake and avoiding dehydration -euvolemic today -recently started on empagliflozin for diabetes. As she has not had swelling, BP well controlled, and risk of dehydration, will change torsemide to 10 mg daily as needed. Given instructions on this today -continue candesartan, spironolactone -we discussed adding GLP1RA today, especially for diabetes and obesity. Will review at next appt or she can discuss with her PCP -heart failure education reviewed today  Morbid obesity: discussed GLP1RA, see above  Hyperlipidemia: -continue rousvastatin  intermittent atypical chest pain -No suggestion of ischemia or obstruction on prior stress test.  -counseled on red flag warning signs that need immediate medical attention -she will call if it becomes stronger, more frequent, or more bothersome without being severe  Nutrition and exercise counseling -recommend heart healthy/Mediterranean diet, with whole grains, fruits, vegetable, fish, lean meats, nuts, and olive oil. Limit salt. -recommend moderate walking, 3-5 times/week for 30-50 minutes each session. Aim for at least 150 minutes.week. Goal should be pace of 3 miles/hours, or walking 1.5 miles in 30 minutes   Not addressed in depth today: Pacemaker: followed by Dr.  Sallyanne Kuster  Plan for follow up: 6 mos or sooner PRN  Buford Dresser, MD, PhD, [facility]   Medication Adjustments/Labs and Tests Ordered: Current medicines are reviewed at length with the patient today.  Concerns regarding medicines are outlined above.   No orders of the defined types were  placed in this encounter.  Meds ordered this encounter  Medications   torsemide (DEMADEX) 10 MG tablet    Sig: Take 1 tablet (10 mg total) by mouth daily as needed (weight gain 3 lbs overnight or 5 lbs in a week).    Dispense:  90 tablet    Refill:  3    Please wait to fill until patient calls for it.    Patient Instructions  Medication Instructions:  Decrease Torsemide to 10 mg daily as needed   *If you need a refill on your cardiac medications before your next appointment, please call your pharmacy*   Lab Work: Please have primary care fax recent lab work to [phone]) [phone]   Testing/Procedures: None ordered    Follow-Up: At [facility], you and your health needs are our priority.  As part of our continuing mission to provide you with exceptional heart care, we have created designated Provider Care Teams.  These Care Teams include your primary Cardiologist (physician) and Advanced Practice Providers (APPs -  Physician Assistants and Nurse Practitioners) who all work together to provide you with the care you need, when you need it.  We recommend signing up for the patient portal called "MyChart".  Sign up information is provided on this After Visit Summary.  MyChart is used to connect with patients for Virtual Visits (Telemedicine).  Patients are able to view lab/test results, encounter notes, upcoming appointments, etc.  Non-urgent messages can be sent to your provider as well.   To learn more about what you can do with MyChart, go to [email or web address].    Your next appointment:   6 month(s)  The format for your next appointment:   In  Person  Provider:   Buford Dresser, MD    Do the following things EVERY DAY:  Weigh yourself EVERY morning after you go to the bathroom but before you eat or drink anything. Write this number down in a weight log/diary. If you gain 3 pounds overnight or 5 pounds in a week, take 10 mg of torsemide.  Take your medicines as prescribed. If you have concerns about your medications, please call us before you stop taking them.   Eat low salt foods--Limit salt (sodium) to 2000 mg per day. This will help prevent your body from holding onto fluid. Read food labels as many processed foods have a lot of sodium, especially canned goods and prepackaged meats. If you would like some assistance choosing low sodium foods, we would be happy to set you up with a nutritionist.  Stay as active as you can everyday. Staying active will give you more energy and make your muscles stronger. Start with 5 minutes at a time and work your way up to 30 minutes a day. Break up your activities--do some in the morning and some in the afternoon. Start with 3 days per week and work your way up to 5 days as you can.  If you have chest pain, feel short of breath, dizzy, or lightheaded, STOP. If you don't feel better after a short rest, call 911. If you do feel better, call the office to let us know you have symptoms with exercise.  Limit all fluids for the day to less than 2 liters. Fluid includes all drinks, coffee, juice, ice chips, soup, jello, and all other liquids.    I,Mathew Stumpf,acting as a Education administrator for [employer], MD.,have documented all relevant documentation on the behalf of Buford Dresser, MD,as directed by  Buford Dresser, MD while  in the presence of Buford Dresser, MD.  I, Buford Dresser, MD, have reviewed all documentation for this visit. The documentation on 06/25/21 for the exam, diagnosis, procedures, and orders are all accurate and complete.   Signed, Buford Dresser, MD PhD 06/25/2021   [facility]

## 2021-06-25 NOTE — Patient Instructions (Addendum)
Medication Instructions:  Decrease Torsemide to 10 mg daily as needed   *If you need a refill on your cardiac medications before your next appointment, please call your pharmacy*   Lab Work: Please have primary care fax recent lab work to [phone]) [phone]   Testing/Procedures: None ordered    Follow-Up: At [facility], you and your health needs are our priority.  As part of our continuing mission to provide you with exceptional heart care, we have created designated Provider Care Teams.  These Care Teams include your primary Cardiologist (physician) and Advanced Practice Providers (APPs -  Physician Assistants and Nurse Practitioners) who all work together to provide you with the care you need, when you need it.  We recommend signing up for the patient portal called "MyChart".  Sign up information is provided on this After Visit Summary.  MyChart is used to connect with patients for Virtual Visits (Telemedicine).  Patients are able to view lab/test results, encounter notes, upcoming appointments, etc.  Non-urgent messages can be sent to your provider as well.   To learn more about what you can do with MyChart, go to [email or web address].    Your next appointment:   6 month(s)  The format for your next appointment:   In Person  Provider:   Buford Dresser, MD    Do the following things EVERY DAY:  Weigh yourself EVERY morning after you go to the bathroom but before you eat or drink anything. Write this number down in a weight log/diary. If you gain 3 pounds overnight or 5 pounds in a week, take 10 mg of torsemide.  Take your medicines as prescribed. If you have concerns about your medications, please call us before you stop taking them.   Eat low salt foods--Limit salt (sodium) to 2000 mg per day. This will help prevent your body from holding onto fluid. Read food labels as many processed foods have a lot of sodium, especially canned goods and prepackaged meats. If you  would like some assistance choosing low sodium foods, we would be happy to set you up with a nutritionist.  Stay as active as you can everyday. Staying active will give you more energy and make your muscles stronger. Start with 5 minutes at a time and work your way up to 30 minutes a day. Break up your activities--do some in the morning and some in the afternoon. Start with 3 days per week and work your way up to 5 days as you can.  If you have chest pain, feel short of breath, dizzy, or lightheaded, STOP. If you don't feel better after a short rest, call 911. If you do feel better, call the office to let us know you have symptoms with exercise.  Limit all fluids for the day to less than 2 liters. Fluid includes all drinks, coffee, juice, ice chips, soup, jello, and all other liquids.

## 2021-06-28 ENCOUNTER — Encounter (HOSPITAL_BASED_OUTPATIENT_CLINIC_OR_DEPARTMENT_OTHER): Payer: Self-pay

## 2021-07-01 ENCOUNTER — Other Ambulatory Visit: Payer: Self-pay

## 2021-07-01 ENCOUNTER — Encounter: Payer: Medicare Other | Attending: Internal Medicine | Admitting: Registered"

## 2021-07-01 DIAGNOSIS — E119 Type 2 diabetes mellitus without complications: Secondary | ICD-10-CM

## 2021-07-01 DIAGNOSIS — Z794 Long term (current) use of insulin: Secondary | ICD-10-CM | POA: Diagnosis not present

## 2021-07-01 NOTE — Progress Notes (Signed)
Diabetes Self-Management Education  Visit Type: Follow-up  Appt. Start Time: 1405 Appt. End Time: [id number]  07/01/2021  Ms. Sophia Wilkinson, identified by name and date of birth, is a 55 y.o. female with a diagnosis of Diabetes: Type 2.   ASSESSMENT  There were no vitals taken for this visit. There is no height or weight on file to calculate BMI.  Patient states she has been able to reduce dose of one of her blood pressure medications as well as Jardiance.  Patient states she doesn't have much of an appetite, may only eat 1-2 times per day.  Patient states she continues to get her walking in when shopping at stores like [place], [place]. Pt states she has a treadmill but stopped using after it was bothering her knee that has reduced cartilage.  Patient states she helped her mother come off some diabetes medications by watching the carbs in her meals. Pt states she is interested in handout that has more extensive lists of carbohydrates.    Diabetes Self-Management Education - 07/01/21 1412       Visit Information   Visit Type Follow-up      Initial Visit   Diabetes Type Type 2    Are you taking your medications as prescribed? Yes      Complications   How often do you check your blood sugar? 1-2 times/day    Fasting Blood glucose range (mg/dL) 70-129;130-179   115-150     Dietary Intake   Breakfast none    Lunch spaghetti, ground [place]    Dinner baked [place] wings, 1 1/2 c brown rice, zuchinni onion    Snack (evening) birthday cake, no-sugar added ice cream    Beverage(s) water, sprite zero, lemonade no-sugar, 1 c pomegranite juce to take medicine      Exercise   Exercise Type Light (walking / raking leaves)    How many days per week to you exercise? 2      Individualized Goals (developed by patient)   Nutrition Other (comment)   count carbs if BG over 180 after meals   Physical Activity Exercise 3-5 times per week      Patient Self-Evaluation of Goals -  Patient rates self as meeting previously set goals (% of time)   Physical Activity < 25%    Medications >75%    Monitoring >75%      Outcomes   Expected Outcomes Demonstrated interest in learning. Expect positive outcomes    Future DMSE PRN    Program Status Not Completed      Subsequent Visit   Since your last visit have you continued or begun to take your medications as prescribed? Yes             Individualized Plan for Diabetes Self-Management Training:   Learning Objective:  Patient will have a greater understanding of diabetes self-management. Patient education plan is to attend individual and/or group sessions per assessed needs and concerns.   Patient Instructions  Look through the Meal Planning and Carb Counting book Exercise: add 1 day per week using treadmill for 30 minutes, continue walking 2 days per week. Aim to eat 2-3 meals per day. Keep including vegetables daily.   Expected Outcomes:  Demonstrated interest in learning. Expect positive outcomes  Education material provided: Meal planning and carb counting.  If problems or questions, patient to contact team via:  Phone and MyChart  Future DSME appointment: PRN

## 2021-07-01 NOTE — Patient Instructions (Signed)
Look through the Meal Planning and Carb Counting book Exercise: add 1 day per week using treadmill for 30 minutes, continue walking 2 days per week. Aim to eat 2-3 meals per day. Keep including vegetables daily.

## 2021-07-19 DIAGNOSIS — E1165 Type 2 diabetes mellitus with hyperglycemia: Secondary | ICD-10-CM | POA: Diagnosis not present

## 2021-07-19 DIAGNOSIS — I422 Other hypertrophic cardiomyopathy: Secondary | ICD-10-CM | POA: Diagnosis not present

## 2021-07-19 DIAGNOSIS — I509 Heart failure, unspecified: Secondary | ICD-10-CM | POA: Diagnosis not present

## 2021-07-19 DIAGNOSIS — E785 Hyperlipidemia, unspecified: Secondary | ICD-10-CM | POA: Diagnosis not present

## 2021-07-22 DIAGNOSIS — E1165 Type 2 diabetes mellitus with hyperglycemia: Secondary | ICD-10-CM | POA: Diagnosis not present

## 2021-07-22 DIAGNOSIS — E785 Hyperlipidemia, unspecified: Secondary | ICD-10-CM | POA: Diagnosis not present

## 2021-07-22 DIAGNOSIS — M5432 Sciatica, left side: Secondary | ICD-10-CM | POA: Diagnosis not present

## 2021-07-22 DIAGNOSIS — M722 Plantar fascial fibromatosis: Secondary | ICD-10-CM | POA: Diagnosis not present

## 2021-07-22 DIAGNOSIS — M79672 Pain in left foot: Secondary | ICD-10-CM | POA: Diagnosis not present

## 2021-07-29 IMAGING — CR DG CHEST 2V
2 series · 2 of 2 positions shown · non-contrast
Comparison: 09/24/2018

CLINICAL DATA: Acute bronchitis

EXAM:
CHEST - 2 VIEW

[w chest pa]
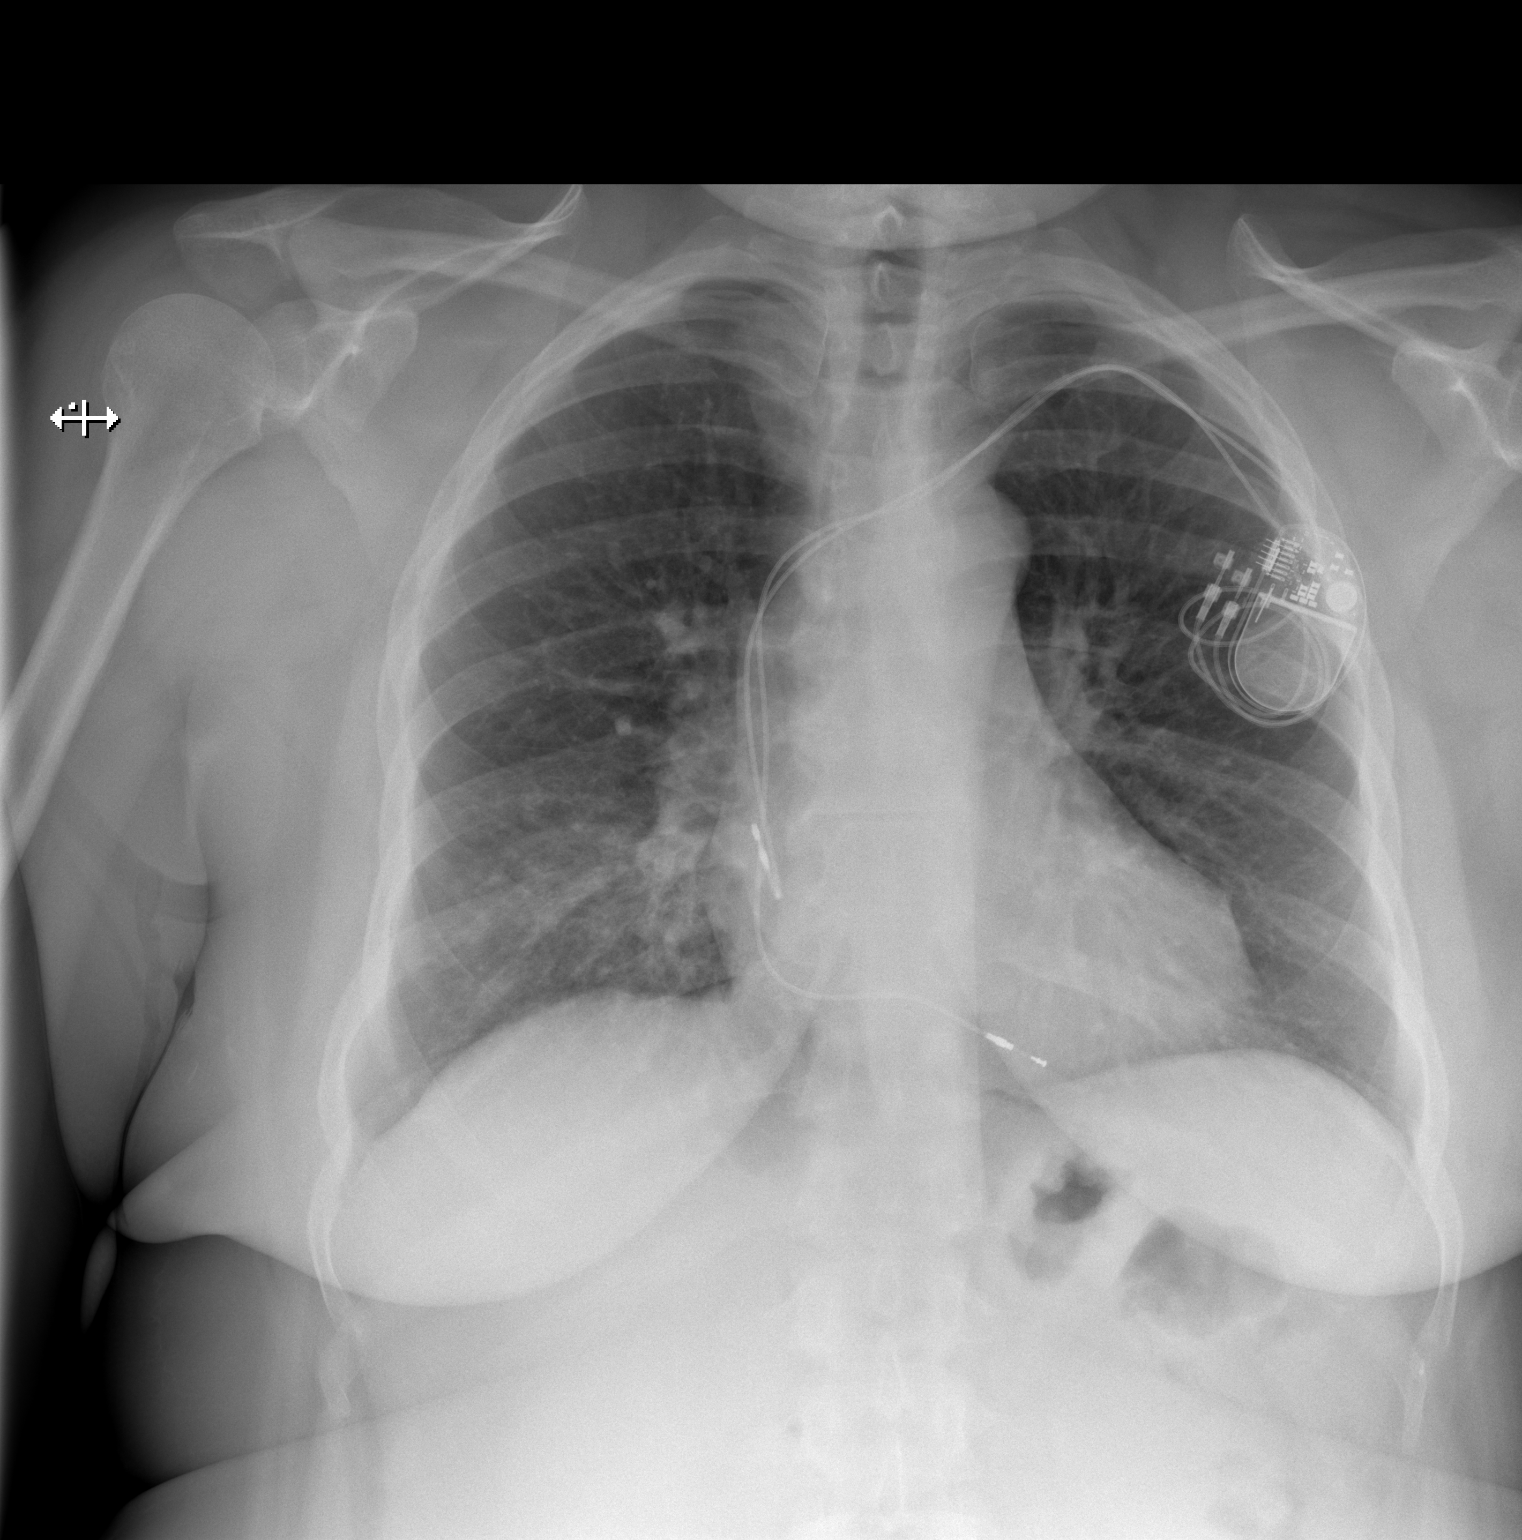

[w chest lat]
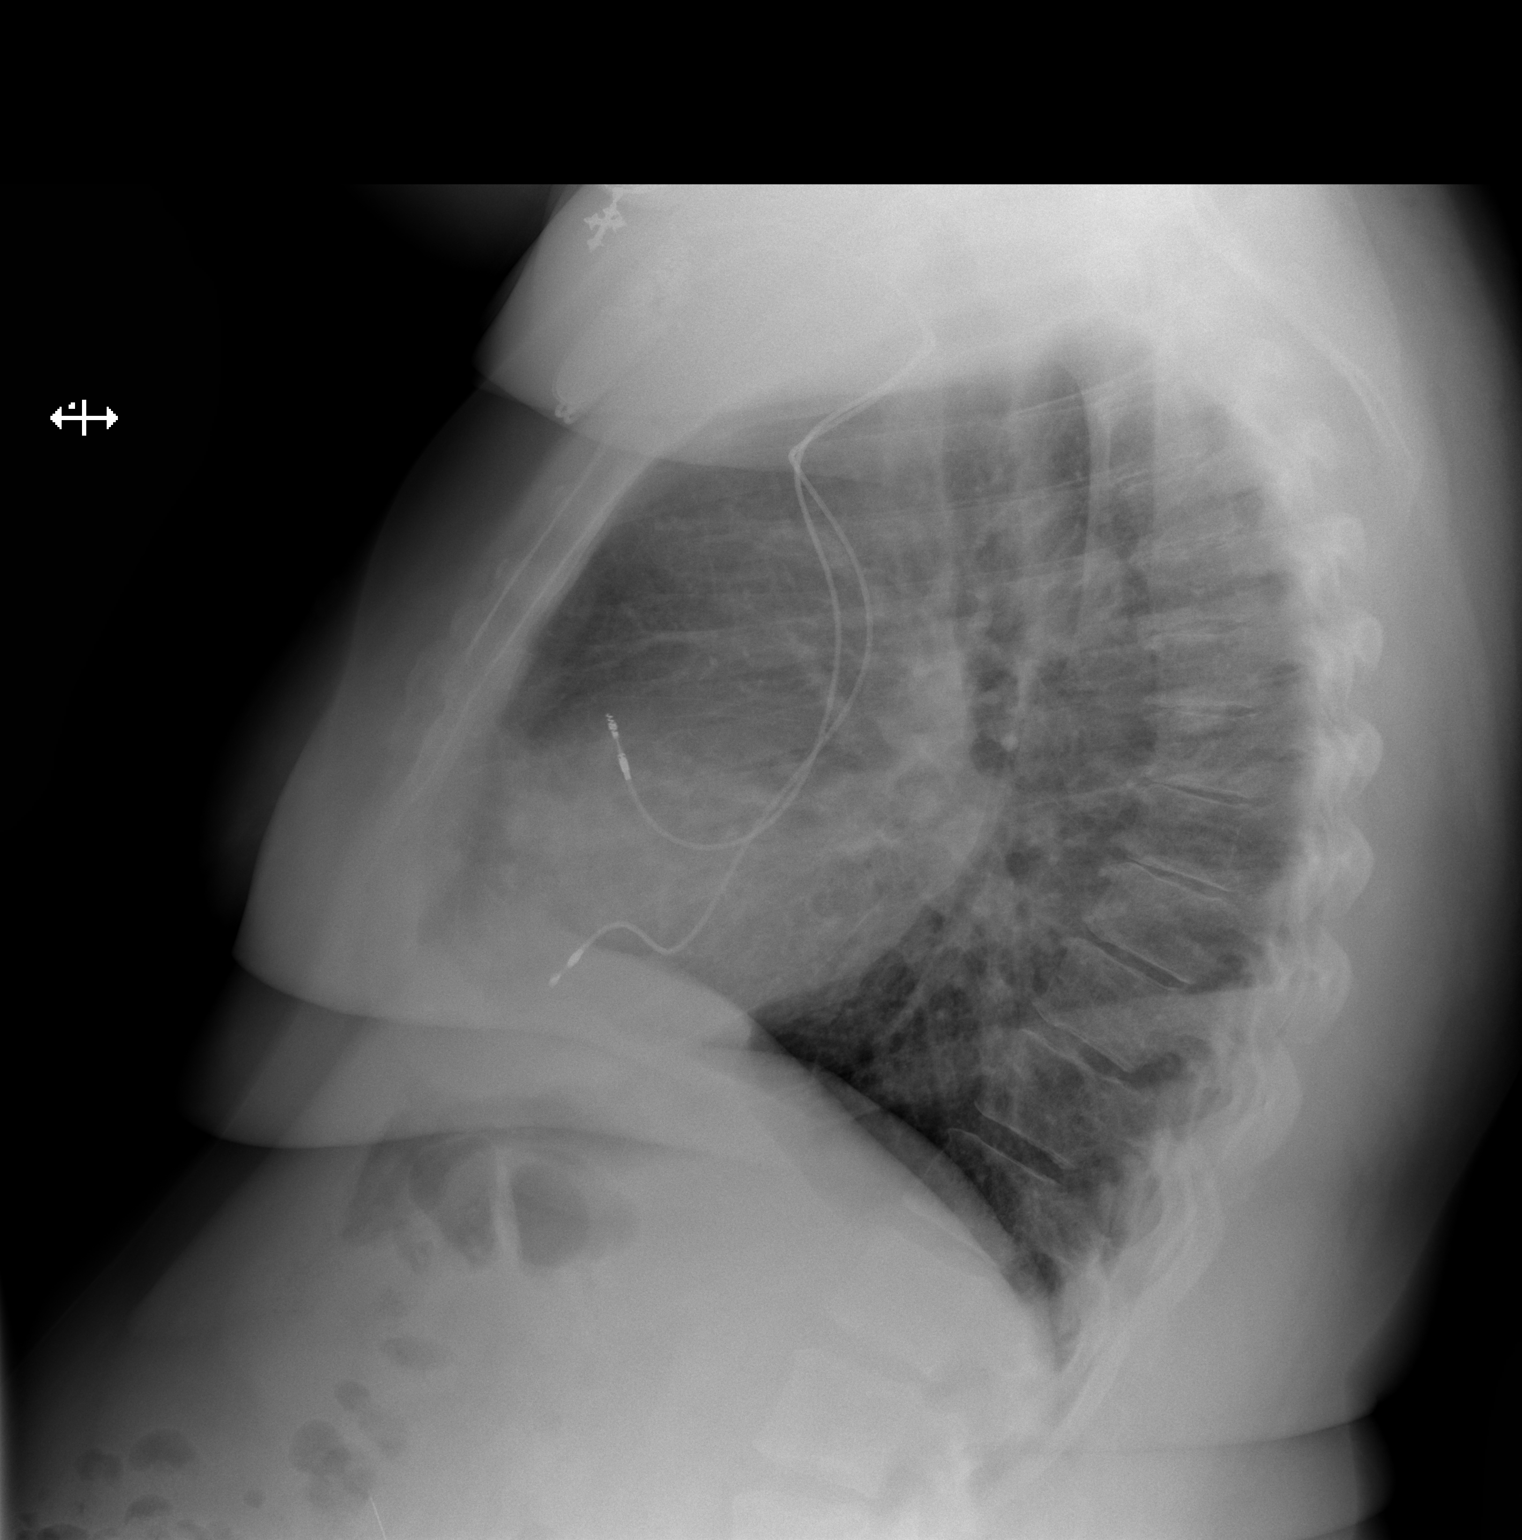

[2 of 2 positions shown; findings below may reference images not displayed]

FINDINGS: Stable positioning of a left-sided implanted cardiac device. Heart
size is within normal limits. Mild interstitial prominence with
streaky right basilar opacity. No pleural effusion or pneumothorax.
IMPRESSION: Mild interstitial prominence with streaky right basilar opacity,
which may represent atelectasis or developing infiltrate.

## 2021-08-27 ENCOUNTER — Ambulatory Visit (INDEPENDENT_AMBULATORY_CARE_PROVIDER_SITE_OTHER): Payer: Medicare Other

## 2021-08-27 DIAGNOSIS — I422 Other hypertrophic cardiomyopathy: Secondary | ICD-10-CM

## 2021-08-27 LAB — CUP PACEART REMOTE DEVICE CHECK
Battery Remaining Longevity: 63 mo
Battery Remaining Percentage: 45 %
Battery Voltage: 2.98 V
Brady Statistic AP VP Percent: 1 %
Brady Statistic AP VS Percent: 8.5 %
Brady Statistic AS VP Percent: 1 %
Brady Statistic AS VS Percent: 90 %
Brady Statistic RA Percent Paced: 8.1 %
Brady Statistic RV Percent Paced: 1 %
Date Time Interrogation Session: 20221025020014
Implantable Lead Implant Date: 20160913
Implantable Lead Implant Date: 20160913
Implantable Lead Location: 753859
Implantable Lead Location: 753860
Implantable Lead Model: 1948
Implantable Pulse Generator Implant Date: 20160913
Lead Channel Impedance Value: 380 Ohm
Lead Channel Impedance Value: 590 Ohm
Lead Channel Pacing Threshold Amplitude: 0.625 V
Lead Channel Pacing Threshold Amplitude: 1.125 V
Lead Channel Pacing Threshold Pulse Width: 0.4 ms
Lead Channel Pacing Threshold Pulse Width: 0.4 ms
Lead Channel Sensing Intrinsic Amplitude: 2.1 mV
Lead Channel Sensing Intrinsic Amplitude: 4.1 mV
Lead Channel Setting Pacing Amplitude: 1.375
Lead Channel Setting Pacing Amplitude: 1.625
Lead Channel Setting Pacing Pulse Width: 0.4 ms
Lead Channel Setting Sensing Sensitivity: 0.7 mV
Pulse Gen Model: 2240
Pulse Gen Serial Number: 7676319

## 2021-08-28 ENCOUNTER — Ambulatory Visit (INDEPENDENT_AMBULATORY_CARE_PROVIDER_SITE_OTHER): Payer: Medicare Other | Admitting: Podiatry

## 2021-08-28 ENCOUNTER — Other Ambulatory Visit: Payer: Self-pay

## 2021-08-28 DIAGNOSIS — E119 Type 2 diabetes mellitus without complications: Secondary | ICD-10-CM

## 2021-08-28 NOTE — Progress Notes (Signed)
   HPI: 55 y.o. female presenting today PMHx recently diagnosed with diabetes without complication presenting today today for evaluation of intermittent posterior heel pain.  Patient states that when she bends over stoop she does experience some sharp shooting pains to the posterior heels.  She is recently been diagnosed with diabetes and would also like a diabetic foot exam.  Past Medical History:  Diagnosis Date   Allergy    Anemia    Blood transfusion without reported diagnosis    Bradycardia    Cancer ([place])    Cardiomyopathy ([place])    CHF (congestive heart failure) (HCC)    Chronic pain    Diabetes mellitus without complication (HCC)    GERD (gastroesophageal reflux disease)    Heart murmur    Migraines    Sleep apnea      Physical Exam: General: The patient is alert and oriented x3 in no acute distress.  Dermatology: Skin is warm, dry and supple bilateral lower extremities. Negative for open lesions or macerations.  Vascular: Palpable pedal pulses bilaterally. No edema or erythema noted. Capillary refill within normal limits.  Neurological: Epicritic and protective threshold grossly intact bilaterally.   Musculoskeletal Exam: No pedal deformities noted.  There is some pain with ankle joint dorsiflexion to the posterior heel whenever the Achilles tendon is stretched.  Limited ankle joint dorsiflexion consistent with a gastrocnemius equinus  Assessment: 1.  Recently diagnosed with diabetes mellitus without complication 2.  Mild insertional Achilles tendinitis; intermittent   Plan of Care:  1. Patient evaluated.  2.  Recommend daily stretching exercises and calf stretches 3.  Comprehensive diabetic foot exam performed today.  No complications noted 4.  Return to clinic as needed      Edrick Kins, DPM [facility]  Dr. Edrick Kins, DPM    [address], [place] [address]                Office [phone]  Fax [phone]

## 2021-09-05 NOTE — Progress Notes (Signed)
Remote pacemaker transmission.   

## 2021-10-08 DIAGNOSIS — E1165 Type 2 diabetes mellitus with hyperglycemia: Secondary | ICD-10-CM | POA: Diagnosis not present

## 2021-10-08 DIAGNOSIS — J45991 Cough variant asthma: Secondary | ICD-10-CM | POA: Diagnosis not present

## 2021-10-08 DIAGNOSIS — R8290 Unspecified abnormal findings in urine: Secondary | ICD-10-CM | POA: Diagnosis not present

## 2021-10-08 DIAGNOSIS — B3731 Acute candidiasis of vulva and vagina: Secondary | ICD-10-CM | POA: Diagnosis not present

## 2021-10-08 DIAGNOSIS — R350 Frequency of micturition: Secondary | ICD-10-CM | POA: Diagnosis not present

## 2021-10-08 DIAGNOSIS — I739 Peripheral vascular disease, unspecified: Secondary | ICD-10-CM | POA: Diagnosis not present

## 2021-10-14 DIAGNOSIS — G4733 Obstructive sleep apnea (adult) (pediatric): Secondary | ICD-10-CM | POA: Diagnosis not present

## 2021-10-14 DIAGNOSIS — Z1211 Encounter for screening for malignant neoplasm of colon: Secondary | ICD-10-CM | POA: Diagnosis not present

## 2021-10-14 DIAGNOSIS — B3731 Acute candidiasis of vulva and vagina: Secondary | ICD-10-CM | POA: Diagnosis not present

## 2021-10-14 DIAGNOSIS — I509 Heart failure, unspecified: Secondary | ICD-10-CM | POA: Diagnosis not present

## 2021-10-14 DIAGNOSIS — J45991 Cough variant asthma: Secondary | ICD-10-CM | POA: Diagnosis not present

## 2021-10-14 DIAGNOSIS — L259 Unspecified contact dermatitis, unspecified cause: Secondary | ICD-10-CM | POA: Diagnosis not present

## 2021-10-14 DIAGNOSIS — J302 Other seasonal allergic rhinitis: Secondary | ICD-10-CM | POA: Diagnosis not present

## 2021-10-14 DIAGNOSIS — M1712 Unilateral primary osteoarthritis, left knee: Secondary | ICD-10-CM | POA: Diagnosis not present

## 2021-10-14 DIAGNOSIS — I422 Other hypertrophic cardiomyopathy: Secondary | ICD-10-CM | POA: Diagnosis not present

## 2021-10-14 DIAGNOSIS — E785 Hyperlipidemia, unspecified: Secondary | ICD-10-CM | POA: Diagnosis not present

## 2021-10-14 DIAGNOSIS — E1165 Type 2 diabetes mellitus with hyperglycemia: Secondary | ICD-10-CM | POA: Diagnosis not present

## 2021-11-24 DIAGNOSIS — Z1212 Encounter for screening for malignant neoplasm of rectum: Secondary | ICD-10-CM | POA: Diagnosis not present

## 2021-11-24 DIAGNOSIS — Z1211 Encounter for screening for malignant neoplasm of colon: Secondary | ICD-10-CM | POA: Diagnosis not present

## 2021-11-26 ENCOUNTER — Ambulatory Visit (INDEPENDENT_AMBULATORY_CARE_PROVIDER_SITE_OTHER): Payer: Medicare Other

## 2021-11-26 DIAGNOSIS — I422 Other hypertrophic cardiomyopathy: Secondary | ICD-10-CM | POA: Diagnosis not present

## 2021-11-26 LAB — CUP PACEART REMOTE DEVICE CHECK
Battery Remaining Longevity: 59 mo
Battery Remaining Percentage: 43 %
Battery Voltage: 2.96 V
Brady Statistic AP VP Percent: 1 %
Brady Statistic AP VS Percent: 7.8 %
Brady Statistic AS VP Percent: 1 %
Brady Statistic AS VS Percent: 91 %
Brady Statistic RA Percent Paced: 7.4 %
Brady Statistic RV Percent Paced: 1 %
Date Time Interrogation Session: 20230124020015
Implantable Lead Implant Date: 20160913
Implantable Lead Implant Date: 20160913
Implantable Lead Location: 753859
Implantable Lead Location: 753860
Implantable Lead Model: 1948
Implantable Pulse Generator Implant Date: 20160913
Lead Channel Impedance Value: 350 Ohm
Lead Channel Impedance Value: 560 Ohm
Lead Channel Pacing Threshold Amplitude: 0.625 V
Lead Channel Pacing Threshold Amplitude: 1.125 V
Lead Channel Pacing Threshold Pulse Width: 0.4 ms
Lead Channel Pacing Threshold Pulse Width: 0.4 ms
Lead Channel Sensing Intrinsic Amplitude: 2.4 mV
Lead Channel Sensing Intrinsic Amplitude: 4.7 mV
Lead Channel Setting Pacing Amplitude: 1.375
Lead Channel Setting Pacing Amplitude: 1.625
Lead Channel Setting Pacing Pulse Width: 0.4 ms
Lead Channel Setting Sensing Sensitivity: 0.7 mV
Pulse Gen Model: 2240
Pulse Gen Serial Number: 7676319

## 2021-12-01 LAB — COLOGUARD: COLOGUARD: NEGATIVE

## 2021-12-06 NOTE — Progress Notes (Signed)
Remote pacemaker transmission.   

## 2021-12-11 DIAGNOSIS — E1165 Type 2 diabetes mellitus with hyperglycemia: Secondary | ICD-10-CM | POA: Diagnosis not present

## 2021-12-11 DIAGNOSIS — E785 Hyperlipidemia, unspecified: Secondary | ICD-10-CM | POA: Diagnosis not present

## 2021-12-11 DIAGNOSIS — M5416 Radiculopathy, lumbar region: Secondary | ICD-10-CM | POA: Diagnosis not present

## 2021-12-26 NOTE — Progress Notes (Incomplete)
Cardiology Office Note:    Date:  12/27/2021   ID:  Sophia Wilkinson, DOB 1966/08/01, MRN [id number]  PCP:  Audley Hose, MD  Cardiologist:  Buford Dresser, MD PhD Pacemaker: Dr. Sallyanne Kuster  Referring MD: Audley Hose, MD   CC: follow up  History of Present Illness:    Sophia Wilkinson is a 56 y.o. female with a hx of hypertrophic cardiomyopathy, type II diabetes, hypertension, hyperlipidemia who is seen in follow-up today. She was initially seen as a new consult at the request of Dr. Ky Barban for evaluation and management of heart failure on 06/18/18.   Cardiac history: She has a past medical history of hypertrophic cardiomyopathy without significant obstruction, chronic diastolic heart failure, morbid obesity, obstructive sleep apnea on CPAP, and bradycardia s/p PPM. She had been followed by [facility] until moving to [facility] 11/2017. She denies family history of sudden cardiac death. No syncope. No known history of NSVT/VT. Last [facility] note summarized in initial consult note.  Today: Overall, she is feeling okay. Occasionally she suffers from muscle cramps in her chest, usually localized in her left chest. It seems to worsen with movement and will occur randomly.  She notes not taking her antihypertensives yet this morning. Her BP is well controlled in clinic today at 118/70.  She is also concerned that her weight seems to be increasing despite not eating much.  Her most strenuous activity lately has been walking up a flight of stairs. At the top she felt winded, but was otherwise okay.  She denies any palpitations, or peripheral edema. No lightheadedness, headaches, syncope, orthopnea, or PND.   Past Medical History:  Diagnosis Date   Allergy    Anemia    Blood transfusion without reported diagnosis    Bradycardia    Cancer ([place])    Cardiomyopathy ([place])    CHF (congestive heart failure) ([place])    Chronic pain    Diabetes mellitus without complication (HCC)     GERD (gastroesophageal reflux disease)    Heart murmur    Migraines    Sleep apnea     Past Surgical History:  Procedure Laterality Date   ABDOMINAL HYSTERECTOMY  2016   HERNIA REPAIR     LAPAROSCOPY ABDOMEN DIAGNOSTIC     LEEP     PACEMAKER IMPLANT  2016   TUBAL LIGATION  1999    Current Medications: Current Outpatient Medications on File Prior to Visit  Medication Sig   acetaminophen (TYLENOL) 500 MG tablet Take by mouth.   amitriptyline (ELAVIL) 25 MG tablet Take 1 tablet (25 mg total) by mouth at bedtime.   candesartan (ATACAND) 4 MG tablet Take 1/2 (one-half) tablet by mouth once daily   ELDERBERRY PO Take 2 each by mouth daily.   empagliflozin (JARDIANCE) 10 MG TABS tablet Take 5 mg by mouth daily.   EQ ALLERGY RELIEF 10 MG tablet Take 1 tablet by mouth once daily   fluticasone (FLONASE) 50 MCG/ACT nasal spray Place into both nostrils daily.   gabapentin (NEURONTIN) 300 MG capsule Take 300 mg by mouth 3 (three) times daily.   metoprolol succinate (TOPROL-XL) 25 MG 24 hr tablet Take 3 tablets (75 mg total) by mouth daily.   montelukast (SINGULAIR) 10 MG tablet Take 10 mg by mouth at bedtime.   Multiple Vitamin (MULTIVITAMIN WITH MINERALS) TABS tablet Take 1 tablet by mouth daily.   potassium chloride (KLOR-CON) 10 MEQ tablet TAKE 1 TO 2 TABLETS BY MOUTH ONCE DAILY AS DIRECTED (Patient taking  differently: as needed. TAKE 1 TO 2 TABLETS BY MOUTH ONCE DAILY AS DIRECTED)   Probiotic Product (PROBIOTIC-10 ULTIMATE PO) Take 1 each by mouth daily.   rosuvastatin (CRESTOR) 20 MG tablet Take 20 mg by mouth daily.   spironolactone (ALDACTONE) 25 MG tablet Take 1/2 (one-half) tablet by mouth once daily   torsemide (DEMADEX) 10 MG tablet Take 1 tablet (10 mg total) by mouth daily as needed (weight gain 3 lbs overnight or 5 lbs in a week).   No current facility-administered medications on file prior to visit.     Allergies:   Metformin   Social History   Tobacco Use   Smoking  status: Never   Smokeless tobacco: Never  Vaping Use   Vaping Use: Never used  Substance Use Topics   Alcohol use: Never   Drug use: Never    Family History: The patient's family history includes Alcohol abuse in her father, mother, and sister; Diabetes in her daughter, maternal grandmother, and sister; Drug abuse in her father; Heart disease in her maternal grandmother and paternal grandmother; Hyperlipidemia in her maternal grandmother; Hypertension in her daughter; Liver disease in her mother. There is no history of Colon cancer, Esophageal cancer, Rectal cancer, or Stomach cancer.  Mother passed away age 56 from liver disease. Father passed away at age 7 from overdose. Grandparents passed in their 78s from cancer, MS, and heart disease. Brother living at age 87.   ROS:   Please see the history of present illness. (+) Left chest muscle cramping (+) Exertional shortness of breath All other systems are reviewed and negative.    EKGs/Labs/Other Studies Reviewed:    The following studies were reviewed today: Notes through [place] in initial consult note.   Echo Stress 07/07/18 Study Conclusions  - Stress ECG conclusions: The stress ECG was normal. This score   predicts a low risk of cardiac events. - Impressions: Normal stress echo   Impressions:  - Normal study after maximal exercise. Normal stress echo  HCM treadmill stress echo 01/09/2017: CONCLUSIONS  Non-obstructive HCM.   Reduced exercise capacity for age (7.2 METs, 90% of age predicted).   Appropriate blood pressure response to exertion.   No evidence of exercise induced ischemia by echocardiography at a sub- adequate heart rate. Please note that the HCM echocardiogram is focused  on the assessment of hemodynamics (peak outflow tract gradient) at peak  stress rather than wall motion analysis. Wall motion images are collected  later after termination of stress and thus it is likely that ischemic findings   will be  missed at lower heart rates. Inducible ischemia at higher workloads cannot be ruled out.  While no wall motion abnormalities were noted on  this study, this study was performed to evaluate LV outflow obstruction  with exercise rather than being optimized to evaluate for stress induced  wall motion.    Calculated gradients assume an LA pressure of 10 mmHg. Gradients may  be underestimated if the true peak MR velocity is not captured. Direct  gradients may be overestimated if the Doppler signal is contaminated.  Treadmill Results  Protocol:           Bruce                   Duration (min:sec):06:11   METS:  Stage:                Amyl Nitrate      BP: (mmHg)   HR:  Comments:  Max HR: 131      % MPHR     77                        (mL inhaled)    Sys         Diast(bpm)    Baseline                            111     52     65                                      151     77     75  2                                   168     74     131  Peak                                               131  1 min recovery                      135     66     100  Recovery                            117     46     77  Recovery                            110     50     72   Reason for Termination:Dyspnea  Symptoms:      Complained of shortness of breath and appeared dyspneic throughout exercise.  Recovery Time (min:sec):05:33   FINDINGS   EKG FINDINGS  Baseline:  Normal sinus rhythm, nonspecific T-wave changes.   Peak:  Sinus tachycardia with occasional PVCs, no ischemic EKG changes.   METS:7.20  Hyperdynamic LV systolic function, LVEF estimated at 75-80%. All LV  walls augment appropriately.  Near LV cavity obliteration. Peak LVOT  gradient with exercise of 14 mmHg.  Chordal SAM.  Recovery:  Normal sinus rhythm with frequent PVCs and ventricular couplet in early  recovery, no ischemic EKG changes.  Hyperdynamic LV systolic function, LVEF estimated at 70%.  No regional  wall motion abnormalities.  Chordal SAM.   Echo 01/09/2017 CONCLUSIONS   Normal left ventricular cavity size. Severe asymetric left ventricular   hypertropy (septum 2.4 cm, posterior wall 1.1 cm).  Increased   (hyperdynamic) left ventricular ejection fraction. LVEF estimated at   70-75%.  No regional wall motion abnormalities.  Normal diastolic   filling pattern for age.     Normal right ventricular size. Normal right ventricular global    systolic function.    Systolic anterior motion of the mitral valve chordae. No significant   LVOT gradient at rest or with Valsalva (< 10 mmHg); post-PVC   gradient 14 mmHg.     No significant valve dysfunction.   EKG:  EKG is personally reviewed today.   12/27/2021: *** 06/25/2021: ECG not ordered 12/27/2020: sinus bradycardia at 54 bpm, unchanged T wave pattern  Recent Labs: No results found for requested labs within last 8760 hours.   Recent Lipid Panel    Component Value Date/Time   CHOL 204 (H) 06/06/2020 1000   TRIG 77 06/06/2020 1000   HDL 39 (L) 06/06/2020 1000   CHOLHDL 5.2 (H) 06/06/2020 1000   LDLCALC 151 (H) 06/06/2020 1000    Physical Exam:    VS:  BP 118/70    Pulse 64    Ht 5\' 6"  (1.676 m)    Wt 279 lb 3.2 oz (126.6 kg)    SpO2 99%    BMI 45.06 kg/m     Wt Readings from Last 3 Encounters:  12/27/21 279 lb 3.2 oz (126.6 kg)  06/25/21 268 lb (121.6 kg)  03/11/21 275 lb (124.7 kg)   GEN: Well nourished, well developed in no acute distress HEENT: Normal, moist mucous membranes NECK: No JVD CARDIAC: regular rhythm, normal S1 and S2, no rubs or gallops. ***1/6 systolic murmur, not changed with valsalva VASCULAR: Radial and DP pulses 2+ bilaterally. No carotid bruits RESPIRATORY:  Clear to auscultation without rales, wheezing or rhonchi  ABDOMEN: Soft, non-tender, non-distended MUSCULOSKELETAL:  Ambulates independently SKIN: Warm and dry, no edema NEUROLOGIC:  Alert and oriented x 3. No focal neuro deficits noted. PSYCHIATRIC:  Normal affect     ASSESSMENT:    No diagnosis found.   PLAN:    Hypertrophic cardiomyopathy Chronic diastolic heart failure Hypertension Type II diabetes -no red flags of syncope, family history of SCD  -murmur soft, does not change with valsalva -continue metoprolol succinate 75 mg daily -reviewed balance of fluid intake and avoiding dehydration -euvolemic today -recently started on empagliflozin for diabetes. As she has not had swelling, BP well controlled, and risk of dehydration, will change torsemide to 10 mg daily as needed. Given instructions on this today -continue candesartan, spironolactone -we discussed adding GLP1RA today, especially for diabetes and obesity. Will review at next appt or she can discuss with her PCP -heart failure education reviewed today  Morbid obesity: discussed GLP1RA, see above  Hyperlipidemia: -continue rousvastatin  intermittent atypical chest pain -No suggestion of ischemia or obstruction on prior stress test.  -counseled on red flag warning signs that need immediate medical attention -she will call if it becomes stronger, more frequent, or more bothersome without being severe  Nutrition and exercise counseling -recommend heart healthy/Mediterranean diet, with whole grains, fruits, vegetable, fish, lean meats, nuts, and olive oil. Limit salt. -recommend moderate walking, 3-5 times/week for 30-50 minutes each session. Aim for at least 150 minutes.week. Goal should be pace of 3 miles/hours, or walking 1.5 miles in 30 minutes   Not addressed in depth today: Pacemaker: followed by Dr. Sallyanne Kuster  Plan for follow up: 1 year or sooner PRN  Buford Dresser, MD, PhD, [facility]   Medication Adjustments/Labs and Tests Ordered: Current medicines are reviewed at length with the patient today.  Concerns regarding medicines are outlined above.   No orders of the defined types were placed in this encounter.  No orders of the defined  types were placed in this encounter.  There are no Patient Instructions on file for this visit.   I,Mathew Stumpf,acting as a Education administrator for [employer], MD.,have documented all relevant documentation on the behalf of Buford Dresser, MD,as directed by  Buford Dresser, MD while in the  presence of Buford Dresser, MD.  I, Madelin Rear, have reviewed all documentation for this visit. The documentation on 12/27/21 for the exam, diagnosis, procedures, and orders are all accurate and complete.   Signed, Buford Dresser, MD PhD 12/27/2021   [place]

## 2021-12-27 ENCOUNTER — Ambulatory Visit (INDEPENDENT_AMBULATORY_CARE_PROVIDER_SITE_OTHER): Payer: Medicare Other | Admitting: Cardiology

## 2021-12-27 ENCOUNTER — Encounter (HOSPITAL_BASED_OUTPATIENT_CLINIC_OR_DEPARTMENT_OTHER): Payer: Self-pay | Admitting: Cardiology

## 2021-12-27 ENCOUNTER — Other Ambulatory Visit: Payer: Self-pay

## 2021-12-27 VITALS — BP 118/70 | HR 64 | Ht 66.0 in | Wt 279.2 lb

## 2021-12-27 DIAGNOSIS — R0789 Other chest pain: Secondary | ICD-10-CM

## 2021-12-27 DIAGNOSIS — I422 Other hypertrophic cardiomyopathy: Secondary | ICD-10-CM | POA: Diagnosis not present

## 2021-12-27 DIAGNOSIS — I5032 Chronic diastolic (congestive) heart failure: Secondary | ICD-10-CM

## 2021-12-27 DIAGNOSIS — E119 Type 2 diabetes mellitus without complications: Secondary | ICD-10-CM | POA: Diagnosis not present

## 2021-12-27 DIAGNOSIS — I10 Essential (primary) hypertension: Secondary | ICD-10-CM

## 2021-12-27 NOTE — Patient Instructions (Signed)
Medication Instructions:  Your physician recommends that you continue on your current medications as directed. Please refer to the Current Medication list given to you today.   *If you need a refill on your cardiac medications before your next appointment, please call your pharmacy*  Lab Work: NONE  Testing/Procedures: NONE  Follow-Up: At [facility], you and your health needs are our priority.  As part of our continuing mission to provide you with exceptional heart care, we have created designated Provider Care Teams.  These Care Teams include your primary Cardiologist (physician) and Advanced Practice Providers (APPs -  Physician Assistants and Nurse Practitioners) who all work together to provide you with the care you need, when you need it.  We recommend signing up for the patient portal called "MyChart".  Sign up information is provided on this After Visit Summary.  MyChart is used to connect with patients for Virtual Visits (Telemedicine).  Patients are able to view lab/test results, encounter notes, upcoming appointments, etc.  Non-urgent messages can be sent to your provider as well.   To learn more about what you can do with MyChart, go to [email or web address].    Your next appointment:   12 month(s)  The format for your next appointment:   In Person  Provider:   Bridgette Christopher, MD     

## 2022-01-10 DIAGNOSIS — E1165 Type 2 diabetes mellitus with hyperglycemia: Secondary | ICD-10-CM | POA: Diagnosis not present

## 2022-01-10 DIAGNOSIS — E785 Hyperlipidemia, unspecified: Secondary | ICD-10-CM | POA: Diagnosis not present

## 2022-01-13 DIAGNOSIS — M1712 Unilateral primary osteoarthritis, left knee: Secondary | ICD-10-CM | POA: Diagnosis not present

## 2022-01-13 DIAGNOSIS — M5442 Lumbago with sciatica, left side: Secondary | ICD-10-CM | POA: Diagnosis not present

## 2022-01-13 DIAGNOSIS — I422 Other hypertrophic cardiomyopathy: Secondary | ICD-10-CM | POA: Diagnosis not present

## 2022-01-13 DIAGNOSIS — E1165 Type 2 diabetes mellitus with hyperglycemia: Secondary | ICD-10-CM | POA: Diagnosis not present

## 2022-01-13 DIAGNOSIS — J45991 Cough variant asthma: Secondary | ICD-10-CM | POA: Diagnosis not present

## 2022-01-13 DIAGNOSIS — G4733 Obstructive sleep apnea (adult) (pediatric): Secondary | ICD-10-CM | POA: Diagnosis not present

## 2022-01-16 DIAGNOSIS — M545 Low back pain, unspecified: Secondary | ICD-10-CM | POA: Diagnosis not present

## 2022-01-16 DIAGNOSIS — M25562 Pain in left knee: Secondary | ICD-10-CM | POA: Diagnosis not present

## 2022-02-12 DIAGNOSIS — H524 Presbyopia: Secondary | ICD-10-CM | POA: Diagnosis not present

## 2022-02-12 DIAGNOSIS — E119 Type 2 diabetes mellitus without complications: Secondary | ICD-10-CM | POA: Diagnosis not present

## 2022-02-12 DIAGNOSIS — H04123 Dry eye syndrome of bilateral lacrimal glands: Secondary | ICD-10-CM | POA: Diagnosis not present

## 2022-02-12 DIAGNOSIS — H11153 Pinguecula, bilateral: Secondary | ICD-10-CM | POA: Diagnosis not present

## 2022-02-12 DIAGNOSIS — H25813 Combined forms of age-related cataract, bilateral: Secondary | ICD-10-CM | POA: Diagnosis not present

## 2022-02-25 ENCOUNTER — Ambulatory Visit (INDEPENDENT_AMBULATORY_CARE_PROVIDER_SITE_OTHER): Payer: Medicare Other

## 2022-02-25 DIAGNOSIS — I422 Other hypertrophic cardiomyopathy: Secondary | ICD-10-CM | POA: Diagnosis not present

## 2022-02-25 LAB — CUP PACEART REMOTE DEVICE CHECK
Battery Remaining Longevity: 58 mo
Battery Remaining Percentage: 41 %
Battery Voltage: 2.96 V
Brady Statistic AP VP Percent: 1 %
Brady Statistic AP VS Percent: 7.1 %
Brady Statistic AS VP Percent: 1 %
Brady Statistic AS VS Percent: 92 %
Brady Statistic RA Percent Paced: 6.7 %
Brady Statistic RV Percent Paced: 1 %
Date Time Interrogation Session: 20230425020012
Implantable Lead Implant Date: 20160913
Implantable Lead Implant Date: 20160913
Implantable Lead Location: 753859
Implantable Lead Location: 753860
Implantable Lead Model: 1948
Implantable Pulse Generator Implant Date: 20160913
Lead Channel Impedance Value: 390 Ohm
Lead Channel Impedance Value: 560 Ohm
Lead Channel Pacing Threshold Amplitude: 0.875 V
Lead Channel Pacing Threshold Amplitude: 1.25 V
Lead Channel Pacing Threshold Pulse Width: 0.4 ms
Lead Channel Pacing Threshold Pulse Width: 0.4 ms
Lead Channel Sensing Intrinsic Amplitude: 2.1 mV
Lead Channel Sensing Intrinsic Amplitude: 4.2 mV
Lead Channel Setting Pacing Amplitude: 1.125
Lead Channel Setting Pacing Amplitude: 2.25 V
Lead Channel Setting Pacing Pulse Width: 0.4 ms
Lead Channel Setting Sensing Sensitivity: 0.7 mV
Pulse Gen Model: 2240
Pulse Gen Serial Number: 7676319

## 2022-03-13 NOTE — Progress Notes (Signed)
Remote pacemaker transmission.   

## 2022-04-04 ENCOUNTER — Other Ambulatory Visit: Payer: Self-pay | Admitting: Family Medicine

## 2022-04-04 DIAGNOSIS — M1712 Unilateral primary osteoarthritis, left knee: Secondary | ICD-10-CM | POA: Diagnosis not present

## 2022-04-04 DIAGNOSIS — Z0001 Encounter for general adult medical examination with abnormal findings: Secondary | ICD-10-CM | POA: Diagnosis not present

## 2022-04-04 DIAGNOSIS — E1165 Type 2 diabetes mellitus with hyperglycemia: Secondary | ICD-10-CM | POA: Diagnosis not present

## 2022-04-04 DIAGNOSIS — Z23 Encounter for immunization: Secondary | ICD-10-CM | POA: Diagnosis not present

## 2022-04-04 DIAGNOSIS — Z1231 Encounter for screening mammogram for malignant neoplasm of breast: Secondary | ICD-10-CM

## 2022-04-04 DIAGNOSIS — Z1239 Encounter for other screening for malignant neoplasm of breast: Secondary | ICD-10-CM | POA: Diagnosis not present

## 2022-04-04 DIAGNOSIS — G4733 Obstructive sleep apnea (adult) (pediatric): Secondary | ICD-10-CM | POA: Diagnosis not present

## 2022-04-04 DIAGNOSIS — M5442 Lumbago with sciatica, left side: Secondary | ICD-10-CM | POA: Diagnosis not present

## 2022-04-04 DIAGNOSIS — J45991 Cough variant asthma: Secondary | ICD-10-CM | POA: Diagnosis not present

## 2022-04-04 DIAGNOSIS — I422 Other hypertrophic cardiomyopathy: Secondary | ICD-10-CM | POA: Diagnosis not present

## 2022-04-25 DIAGNOSIS — J45991 Cough variant asthma: Secondary | ICD-10-CM | POA: Diagnosis not present

## 2022-04-25 DIAGNOSIS — G4733 Obstructive sleep apnea (adult) (pediatric): Secondary | ICD-10-CM | POA: Diagnosis not present

## 2022-04-25 DIAGNOSIS — E1165 Type 2 diabetes mellitus with hyperglycemia: Secondary | ICD-10-CM | POA: Diagnosis not present

## 2022-04-25 DIAGNOSIS — M1712 Unilateral primary osteoarthritis, left knee: Secondary | ICD-10-CM | POA: Diagnosis not present

## 2022-04-29 DIAGNOSIS — G4733 Obstructive sleep apnea (adult) (pediatric): Secondary | ICD-10-CM | POA: Diagnosis not present

## 2022-05-14 ENCOUNTER — Ambulatory Visit
Admission: RE | Admit: 2022-05-14 | Discharge: 2022-05-14 | Disposition: A | Discharge status: Home / Self Care | Payer: Medicare Other | Source: Ambulatory Visit | Attending: Family Medicine | Admitting: Family Medicine

## 2022-05-14 DIAGNOSIS — Z1231 Encounter for screening mammogram for malignant neoplasm of breast: Secondary | ICD-10-CM | POA: Diagnosis not present

## 2022-05-19 DIAGNOSIS — E1165 Type 2 diabetes mellitus with hyperglycemia: Secondary | ICD-10-CM | POA: Diagnosis not present

## 2022-05-19 DIAGNOSIS — J209 Acute bronchitis, unspecified: Secondary | ICD-10-CM | POA: Diagnosis not present

## 2022-05-19 DIAGNOSIS — J029 Acute pharyngitis, unspecified: Secondary | ICD-10-CM | POA: Diagnosis not present

## 2022-05-19 DIAGNOSIS — J45991 Cough variant asthma: Secondary | ICD-10-CM | POA: Diagnosis not present

## 2022-05-19 DIAGNOSIS — G4733 Obstructive sleep apnea (adult) (pediatric): Secondary | ICD-10-CM | POA: Diagnosis not present

## 2022-05-19 DIAGNOSIS — R600 Localized edema: Secondary | ICD-10-CM | POA: Diagnosis not present

## 2022-05-19 DIAGNOSIS — M1712 Unilateral primary osteoarthritis, left knee: Secondary | ICD-10-CM | POA: Diagnosis not present

## 2022-05-25 ENCOUNTER — Emergency Department: Payer: Medicare Other

## 2022-05-25 ENCOUNTER — Other Ambulatory Visit: Payer: Self-pay

## 2022-05-25 ENCOUNTER — Emergency Department
Admission: EM | Admit: 2022-05-25 | Discharge: 2022-05-25 | Disposition: A | Discharge status: Home / Self Care | Payer: Medicare Other | Attending: Emergency Medicine | Admitting: Emergency Medicine

## 2022-05-25 ENCOUNTER — Encounter: Payer: Self-pay | Admitting: Emergency Medicine

## 2022-05-25 DIAGNOSIS — E119 Type 2 diabetes mellitus without complications: Secondary | ICD-10-CM | POA: Insufficient documentation

## 2022-05-25 DIAGNOSIS — R0602 Shortness of breath: Secondary | ICD-10-CM | POA: Diagnosis not present

## 2022-05-25 DIAGNOSIS — R062 Wheezing: Secondary | ICD-10-CM | POA: Diagnosis not present

## 2022-05-25 DIAGNOSIS — I509 Heart failure, unspecified: Secondary | ICD-10-CM | POA: Diagnosis not present

## 2022-05-25 DIAGNOSIS — R0789 Other chest pain: Secondary | ICD-10-CM | POA: Diagnosis not present

## 2022-05-25 DIAGNOSIS — J209 Acute bronchitis, unspecified: Secondary | ICD-10-CM | POA: Diagnosis not present

## 2022-05-25 LAB — CBC
HCT: 39.1 % (ref 36.0–46.0)
Hemoglobin: 11.9 g/dL — ABNORMAL LOW (ref 12.0–15.0)
MCH: 27.3 pg (ref 26.0–34.0)
MCHC: 30.4 g/dL (ref 30.0–36.0)
MCV: 89.7 fL (ref 80.0–100.0)
Platelets: 274 10*3/uL (ref 150–400)
RBC: 4.36 MIL/uL (ref 3.87–5.11)
RDW: 13.3 % (ref 11.5–15.5)
WBC: 6.7 10*3/uL (ref 4.0–10.5)
nRBC: 0 % (ref 0.0–0.2)

## 2022-05-25 LAB — BASIC METABOLIC PANEL
Anion gap: 6 (ref 5–15)
BUN: 8 mg/dL (ref 6–20)
CO2: 24 mmol/L (ref 22–32)
Calcium: 9.1 mg/dL (ref 8.9–10.3)
Chloride: 113 mmol/L — ABNORMAL HIGH (ref 98–111)
Creatinine, Ser: 0.73 mg/dL (ref 0.44–1.00)
GFR, Estimated: >60 mL/min (ref >60)
Glucose, Bld: 85 mg/dL (ref 70–99)
Potassium: 3.9 mmol/L (ref 3.5–5.1)
Sodium: 143 mmol/L (ref 135–145)

## 2022-05-25 LAB — GROUP A STREP BY PCR: Group A Strep by PCR: NOT DETECTED

## 2022-05-25 MED ORDER — ALBUTEROL SULFATE HFA 108 (90 BASE) MCG/ACT IN AERS: 2.0000 | INHALATION_SPRAY | RESPIRATORY_TRACT | 0 refills | Status: AC | PRN

## 2022-05-25 MED ORDER — PSEUDOEPHEDRINE HCL 60 MG PO TABS: 60.0000 mg | ORAL_TABLET | Freq: Four times a day (QID) | ORAL | 0 refills | Status: DC | PRN

## 2022-05-25 MED ORDER — ALBUTEROL SULFATE (2.5 MG/3ML) 0.083% IN NEBU
2.5000 mg | INHALATION_SOLUTION | Freq: Once | RESPIRATORY_TRACT | Status: AC
  Administered 2022-05-25: 2.5 mg via RESPIRATORY_TRACT
  Filled 2022-05-25: qty 3

## 2022-05-25 MED ORDER — PREDNISONE 20 MG PO TABS: 60.0000 mg | ORAL_TABLET | Freq: Every day | ORAL | 0 refills | Status: AC

## 2022-05-25 NOTE — ED Triage Notes (Signed)
Patient to ED via POV for SOB/ wheezing. Pt was seen by PCP on 7/17 and given a z-pack but is not feeling better. Neg for covid, strep, and pneumonia. Pt speaking in full sentences without difficulties.

## 2022-05-25 NOTE — ED Notes (Signed)
Patient verbalizes understanding of discharge instructions. Opportunity for questioning and answers were provided. Armband removed by staff, pt discharged from ED to home 

## 2022-05-25 NOTE — ED Provider Notes (Signed)
[facility] Provider Note    Event Date/Time   First MD Initiated Contact with Patient 05/25/22 1941     (approximate)   History   Shortness of Breath   HPI  Sophia Wilkinson is a 56 y.o. female with a history of CHF, diabetes, and OSA who presents with cough, shortness of breath, nasal congestion, and sore throat over the last week.  She states she was seen earlier in the week by her primary care doctor who ruled her out for COVID, strep throat, and pneumonia, but she was started on a Z-Pak.  Her symptoms have not improved.  She reports that 2 children and her family are positive for strep.  The patient reports some bandlike pain around her lower chest wall and into her mid back which is worse with coughing and with moving her torso or turning.  She denies any leg swelling.  She has no fever or chills.    Physical Exam   Triage Vital Signs: ED Triage Vitals [05/25/22 1552]  Enc Vitals Group     BP 117/65     Pulse Rate 77     Resp 18     Temp 99.1 F (37.3 C)     Temp Source Oral     SpO2 97 %     Weight 282 lb (127.9 kg)     Height '5\' 6"'$  (1.676 m)     Head Circumference      Peak Flow      Pain Score 0     Pain Loc      Pain Edu?      Excl. in [place]?     Most recent vital signs: Vitals:   05/25/22 2022 05/25/22 2204  BP:  125/71  Pulse:  87  Resp:  18  Temp: 98.2 F (36.8 C) 98.2 F (36.8 C)  SpO2:  98%    General: Alert and oriented, relatively well-appearing. CV:  Good peripheral perfusion.  Normal heart sounds. Resp:  Normal effort.  Somewhat diminished breath sounds bilaterally with scattered wheezing. Abd:  No distention.  Other:  No peripheral edema.  No calf or popliteal swelling or tenderness.   ED Results / Procedures / Treatments   Labs (all labs ordered are listed, but only abnormal results are displayed) Labs Reviewed  BASIC METABOLIC PANEL - Abnormal; Notable for the following components:      Result Value    Chloride 113 (*)    All other components within normal limits  CBC - Abnormal; Notable for the following components:   Hemoglobin 11.9 (*)    All other components within normal limits  GROUP A STREP BY PCR     EKG  ED ECG REPORT I, Arta Silence, the attending physician, personally viewed and interpreted this ECG.  Date: 05/25/2022 EKG Time: 1602 Rate: 74 Rhythm: normal sinus rhythm QRS Axis: normal Intervals: normal ST/T Wave abnormalities: Nonspecific T wave abnormalities Narrative Interpretation: Nonspecific abnormalities with no evidence of acute ischemia; no significant change when compared to EKG of 12/27/2020    RADIOLOGY  Chest x-ray: I independently viewed and interpreted the images; there is no focal consolidation or edema  PROCEDURES:  Critical Care performed: No  Procedures   MEDICATIONS ORDERED IN ED: Medications  albuterol (PROVENTIL) (2.5 MG/3ML) 0.083% nebulizer solution 2.5 mg (2.5 mg Nebulization Given 05/25/22 2020)     IMPRESSION / MDM / ASSESSMENT AND PLAN / ED COURSE  I reviewed the triage vital signs and  the nursing notes.  56 year old female with PMH as noted above presents with URI symptoms, cough, some shortness of breath, wheezing, and chest wall discomfort over the last week which has not responded to a Z-Pak.  I reviewed the past medical records.  I do not have access to any recent record from her visit during which she was ruled out for COVID or prescribed the Z-Pak.  She has a history of hypertrophic cardiomyopathy and diastolic heart failure.  She has no recent hospital visits or admissions related to CHF.  On exam the patient is well-appearing.  Her vital signs are normal.  She is not requiring any oxygen.  There are some scattered wheezing.  Physical exam is otherwise unremarkable.  EKG is nonischemic and unchanged from prior.  Chest x-ray shows no acute findings.  Differential diagnosis includes, but is not limited to, acute  bronchitis, reactive airways, viral URI.  There is no evidence of pneumonia based on lack of fever, leukocytosis, or any chest x-ray findings.  Although the patient has some lower chest wall discomfort this is not consistent with ACS or other cardiac cause based on location, the reproducibility with movement, and associated with cough.  EKG is nonischemic.  There is no indication for further work-up.  There is no clinical evidence for PE.  The patient has no DVT symptoms, no hypoxia or tachycardia, and only intermittent pain.  Patient's presentation is most consistent with acute complicated illness / injury requiring diagnostic workup.  Labs show no leukocytosis and normal electrolytes.  The patient requested strep swab given her sick contacts and this is negative.  Overall presentation is consistent with acute bronchitis, likely viral.  I ordered an albuterol nebulizer and the patient states she is feeling well.  She is stable for discharge home at this time.  I have prescribed albuterol, prednisone, and pseudoephedrine.  I gave the patient the return precautions and she expressed understanding.    FINAL CLINICAL IMPRESSION(S) / ED DIAGNOSES   Final diagnoses:  Acute bronchitis, unspecified organism     Rx / DC Orders   ED Discharge Orders          Ordered    predniSONE (DELTASONE) 20 MG tablet  Daily with breakfast        05/25/22 2146    albuterol (VENTOLIN HFA) 108 (90 Base) MCG/ACT inhaler  Every 4 hours PRN        05/25/22 2146    pseudoephedrine (SUDAFED) 60 MG tablet  Every 6 hours PRN        05/25/22 2146             Note:  This document was prepared using Dragon voice recognition software and may include unintentional dictation errors.    Arta Silence, MD 05/25/22 2317

## 2022-05-25 NOTE — Discharge Instructions (Addendum)
Take the prednisone as prescribed for the next 5 days.  You may use the albuterol inhaler up to every 4-6 hours.  You can use the pseudoephedrine as needed for congestion.  Return to the ER for new, worsening, or persistent severe shortness of breath, chest pain, high fever, weakness, or any other new or worsening symptoms that concern you.

## 2022-05-25 NOTE — ED Provider Triage Note (Signed)
Emergency Medicine Provider Triage Evaluation Note  AMORIA MCLEES , a 56 y.o. female  was evaluated in triage.  Pt complains of shortness of breath.  She has been sick with upper respiratory infection and chest congestion for 1 week, saw PCP late last week and was placed on azithromycin.  She denies any chest pain, fevers.  Review of Systems  Positive: Shortness of breath, cough Negative: Chest pain, fever  Physical Exam  BP 117/65   Pulse 77   Temp 99.1 F (37.3 C) (Oral)   Resp 18   Ht '5\' 6"'$  (1.676 m)   Wt 127.9 kg   SpO2 97%   BMI 45.52 kg/m  Gen:   Awake, no distress   Resp:  Normal effort  MSK:   Moves extremities without difficulty  Other:    Medical Decision Making  Medically screening exam initiated at 3:57 PM.  Appropriate orders placed.  BABARA BUFFALO was informed that the remainder of the evaluation will be completed by another provider, this initial triage assessment does not replace that evaluation, and the importance of remaining in the ED until their evaluation is complete.     Duanne Guess, [place] 05/25/22 1558

## 2022-05-27 ENCOUNTER — Ambulatory Visit (INDEPENDENT_AMBULATORY_CARE_PROVIDER_SITE_OTHER): Payer: Medicare Other

## 2022-05-27 DIAGNOSIS — I422 Other hypertrophic cardiomyopathy: Secondary | ICD-10-CM | POA: Diagnosis not present

## 2022-05-27 LAB — CUP PACEART REMOTE DEVICE CHECK
Battery Remaining Longevity: 54 mo
Battery Remaining Percentage: 39 %
Battery Voltage: 2.96 V
Brady Statistic AP VP Percent: 1 %
Brady Statistic AP VS Percent: 6.3 %
Brady Statistic AS VP Percent: 1 %
Brady Statistic AS VS Percent: 93 %
Brady Statistic RA Percent Paced: 6 %
Brady Statistic RV Percent Paced: 1 %
Date Time Interrogation Session: 20230725020015
Implantable Lead Implant Date: 20160913
Implantable Lead Implant Date: 20160913
Implantable Lead Location: 753859
Implantable Lead Location: 753860
Implantable Lead Model: 1948
Implantable Pulse Generator Implant Date: 20160913
Lead Channel Impedance Value: 360 Ohm
Lead Channel Impedance Value: 550 Ohm
Lead Channel Pacing Threshold Amplitude: 0.5 V
Lead Channel Pacing Threshold Amplitude: 0.625 V
Lead Channel Pacing Threshold Pulse Width: 0.4 ms
Lead Channel Pacing Threshold Pulse Width: 0.4 ms
Lead Channel Sensing Intrinsic Amplitude: 2.6 mV
Lead Channel Sensing Intrinsic Amplitude: 3.4 mV
Lead Channel Setting Pacing Amplitude: 0.75 V
Lead Channel Setting Pacing Amplitude: 1.625
Lead Channel Setting Pacing Pulse Width: 0.4 ms
Lead Channel Setting Sensing Sensitivity: 0.7 mV
Pulse Gen Model: 2240
Pulse Gen Serial Number: 7676319

## 2022-05-29 DIAGNOSIS — M1712 Unilateral primary osteoarthritis, left knee: Secondary | ICD-10-CM | POA: Diagnosis not present

## 2022-06-18 DIAGNOSIS — M1712 Unilateral primary osteoarthritis, left knee: Secondary | ICD-10-CM | POA: Diagnosis not present

## 2022-06-18 DIAGNOSIS — J209 Acute bronchitis, unspecified: Secondary | ICD-10-CM | POA: Diagnosis not present

## 2022-06-18 DIAGNOSIS — E1165 Type 2 diabetes mellitus with hyperglycemia: Secondary | ICD-10-CM | POA: Diagnosis not present

## 2022-06-18 DIAGNOSIS — J45991 Cough variant asthma: Secondary | ICD-10-CM | POA: Diagnosis not present

## 2022-06-18 DIAGNOSIS — G4733 Obstructive sleep apnea (adult) (pediatric): Secondary | ICD-10-CM | POA: Diagnosis not present

## 2022-06-18 DIAGNOSIS — R58 Hemorrhage, not elsewhere classified: Secondary | ICD-10-CM | POA: Diagnosis not present

## 2022-06-18 DIAGNOSIS — R600 Localized edema: Secondary | ICD-10-CM | POA: Diagnosis not present

## 2022-06-19 DIAGNOSIS — R58 Hemorrhage, not elsewhere classified: Secondary | ICD-10-CM | POA: Diagnosis not present

## 2022-06-19 DIAGNOSIS — E1165 Type 2 diabetes mellitus with hyperglycemia: Secondary | ICD-10-CM | POA: Diagnosis not present

## 2022-06-23 NOTE — Progress Notes (Signed)
Remote pacemaker transmission.   

## 2022-07-21 ENCOUNTER — Telehealth: Payer: Self-pay | Admitting: Cardiology

## 2022-07-21 DIAGNOSIS — I422 Other hypertrophic cardiomyopathy: Secondary | ICD-10-CM

## 2022-07-21 NOTE — Telephone Encounter (Signed)
Pt would like a callback from nurse regarding Genetic Test. Please advise

## 2022-07-21 NOTE — Telephone Encounter (Signed)
Please advise 

## 2022-07-21 NOTE — Addendum Note (Signed)
Addended by: Gerald Stabs on: 07/21/2022 02:31 PM   Modules accepted: Orders

## 2022-07-22 DIAGNOSIS — R202 Paresthesia of skin: Secondary | ICD-10-CM | POA: Diagnosis not present

## 2022-07-22 DIAGNOSIS — J209 Acute bronchitis, unspecified: Secondary | ICD-10-CM | POA: Diagnosis not present

## 2022-07-22 DIAGNOSIS — E1165 Type 2 diabetes mellitus with hyperglycemia: Secondary | ICD-10-CM | POA: Diagnosis not present

## 2022-07-22 DIAGNOSIS — G4733 Obstructive sleep apnea (adult) (pediatric): Secondary | ICD-10-CM | POA: Diagnosis not present

## 2022-07-22 DIAGNOSIS — M1712 Unilateral primary osteoarthritis, left knee: Secondary | ICD-10-CM | POA: Diagnosis not present

## 2022-07-22 DIAGNOSIS — J45991 Cough variant asthma: Secondary | ICD-10-CM | POA: Diagnosis not present

## 2022-07-28 ENCOUNTER — Encounter: Payer: Self-pay | Admitting: Dietician

## 2022-07-28 ENCOUNTER — Encounter: Payer: Medicare Other | Attending: Internal Medicine | Admitting: Dietician

## 2022-07-28 DIAGNOSIS — E119 Type 2 diabetes mellitus without complications: Secondary | ICD-10-CM | POA: Diagnosis not present

## 2022-07-28 DIAGNOSIS — Z6841 Body Mass Index (BMI) 40.0 and over, adult: Secondary | ICD-10-CM | POA: Diagnosis present

## 2022-07-28 NOTE — Progress Notes (Signed)
Patient is here today alone.  She was last seen by a dietitian 06/2021.    Diabetes Self-Management Education  Visit Type: Follow-up  Appt. Start Time: 1640 Appt. End Time: 7425  07/28/2022  Ms. Odean Mcelwain, identified by name and date of birth, is a 56 y.o. female with a diagnosis of Diabetes:  .   ASSESSMENT She doesn't know what she wants to learn. Her grandmother was diagnosed with diabetes several years ago and patient was able to get her off of insulin. She lost 40 lbs recently following a 1500 calorie diet in 2021 but daughter had a stroke.  She states that her cholesterol is elevated.  Avoids red meat, limits starch.   Has a treadmill but current sciatic pain and now uses a stationary bike.  Does have knew problems which interferes with exercise at times. She states that her doctor is concerned about her weight. She reports multiple heart issues since she was child. Fasting blood glucose in the low 80's. Eats 1-2 meals per day, 0-2 snacks per day She is not interested in plant based nutrition but is willing to increase her vegetable intake.  Referral diagnosis:  Type 2 Diabetes, HLD, OSA on c-pap, CHF, cardiomyopathy, bradycardia, pacemaker, cervical cancer Labs noted to include A1C 5.7% on 04/04/2022 decreased from 6.3% on 01/13/2022. Medication includes:  Ozempic 2.0 mg once per week (Metformin causes dizziness), crestor, torsemide, spironolactone, potassium when she takes torsemide, steroids (July and August for acute bronchitis).  Weight hx: 66.5" 278 lbs  07/28/2022 273 lbs recently 285 lbs highest adult weight 95 lbs in 1988 lowest adult weight  Patient lives with her oldest daughter, granddaughter and raising 2 other grandchildren.  2 go to virtual school.  She and her daughter share shopping and cooking. She is on disability. Height 5' 6.5" (1.689 m), weight 278 lb (126.1 kg). Body mass index is 44.2 kg/m.   Diabetes Self-Management Education - 07/28/22 1600        Visit Information   Visit Type Follow-up      Health Coping   How would you rate your overall health? Fair      Psychosocial Assessment   Patient Belief/Attitude about Diabetes Motivated to manage diabetes    What is the hardest part about your diabetes right now, causing you the most concern, or is the most worrisome to you about your diabetes?   Other (comment)   occasional sweets   Self-care barriers None    Self-management support Doctor's office    Other persons present Patient    Patient Concerns Nutrition/Meal planning    Special Needs None    Preferred Learning Style No preference indicated    Learning Readiness Ready    How often do you need to have someone help you when you read instructions, pamphlets, or other written materials from your doctor or pharmacy? 1 - Never    What is the last grade level you completed in school? Bachelor's degree      Pre-Education Assessment   Patient understands the diabetes disease and treatment process. Needs Review    Patient understands incorporating nutritional management into lifestyle. Needs Review    Patient undertands incorporating physical activity into lifestyle. Needs Review    Patient understands using medications safely. Needs Review    Patient understands monitoring blood glucose, interpreting and using results Needs Review    Patient understands prevention, detection, and treatment of acute complications. Needs Review    Patient understands prevention, detection, and treatment of chronic  complications. Needs Review    Patient understands how to develop strategies to address psychosocial issues. Needs Review    Patient understands how to develop strategies to promote health/change behavior. Needs Review      Complications   Last HgB A1C per patient/outside source 5.7 %   04/2022 decreased from 6.5% 01/2022   How often do you check your blood sugar? 1-2 times/day    Fasting Blood glucose range (mg/dL) 70-129    Have you  had a dilated eye exam in the past 12 months? Yes    Have you had a dental exam in the past 12 months? No    Are you checking your feet? No      Dietary Intake   Breakfast Skips or honey nut cheerios, milk OR 2 frozen waffles, sugar free syrup, 2% or 1% milk    Snack (morning) none    Lunch [place] sandwich on lite bread or skips    Snack (afternoon) occasional ice cream, cakes or cookies OR PB and jelly sandwich on lite bread    Dinner chicken, fish or [place], cauliflower rice, brown rice, vegetable    Snack (evening) occasional ice cream or cake    Beverage(s) water, 1 cup regular coke or coke zero, flavored water (curkil cups)      Activity / Exercise   Activity / Exercise Type Light (walking / raking leaves)    How many days per week do you exercise? 3    How many minutes per day do you exercise? 30    Total minutes per week of exercise 90      Patient Education   Previous Diabetes Education Yes (please comment)   06/2021   Healthy Eating Meal options for control of blood glucose level and chronic complications.;Role of diet in the treatment of diabetes and the relationship between the three main macronutrients and blood glucose level;Food label reading, portion sizes and measuring food.    Being Active Role of exercise on diabetes management, blood pressure control and cardiac health.;Helped patient identify appropriate exercises in relation to his/her diabetes, diabetes complications and other health issue.    Medications Reviewed patients medication for diabetes, action, purpose, timing of dose and side effects.    Monitoring Identified appropriate SMBG and/or A1C goals.;Daily foot exams    Chronic complications Dental care      Individualized Goals (developed by patient)   Nutrition General guidelines for healthy choices and portions discussed    Physical Activity Exercise 5-7 days per week;30 minutes per day    Medications take my medication as prescribed    Problem Solving  Eating Pattern      Patient Self-Evaluation of Goals - Patient rates self as meeting previously set goals (% of time)   Nutrition 50 - 75 % (half of the time)    Physical Activity 50 - 75 % (half of the time)    Medications >75% (most of the time)    Monitoring >75% (most of the time)    Problem Solving and behavior change strategies  50 - 75 % (half of the time)    Reducing Risk (treating acute and chronic complications) 50 - 75 % (half of the time)    Health Coping 50 - 75 % (half of the time)      Post-Education Assessment   Patient understands the diabetes disease and treatment process. Demonstrates understanding / competency    Patient understands incorporating nutritional management into lifestyle. Comprehends key points  Patient undertands incorporating physical activity into lifestyle. Comprehends key points    Patient understands using medications safely. Demonstrates understanding / competency    Patient understands monitoring blood glucose, interpreting and using results Demonstrates understanding / competency    Patient understands prevention, detection, and treatment of acute complications. Demonstrates understanding / competency    Patient understands prevention, detection, and treatment of chronic complications. Demonstrates understanding / competency    Patient understands how to develop strategies to address psychosocial issues. Demonstrates understanding / competency    Patient understands how to develop strategies to promote health/change behavior. Comprehends key points      Outcomes   Expected Outcomes Demonstrated interest in learning. Expect positive outcomes    Future DMSE PRN    Program Status Completed      Subsequent Visit   Since your last visit have you continued or begun to take your medications as prescribed? Yes    Since your last visit have you had your blood pressure checked? Yes    Since your last visit have you experienced any weight changes? Gain     Since your last visit, are you checking your blood glucose at least once a day? Yes             Individualized Plan for Diabetes Self-Management Training:   Learning Objective:  Patient will have a greater understanding of diabetes self-management. Patient education plan is to attend individual and/or group sessions per assessed needs and concerns.   Plan:   Patient Instructions  Mindfulness:  Consistently scheduled meal - avoid skipping  Choices  Eat slowly  Away from distraction (sitting in kitchen or dining room)  Stop eating when satisfied  Before a snack ask, "Am I hungry or eating for another reason?"   "What can I do instead if I am not hungry?"  Try to find something every day that brings you joy!   Check your feet Continue to stay active and increase slowly as tolerated. Continue to take your medications as prescribed.  Aim for 2-3 Carb Choices per meal (30-45 grams) +/- 1 either way  Aim for 0-1 Carbs per snack if hungry  Include protein in moderation with your meals and snacks Consider checking BG at alternate times per day         Expected Outcomes:  Demonstrated interest in learning. Expect positive outcomes  Education material provided: Meal plan card  If problems or questions, patient to contact team via:  Phone  Future DSME appointment: PRN

## 2022-07-28 NOTE — Patient Instructions (Addendum)
Mindfulness:  Consistently scheduled meal - avoid skipping  Choices  Eat slowly  Away from distraction (sitting in kitchen or dining room)  Stop eating when satisfied  Before a snack ask, "Am I hungry or eating for another reason?"   "What can I do instead if I am not hungry?"  Try to find something every day that brings you joy!   Check your feet Continue to stay active and increase slowly as tolerated. Continue to take your medications as prescribed.  Aim for 2-3 Carb Choices per meal (30-45 grams) +/- 1 either way  Aim for 0-1 Carbs per snack if hungry  Include protein in moderation with your meals and snacks Consider checking BG at alternate times per day

## 2022-08-01 ENCOUNTER — Other Ambulatory Visit: Payer: Self-pay | Admitting: Family Medicine

## 2022-08-01 DIAGNOSIS — B9689 Other specified bacterial agents as the cause of diseases classified elsewhere: Secondary | ICD-10-CM

## 2022-08-01 DIAGNOSIS — J209 Acute bronchitis, unspecified: Secondary | ICD-10-CM | POA: Diagnosis not present

## 2022-08-01 DIAGNOSIS — M5416 Radiculopathy, lumbar region: Secondary | ICD-10-CM

## 2022-08-01 DIAGNOSIS — G43909 Migraine, unspecified, not intractable, without status migrainosus: Secondary | ICD-10-CM | POA: Diagnosis not present

## 2022-08-01 DIAGNOSIS — R202 Paresthesia of skin: Secondary | ICD-10-CM | POA: Diagnosis not present

## 2022-08-01 DIAGNOSIS — J45991 Cough variant asthma: Secondary | ICD-10-CM | POA: Diagnosis not present

## 2022-08-07 DIAGNOSIS — M545 Low back pain, unspecified: Secondary | ICD-10-CM | POA: Diagnosis not present

## 2022-08-08 ENCOUNTER — Other Ambulatory Visit: Payer: Medicare Other

## 2022-08-08 ENCOUNTER — Ambulatory Visit
Admission: RE | Admit: 2022-08-08 | Discharge: 2022-08-08 | Disposition: A | Discharge status: Home / Self Care | Payer: Medicare Other | Source: Ambulatory Visit | Attending: Family Medicine | Admitting: Family Medicine

## 2022-08-08 ENCOUNTER — Inpatient Hospital Stay: Admission: RE | Admit: 2022-08-08 | Payer: Medicare Other | Source: Ambulatory Visit

## 2022-08-08 DIAGNOSIS — R911 Solitary pulmonary nodule: Secondary | ICD-10-CM | POA: Diagnosis not present

## 2022-08-08 DIAGNOSIS — R918 Other nonspecific abnormal finding of lung field: Secondary | ICD-10-CM | POA: Diagnosis not present

## 2022-08-08 DIAGNOSIS — B9689 Other specified bacterial agents as the cause of diseases classified elsewhere: Secondary | ICD-10-CM

## 2022-08-11 ENCOUNTER — Other Ambulatory Visit: Payer: Self-pay | Admitting: Orthopedic Surgery

## 2022-08-11 DIAGNOSIS — M5416 Radiculopathy, lumbar region: Secondary | ICD-10-CM

## 2022-08-12 ENCOUNTER — Other Ambulatory Visit: Payer: Self-pay | Admitting: Orthopedic Surgery

## 2022-08-12 DIAGNOSIS — M5416 Radiculopathy, lumbar region: Secondary | ICD-10-CM

## 2022-08-26 ENCOUNTER — Ambulatory Visit: Payer: Medicare Other

## 2022-09-09 ENCOUNTER — Ambulatory Visit: Payer: Medicare Other | Admitting: Psychiatry

## 2022-09-15 ENCOUNTER — Ambulatory Visit (INDEPENDENT_AMBULATORY_CARE_PROVIDER_SITE_OTHER): Payer: Medicare Other | Admitting: Psychiatry

## 2022-09-15 ENCOUNTER — Telehealth: Payer: Self-pay | Admitting: Psychiatry

## 2022-09-15 ENCOUNTER — Other Ambulatory Visit (HOSPITAL_BASED_OUTPATIENT_CLINIC_OR_DEPARTMENT_OTHER): Payer: Self-pay

## 2022-09-15 VITALS — BP 127/78 | HR 64 | Ht 66.0 in | Wt 275.2 lb

## 2022-09-15 DIAGNOSIS — G43019 Migraine without aura, intractable, without status migrainosus: Secondary | ICD-10-CM

## 2022-09-15 DIAGNOSIS — M5412 Radiculopathy, cervical region: Secondary | ICD-10-CM | POA: Diagnosis not present

## 2022-09-15 MED ORDER — AMITRIPTYLINE HCL 25 MG PO TABS: ORAL_TABLET | ORAL | 6 refills | Status: DC

## 2022-09-15 MED ORDER — NURTEC 75 MG PO TBDP: 75.0000 mg | ORAL_TABLET | ORAL | 6 refills | Status: DC | PRN

## 2022-09-15 NOTE — Telephone Encounter (Signed)
[employer] medicare NPR sent to GI [phone] 

## 2022-09-15 NOTE — Progress Notes (Signed)
Referring:  Eben Burow, NP [place],  [place] [address]  PCP: Audley Hose, MD  Neurology was asked to evaluate Sophia Wilkinson, a 56 year old female for a chief complaint of headaches.  Our recommendations of care will be communicated by shared medical record.    CC:  headaches  History provided from self  HPI:  Medical co-morbidities: DM2, HLD, OSA, CHF, bradycardia s/p pacemaker, asthma, cervical cancer 2021  The patient presents for evaluation of headaches which began when she was a teenager. Currently she has 3 migraines every 2 weeks. They are associated with photophobia, phonophobia, and nausea. Migraines can last up to 2 days at a time. She takes amitriptyline at bedtime which she thinks is helpful. Denies side effects. Takes tylenol as needed which takes the edge off but does not resolve her headaches.  She also reports chronic neck pain which is worse on the left side. Pain radiates down her left arm, and it will sometimes feel weak on that side. Has intermittent paresthesias/numbness in her left hand. She has tried neck PT previously but found it ineffective. Had an MRI of her C-spine done in [place] before her pacemaker was placed, but these records are unavailable for review and she is not sure how to access them.  Headache History: Onset: as a teenager Aura: no Location: left temple Associated Symptoms:  Photophobia: yes  Phonophobia: yes  Nausea: yes Worse with activity?: yes Duration of headaches: up to 2 days  Migraine days per month: 6 Headache free days per month: 24  Current Treatment: Abortive Tylenol  Preventative Amitriptyline 25 mg QHS  Prior Therapies                                 Amitriptyline 25 mg QHS Candesartan 2 mg daily Metoprolol 25 mg daily Gabapentin 300 mg TID   LABS: CBC    Component Value Date/Time   WBC 6.7 05/25/2022 1556   RBC 4.36 05/25/2022 1556   HGB 11.9 (L) 05/25/2022 1556    HGB 13.1 05/17/2019 1217   HCT 39.1 05/25/2022 1556   HCT 40.7 05/17/2019 1217   PLT 274 05/25/2022 1556   PLT 329 05/17/2019 1217   MCV 89.7 05/25/2022 1556   MCV 86 05/17/2019 1217   MCH 27.3 05/25/2022 1556   MCHC 30.4 05/25/2022 1556   RDW 13.3 05/25/2022 1556   RDW 13.6 05/17/2019 1217      Latest Ref Rng & Units 05/25/2022    3:56 PM 06/06/2020   10:00 AM 05/17/2019   12:17 PM  CMP  Glucose 70 - 99 mg/dL 85  118  201   BUN 6 - 20 mg/dL '8  16  8   '$ Creatinine 0.44 - 1.00 mg/dL 0.73  0.93  0.80   Sodium 135 - 145 mmol/L 143  143  141   Potassium 3.5 - 5.1 mmol/L 3.9  4.3  4.4   Chloride 98 - 111 mmol/L 113  102  100   CO2 22 - 32 mmol/L '24  26  26   '$ Calcium 8.9 - 10.3 mg/dL 9.1  10.1  9.9      IMAGING:  Patient had a CTH in 2017, results are unavailable for review  Current Outpatient Medications on File Prior to Visit  Medication Sig Dispense Refill   acetaminophen (TYLENOL) 500 MG tablet Take by mouth.     albuterol (VENTOLIN  HFA) 108 (90 Base) MCG/ACT inhaler Inhale 2 puffs into the lungs every 4 (four) hours as needed for wheezing or shortness of breath. 8 g 0   candesartan (ATACAND) 4 MG tablet Take 1/2 (one-half) tablet by mouth once daily 45 tablet 1   EQ ALLERGY RELIEF 10 MG tablet Take 1 tablet by mouth once daily 90 tablet 1   fluticasone (FLONASE) 50 MCG/ACT nasal spray Place into both nostrils daily.     gabapentin (NEURONTIN) 300 MG capsule Take 300 mg by mouth 3 (three) times daily.     metoprolol succinate (TOPROL-XL) 25 MG 24 hr tablet Take 3 tablets (75 mg total) by mouth daily. 90 tablet 1   montelukast (SINGULAIR) 10 MG tablet Take 10 mg by mouth at bedtime.     potassium chloride (KLOR-CON) 10 MEQ tablet TAKE 1 TO 2 TABLETS BY MOUTH ONCE DAILY AS DIRECTED (Patient taking differently: as needed. TAKE 1 TO 2 TABLETS BY MOUTH ONCE DAILY AS DIRECTED) 90 tablet 3   rosuvastatin (CRESTOR) 20 MG tablet Take 20 mg by mouth daily.     Semaglutide (OZEMPIC, 2  MG/DOSE, [place]) Inject into the skin.     spironolactone (ALDACTONE) 25 MG tablet Take 1/2 (one-half) tablet by mouth once daily 45 tablet 1   torsemide (DEMADEX) 10 MG tablet Take 1 tablet (10 mg total) by mouth daily as needed (weight gain 3 lbs overnight or 5 lbs in a week). 90 tablet 3   ELDERBERRY PO Take 2 each by mouth daily. (Patient not taking: Reported on 07/28/2022)     empagliflozin (JARDIANCE) 10 MG TABS tablet Take 5 mg by mouth daily. (Patient not taking: Reported on 07/28/2022)     Multiple Vitamin (MULTIVITAMIN WITH MINERALS) TABS tablet Take 1 tablet by mouth daily. (Patient not taking: Reported on 07/28/2022)     Probiotic Product (PROBIOTIC-10 ULTIMATE PO) Take 1 each by mouth daily. (Patient not taking: Reported on 07/28/2022)     pseudoephedrine (SUDAFED) 60 MG tablet Take 1 tablet (60 mg total) by mouth every 6 (six) hours as needed for congestion. (Patient not taking: Reported on 07/28/2022) 20 tablet 0   No current facility-administered medications on file prior to visit.     Allergies: Allergies  Allergen Reactions   Metformin     Other reaction(s): Dizziness    Family History: Migraine or other headaches in the family:  daughters both have migraines Aneurysms in a first degree relative:  none Brain tumors in the family:  none Other neurological illness in the family:   none  Past Medical History: Past Medical History:  Diagnosis Date   Allergy    Anemia    Blood transfusion without reported diagnosis    Bradycardia    Cancer ([place])    Cardiomyopathy ([place])    CHF (congestive heart failure) ([place])    Chronic pain    Diabetes mellitus without complication ([place])    GERD (gastroesophageal reflux disease)    Heart murmur    Hyperlipidemia    Migraines    Sleep apnea     Past Surgical History Past Surgical History:  Procedure Laterality Date   ABDOMINAL HYSTERECTOMY  2016   HERNIA REPAIR     LAPAROSCOPY ABDOMEN DIAGNOSTIC     LEEP     PACEMAKER IMPLANT   2016   TUBAL LIGATION  1999    Social History: Social History   Tobacco Use   Smoking status: Never   Smokeless tobacco: Never  Vaping Use  Vaping Use: Never used  Substance Use Topics   Alcohol use: Never   Drug use: Never     ROS: Negative for fevers, chills. Positive for headaches. All other systems reviewed and negative unless stated otherwise in HPI.   Physical Exam:   Vital Signs: BP 127/78   Pulse 64   Ht '5\' 6"'$  (1.676 m)   Wt 275 lb 4 oz (124.9 kg)   BMI 44.43 kg/m  GENERAL: well appearing,in no acute distress,alert SKIN:  Color, texture, turgor normal. No rashes or lesions HEAD:  Normocephalic/atraumatic. CV:  RRR RESP: Normal respiratory effort MSK: +tenderness to palpation over left>right occiput, neck, and shoulders  NEUROLOGICAL: Mental Status: Alert, oriented to person, place and time,Follows commands Cranial Nerves: PERRL, visual fields intact to confrontation, extraocular movements intact, facial sensation intact, no facial droop or ptosis, hearing grossly intact, no dysarthria Motor: 4/5 right arm abduction, otherwise 5/5 throughout Reflexes: 2+ throughout Sensation: intact to light touch all 4 extremities Coordination: Finger-to- nose-finger intact bilaterally Gait: normal-based   IMPRESSION: 55 year old female with a history of DM2, HLD, OSA, CHF, bradycardia s/p pacemaker, asthma, cervical cancer 2021 who presents for evaluation of migraines and neck pain. Will order CT C-spine as she reports radicular symptoms down her left upper extremity. She has failed PT previously. For her migraines will increase amitriptyline dose which may help with both her headaches and neck pain. Counseled on possible side effects including weight gain. Would prefer to avoid triptans with her history of heart disease and arrhythmia requiring pacemaker. Will start Nurtec for migraine rescue.  PLAN: -CT cervical spine -Prevention: Increase amitriptyline to 37.5 mg x1  week, then increase to 50 mg QHS -Rescue: Start Nurtec 75 mg PRN -Next steps: consider SNRI, Topamax, CGRP for prevention   I spent a total of 34 minutes chart reviewing and counseling the patient. Headache education was done. Discussed treatment options including preventive and acute medications, and physical therapy. Discussed medication side effects, adverse reactions and drug interactions. Written educational materials and patient instructions outlining all of the above were given.  Follow-up: 3 months   Genia Harold, MD 09/15/2022   10:42 AM

## 2022-09-15 NOTE — Patient Instructions (Signed)
Plan:  -CT scan of the neck -Increase amitriptyline to 1.5 pills at bedtime for one week, then if no side effects can increase to 2 pills at bedtime -Start Nurtec as needed for migraines. We will submit paperwork to your insurance to get this approved

## 2022-09-16 ENCOUNTER — Telehealth: Payer: Self-pay

## 2022-09-16 NOTE — Telephone Encounter (Signed)
PA for Nurtec 75 MG Dispersible Tablets has been sent to plan via CMM. Awaiting determination from [facility].  KEY: [id number]

## 2022-09-16 NOTE — Telephone Encounter (Signed)
PA for Nurtec has been APPROVED through 10/16/2023. Pharmacy notified via fax.

## 2022-09-17 ENCOUNTER — Other Ambulatory Visit (HOSPITAL_BASED_OUTPATIENT_CLINIC_OR_DEPARTMENT_OTHER): Payer: Self-pay

## 2022-09-19 ENCOUNTER — Ambulatory Visit
Admission: RE | Admit: 2022-09-19 | Discharge: 2022-09-19 | Disposition: A | Discharge status: Home / Self Care | Payer: Medicare Other | Source: Ambulatory Visit | Attending: Psychiatry | Admitting: Psychiatry

## 2022-09-19 DIAGNOSIS — M5412 Radiculopathy, cervical region: Secondary | ICD-10-CM

## 2022-09-19 DIAGNOSIS — M542 Cervicalgia: Secondary | ICD-10-CM | POA: Diagnosis not present

## 2022-09-19 LAB — CUP PACEART REMOTE DEVICE CHECK
Battery Remaining Longevity: 52 mo
Battery Remaining Percentage: 36 %
Battery Voltage: 2.95 V
Brady Statistic AP VP Percent: 1 %
Brady Statistic AP VS Percent: 5.8 %
Brady Statistic AS VP Percent: 1 %
Brady Statistic AS VS Percent: 93 %
Brady Statistic RA Percent Paced: 5.4 %
Brady Statistic RV Percent Paced: 1 %
Date Time Interrogation Session: 20231024033958
Implantable Lead Connection Status: 753985
Implantable Lead Connection Status: 753985
Implantable Lead Implant Date: 20160913
Implantable Lead Implant Date: 20160913
Implantable Lead Location: 753859
Implantable Lead Location: 753860
Implantable Lead Model: 1948
Implantable Pulse Generator Implant Date: 20160913
Lead Channel Impedance Value: 350 Ohm
Lead Channel Impedance Value: 600 Ohm
Lead Channel Pacing Threshold Amplitude: 0.625 V
Lead Channel Pacing Threshold Amplitude: 0.75 V
Lead Channel Pacing Threshold Pulse Width: 0.4 ms
Lead Channel Pacing Threshold Pulse Width: 0.4 ms
Lead Channel Sensing Intrinsic Amplitude: 2 mV
Lead Channel Sensing Intrinsic Amplitude: 4.2 mV
Lead Channel Setting Pacing Amplitude: 1 V
Lead Channel Setting Pacing Amplitude: 1.625
Lead Channel Setting Pacing Pulse Width: 0.4 ms
Lead Channel Setting Sensing Sensitivity: 0.7 mV
Pulse Gen Model: 2240
Pulse Gen Serial Number: 7676319

## 2022-09-20 ENCOUNTER — Other Ambulatory Visit (HOSPITAL_BASED_OUTPATIENT_CLINIC_OR_DEPARTMENT_OTHER): Payer: Self-pay

## 2022-09-23 ENCOUNTER — Other Ambulatory Visit (HOSPITAL_BASED_OUTPATIENT_CLINIC_OR_DEPARTMENT_OTHER): Payer: Self-pay

## 2022-09-23 MED ORDER — OZEMPIC (2 MG/DOSE) 8 MG/3ML ~~LOC~~ SOPN
2.0000 mg | PEN_INJECTOR | SUBCUTANEOUS | 2 refills | Status: DC
  Filled 2022-09-23: qty 3, 28d supply, fill #0

## 2022-09-30 DIAGNOSIS — R29818 Other symptoms and signs involving the nervous system: Secondary | ICD-10-CM | POA: Diagnosis not present

## 2022-09-30 DIAGNOSIS — G4733 Obstructive sleep apnea (adult) (pediatric): Secondary | ICD-10-CM | POA: Diagnosis not present

## 2022-09-30 DIAGNOSIS — I421 Obstructive hypertrophic cardiomyopathy: Secondary | ICD-10-CM | POA: Diagnosis not present

## 2022-09-30 DIAGNOSIS — E559 Vitamin D deficiency, unspecified: Secondary | ICD-10-CM | POA: Diagnosis not present

## 2022-09-30 DIAGNOSIS — E782 Mixed hyperlipidemia: Secondary | ICD-10-CM | POA: Diagnosis not present

## 2022-09-30 DIAGNOSIS — E1142 Type 2 diabetes mellitus with diabetic polyneuropathy: Secondary | ICD-10-CM | POA: Diagnosis not present

## 2022-10-14 DIAGNOSIS — I421 Obstructive hypertrophic cardiomyopathy: Secondary | ICD-10-CM | POA: Diagnosis not present

## 2022-10-14 DIAGNOSIS — E782 Mixed hyperlipidemia: Secondary | ICD-10-CM | POA: Diagnosis not present

## 2022-10-14 DIAGNOSIS — E559 Vitamin D deficiency, unspecified: Secondary | ICD-10-CM | POA: Diagnosis not present

## 2022-10-14 DIAGNOSIS — G4733 Obstructive sleep apnea (adult) (pediatric): Secondary | ICD-10-CM | POA: Diagnosis not present

## 2022-10-14 DIAGNOSIS — E1142 Type 2 diabetes mellitus with diabetic polyneuropathy: Secondary | ICD-10-CM | POA: Diagnosis not present

## 2022-10-22 DIAGNOSIS — J45991 Cough variant asthma: Secondary | ICD-10-CM | POA: Diagnosis not present

## 2022-10-22 DIAGNOSIS — R202 Paresthesia of skin: Secondary | ICD-10-CM | POA: Diagnosis not present

## 2022-10-22 DIAGNOSIS — M5416 Radiculopathy, lumbar region: Secondary | ICD-10-CM | POA: Diagnosis not present

## 2022-10-22 DIAGNOSIS — K5900 Constipation, unspecified: Secondary | ICD-10-CM | POA: Diagnosis not present

## 2022-10-22 DIAGNOSIS — G43909 Migraine, unspecified, not intractable, without status migrainosus: Secondary | ICD-10-CM | POA: Diagnosis not present

## 2022-10-22 DIAGNOSIS — R1310 Dysphagia, unspecified: Secondary | ICD-10-CM | POA: Diagnosis not present

## 2022-10-22 DIAGNOSIS — M1712 Unilateral primary osteoarthritis, left knee: Secondary | ICD-10-CM | POA: Diagnosis not present

## 2022-10-22 DIAGNOSIS — E1165 Type 2 diabetes mellitus with hyperglycemia: Secondary | ICD-10-CM | POA: Diagnosis not present

## 2022-10-22 DIAGNOSIS — G4733 Obstructive sleep apnea (adult) (pediatric): Secondary | ICD-10-CM | POA: Diagnosis not present

## 2022-10-29 DIAGNOSIS — Z950 Presence of cardiac pacemaker: Secondary | ICD-10-CM | POA: Diagnosis not present

## 2022-10-29 DIAGNOSIS — G4733 Obstructive sleep apnea (adult) (pediatric): Secondary | ICD-10-CM | POA: Diagnosis not present

## 2022-11-11 ENCOUNTER — Telehealth: Payer: Medicare Other | Admitting: Genetic Counselor

## 2022-11-11 DIAGNOSIS — I422 Other hypertrophic cardiomyopathy: Secondary | ICD-10-CM

## 2022-11-11 NOTE — Progress Notes (Signed)
Pre Test Genetic Consult  Patient was seen virtually today. Patient identity was confirmed by two unique identifiers.  Referral Reason  Sophia Wilkinson is referred for genetic consult and testing for the hypertrophic cardiomyopathy upon the recommendation of her daughter's cardiologist.  Personal Medical Information Kiaira (III.1 on pedigree) is a 57 year old African American woman who reports having symptoms of chest pains, dyspnea and fatigue in 2013. She underwent cardiac imaging studies that were suggestive of HCM at age 52. She was subsequently followed by the HCM team at [facility] until her move here to [facility] in 2019. She tells me that she has a pacemaker for bradycardia and was also found to have CHF while being evaluated at [facility].    Traditional Risk Factors Pauline denies having HTN or aortic valve stenosis that can also lead to cardiac hypertrophy.  Family history  Relation to Proband Pedigree # Current age Heart condition/age of onset Notes  Daughters, 4 IV.1-IV.4 37, 35, 31, 27 Bradycardia in IV.1, IV.4 IV.3- cMRI (10/01/22) -1.3cm @ basal septum, Stroke @ 28; Echo/EKG ND in other daughters  Son IV.5 Deceased None Died @ 18-passenger in MVA        Father II.2 Deceased Heart disease- details? Died @ 61- likely from substance abuse and/or heart disease  Paternal uncle II.1 76 Blood clots   Paternal grandfather I.1 Deceased ? Died @ ? - cause unknown  Paternal grandmother I.2 Deceased M.I, 3 vessel CABG Died @ 57s - poor health        Mother II.3 Deceased Heart murmur Died @ 54- poor health, Hep B, drug user  Maternal uncle II.4 Deceased None Died of stroke complications @ 70  Maternal grandfather I.3 Deceased None Died of M.S. @ 8s   Maternal grandmother I.4 Deceased Heart issues- details? Died @ 75 - breast cancer   Genetics Cheralyn was counseled on the genetics of hypertrophic cardiomyopathy (HCM). I explained to the patient that this is an autosomal dominant condition with  incomplete penetrance i.e. not all individuals harboring the HCM mutation will present clinically with HCM, and age-related penetrance where clinical presentation of HCM increases with advanced age.   Since HCM is an autosomal dominant condition, first degree-relatives should seek regular surveillance for HCM by EKG and echocardiogram.  Explained to patient that frequency is typically determined by age, with those in their teens undergoing screening every year and those over the age of 9 getting screened every 3-5 years. Patient verbalized understanding of this.   I informed the patient that about 8-10% of HCM patients can have compound and digenic mutations for HCM. Also briefly discussed the inheritance pattern and treatment /management plans for the infiltrative cardiomyopathies that present as HCM phenocopies.   We walked through the process of genetic testing. I explained to the patient that genetic testing is a probabilistic test dependent upon age and severity of presentation, presence of risk factors for HCM and importantly family history of HCM or sudden death in first-degree relatives.   The potential outcomes of genetic testing and subsequent management of at-risk family members were discussed to manage expectations-  If a mutation is not identified, then all first-degree relatives should undergo regular screening for HCM. I emphasized that even if the genetic test is negative, it does not mean that he does not have HCM. A negative test result can be due to limitations of the genetic test. Patient verbalized understanding of this.  There is also the likelihood of identifying a "Variant of unknown significance." This result  means that the variant has not been detected in a statistically significant number of HCM patients and/or functional studies have not been performed to verify its pathogenicity. This VUS can be tested in the family to see if it segregates with disease. If a VUS is found,  first-degree relatives should undergo regular clinical screening for HCM.  If a pathogenic variant is reported, then first-degree family members can get tested for this variant. If they test positive, it is likely they will develop HCM. Considering variable expression and incomplete penetrance associated with HCM, it is not possible to predict when they will manifest clinically with HCM. It is recommended that family members that test positive for the familial pathogenic variant pursue clinical screening for HCM. Family members that test negative for the familial mutation need not pursue periodic screening for HCM, but seek care if symptoms develop.  Impression  Raylene was found to have cardiac wall thickness suggestive of HCM at age 46. Her early age of presentation and severity of symptoms is indicative of a genetic condition. However, there is no family history of HCM or sudden death making it highly likely that she has a de novo mutation for HCM or that there is reduced penetrance as her parents died relatively young. Her daughter Mabeline Caras, [place]: 02/02/1991) was recently seen by Dr. Gasper Sells and underwent an MRI that detected basal septal hypertrophy of 1.3cm, a value that meets criteria for HCM as a first-degree relative.   Genetic testing for HCM is recommended. This test should include the sarcomeric genes implicated in HCM as well as the genes for the HCM phenocopies, such as Fabry disease, Danon disease, WPW syndrome and familial transthyretin amyloidosis.  In addition, we discussed the protections afforded by the Genetic Information Non-Discrimination Act (GINA). I explained to the patient that GINA protects the patient from losing their employment or health insurance based on their genotype. However, these protections do not cover life insurance and disability. Patient verbalized understanding of this and informs me that she used to be a life insurance agent and hence her children and  grandchildren have life insurance.   Please note that the patient has not been counseled in this visit on personal, cultural, or ethical issues that they may face due to their heart condition.   Plan After a thorough discussion of the risk and benefits of genetic testing for HCM, Ceclia declines genetic testing for HCM due to insurance non-coverage. She plans to have her daughters undergo screening for HCM at regular intervals.   Lattie Corns, Ph.D, [facility] Clinical Molecular Geneticist

## 2022-11-14 ENCOUNTER — Encounter (HOSPITAL_BASED_OUTPATIENT_CLINIC_OR_DEPARTMENT_OTHER): Payer: Self-pay | Admitting: Pulmonary Disease

## 2022-11-14 ENCOUNTER — Ambulatory Visit (INDEPENDENT_AMBULATORY_CARE_PROVIDER_SITE_OTHER): Payer: Medicare Other | Admitting: Pulmonary Disease

## 2022-11-14 VITALS — BP 118/78 | HR 79 | Temp 98.8°F | Ht 66.0 in | Wt 272.0 lb

## 2022-11-14 DIAGNOSIS — J45909 Unspecified asthma, uncomplicated: Secondary | ICD-10-CM | POA: Diagnosis not present

## 2022-11-14 DIAGNOSIS — G4733 Obstructive sleep apnea (adult) (pediatric): Secondary | ICD-10-CM | POA: Diagnosis not present

## 2022-11-14 DIAGNOSIS — R053 Chronic cough: Secondary | ICD-10-CM | POA: Insufficient documentation

## 2022-11-14 DIAGNOSIS — Z9189 Other specified personal risk factors, not elsewhere classified: Secondary | ICD-10-CM

## 2022-11-14 DIAGNOSIS — R911 Solitary pulmonary nodule: Secondary | ICD-10-CM

## 2022-11-14 LAB — PULMONARY FUNCTION TEST
FEF 25-75 Post: 2.61 L/s
FEF 25-75 Pre: 2.17 L/s
FEF2575-%Change-Post: 20 %
FEF2575-%Pred-Post: 97 %
FEF2575-%Pred-Pre: 80 %
FEV1-%Change-Post: 2 %
FEV1-%Pred-Post: 72 %
FEV1-%Pred-Pre: 70 %
FEV1-Post: 2.08 L
FEV1-Pre: 2.03 L
FEV1FVC-%Change-Post: 6 %
FEV1FVC-%Pred-Pre: 104 %
FEV6-%Change-Post: -1 %
FEV6-%Pred-Post: 66 %
FEV6-%Pred-Pre: 67 %
FEV6-Post: 2.36 L
FEV6-Pre: 2.39 L
FEV6FVC-%Change-Post: 2 %
FEV6FVC-%Pred-Post: 103 %
FEV6FVC-%Pred-Pre: 100 %
FVC-%Change-Post: -4 %
FVC-%Pred-Post: 64 %
FVC-%Pred-Pre: 66 %
FVC-Post: 2.36 L
FVC-Pre: 2.46 L
Post FEV1/FVC ratio: 88 %
Post FEV6/FVC ratio: 100 %
Pre FEV1/FVC ratio: 83 %
Pre FEV6/FVC Ratio: 97 %

## 2022-11-14 NOTE — Assessment & Plan Note (Signed)
With clear imaging, differential includes usual culprits such as upper airway cough syndrome or reflux.  I doubt cough variant asthma, no airway obstruction on spirometry is reassuring without bronchodilator response although we did not perform methacholine challenge test yet. I will treat her sequentially for postnasal drip and reflux. Initially with chlorpheniramine, will avoid decongestant given her history of heart disease. If she has no response to this then we will use PPI for 4 weeks

## 2022-11-14 NOTE — Assessment & Plan Note (Signed)
She is currently not using her CPAP. Sleep study has been scheduled at [facility]

## 2022-11-14 NOTE — Patient Instructions (Addendum)
Trial of chlorpheniramine 4 mg at bedtime x 3 weeks  Take instead of claritin  If this does not work, report back we will start you on reflux medication  Get back on flovent  X spirometry pre-post

## 2022-11-14 NOTE — Progress Notes (Signed)
Subjective:    Patient ID: Sophia Wilkinson, female    DOB: 12/17/1965, 57 y.o.   MRN: [id number]  HPI  Chief Complaint  Patient presents with   Consult    Pt states she has a productive cough x 3 months. Pt states she is staying congestion and hard to swallow.. Pt states she had a Ct Scan and they found some small nodules   57 year old never smoker presents for evaluation of chronic cough and pulmonary nodule She carries a diagnosis of cough variant asthma, was placed on Flovent but has not used this in the last few months. She reports onset of cough 4 months ago, initially with URI symptoms, she received 2 rounds of Z-Pak and a cough syrup and a round of prednisone, initially clear sputum but lately has been little yellow tinge, cough is worse in the mornings, not relieved by OTC medications She report perennial allergies for which she takes Singulair, fluticasone nasal spray and loratadine She reports occasional postnasal drip. 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  NPSG 05/14/18 AHI 12.1/ hr, desaturation to 83%, body weight 277 lbs   07/2018 CPAP titration >> 10 cm  Spirometry 05/2022 [PCP] FEV1 72% Spirometry 11/2022 mild restriction, ratio 83, FEV1 70%, FVC 66%, no bronchodilator response   Past Medical History:  Diagnosis Date   Allergy    Anemia    Blood transfusion without reported diagnosis    Bradycardia    [place]    [place]iomyopathy ([place]    CHF (congestive heart failure) (HCC)    Chronic pain    Diabetes  mellitus without complication (HCC)    GERD (gastroesophageal reflux disease)    Heart murmur    Hyperlipidemia    Migraines    Sleep apnea    Past Surgical History:  Procedure Laterality Date   ABDOMINAL HYSTERECTOMY  2016   HERNIA REPAIR     LAPAROSCOPY ABDOMEN DIAGNOSTIC     LEEP     PACEMAKER IMPLANT  2016   TUBAL LIGATION  1999    Allergies  Allergen Reactions   Metformin     Other reaction(s): Dizziness    Social History   Socioeconomic History   Marital status: Single    Spouse name: Not on file   Number of children: 5   Years of education: 4 year college   Highest education level: Bachelor's degree (e.g., BA, AB, BS)  Occupational History   Not on file  Tobacco Use   Smoking status: Never    Passive exposure: Past   Smokeless tobacco: Never  Vaping Use   Vaping Use: Never used  Substance and Sexual Activity   Alcohol use: Never   Drug use: Never   Sexual activity: Not Currently    Birth control/protection: Surgical  Other Topics Concern   Not on file  Social History Narrative   Lives with daughter and two grandkids in a manufactured home. Has steps into home (3). No pets. Likes to spend time with grandkids, reading, computer. Has smoke alarms, no throw rugs, no cords across floors. No grab bars.  Social Determinants of Health   Financial Resource Strain: Low Risk  (07/14/2018)   Overall Financial Resource Strain (CARDIA)    Difficulty of Paying Living Expenses: Not hard at all  Food Insecurity: No Food Insecurity (05/01/2021)   Hunger Vital Sign    Worried About Running Out of Food in the Last Year: Never true    [place] in the Last Year: Never true  Transportation Needs: No Transportation Needs (07/14/2018)   PRAPARE - Hydrologist (Medical): No    Lack of Transportation (Non-Medical): No  Physical Activity: Sufficiently Active (07/14/2018)   Exercise Vital Sign    Days of Exercise per Week: 7 days    Minutes  of Exercise per Session: 60 min  Stress: Stress Concern Present (07/14/2018)   [place]    Feeling of Stress : To some extent  Social Connections: Moderately Integrated (07/14/2018)   Social Connection and Isolation Panel [NHANES]    Frequency of Communication with Friends and Family: More than three times a week    Frequency of Social Gatherings with Friends and Family: More than three times a week    Attends Religious Services: More than 4 times per year    Active Member of [employer] or Organizations: Yes    Attends Archivist Meetings: 1 to 4 times per year    Marital Status: Never married  Intimate Partner Violence: Not At Risk (07/14/2018)   Humiliation, Afraid, Rape, and Kick questionnaire    Fear of Current or Ex-Partner: No    Emotionally Abused: No    Physically Abused: No    Sexually Abused: No    Family History  Problem Relation Age of Onset   Alcohol abuse Mother    Liver disease Mother    Alcohol abuse Father    Drug abuse Father    Alcohol abuse Sister    Heart disease Maternal Grandmother    Diabetes Maternal Grandmother    Hyperlipidemia Maternal Grandmother    Heart disease Paternal Grandmother    Diabetes Sister    Hypertension Daughter    Diabetes Daughter    Colon cancer Neg Hx    Esophageal cancer Neg Hx    Rectal cancer Neg Hx    Stomach cancer Neg Hx      Review of Systems Shortness of breath with activity Productive cough Irregular heartbeat Difficulty swallowing Headaches and nasal congestion Itching Feet swelling Joint stiffness    Objective:   Physical Exam  Gen. Pleasant, obese, in no distress ENT - no lesions, no post nasal drip Neck: No JVD, no thyromegaly, no carotid bruits Lungs: no use of accessory muscles, no dullness to percussion, decreased without rales or rhonchi  Cardiovascular: Rhythm regular, heart sounds  normal, no murmurs or gallops, no  peripheral edema Musculoskeletal: No deformities, no cyanosis or clubbing , no tremors       Assessment & Plan:

## 2022-11-14 NOTE — Assessment & Plan Note (Signed)
Reviewed images, largest nodule is 4 mm, low risk and this never smoker for malignancy. She will need 1 year follow-up CT chest without contrast which can be scheduled in October 2024

## 2022-11-14 NOTE — Patient Instructions (Signed)
Pre-Post Spirometry  

## 2022-11-14 NOTE — Progress Notes (Signed)
Pre-Post Spirometry  

## 2022-11-19 DIAGNOSIS — G4733 Obstructive sleep apnea (adult) (pediatric): Secondary | ICD-10-CM | POA: Diagnosis not present

## 2022-11-19 DIAGNOSIS — E1142 Type 2 diabetes mellitus with diabetic polyneuropathy: Secondary | ICD-10-CM | POA: Diagnosis not present

## 2022-11-19 DIAGNOSIS — E782 Mixed hyperlipidemia: Secondary | ICD-10-CM | POA: Diagnosis not present

## 2022-11-19 DIAGNOSIS — E559 Vitamin D deficiency, unspecified: Secondary | ICD-10-CM | POA: Diagnosis not present

## 2022-11-25 ENCOUNTER — Ambulatory Visit: Payer: 59 | Attending: Cardiovascular Disease

## 2022-11-25 DIAGNOSIS — I422 Other hypertrophic cardiomyopathy: Secondary | ICD-10-CM

## 2022-11-25 LAB — CUP PACEART REMOTE DEVICE CHECK
Battery Remaining Longevity: 49 mo
Battery Remaining Percentage: 34 %
Battery Voltage: 2.95 V
Brady Statistic AP VP Percent: 1 %
Brady Statistic AP VS Percent: 5.3 %
Brady Statistic AS VP Percent: 1 %
Brady Statistic AS VS Percent: 94 %
Brady Statistic RA Percent Paced: 5 %
Brady Statistic RV Percent Paced: 1 %
Date Time Interrogation Session: 20240123020012
Implantable Lead Connection Status: 753985
Implantable Lead Connection Status: 753985
Implantable Lead Implant Date: 20160913
Implantable Lead Implant Date: 20160913
Implantable Lead Location: 753859
Implantable Lead Location: 753860
Implantable Lead Model: 1948
Implantable Pulse Generator Implant Date: 20160913
Lead Channel Impedance Value: 410 Ohm
Lead Channel Impedance Value: 580 Ohm
Lead Channel Pacing Threshold Amplitude: 0.625 V
Lead Channel Pacing Threshold Amplitude: 1 V
Lead Channel Pacing Threshold Pulse Width: 0.4 ms
Lead Channel Pacing Threshold Pulse Width: 0.4 ms
Lead Channel Sensing Intrinsic Amplitude: 2.2 mV
Lead Channel Sensing Intrinsic Amplitude: 3.6 mV
Lead Channel Setting Pacing Amplitude: 1.25 V
Lead Channel Setting Pacing Amplitude: 1.625
Lead Channel Setting Pacing Pulse Width: 0.4 ms
Lead Channel Setting Sensing Sensitivity: 0.7 mV
Pulse Gen Model: 2240
Pulse Gen Serial Number: 7676319

## 2022-12-03 DIAGNOSIS — G4733 Obstructive sleep apnea (adult) (pediatric): Secondary | ICD-10-CM | POA: Diagnosis not present

## 2022-12-17 DIAGNOSIS — E1142 Type 2 diabetes mellitus with diabetic polyneuropathy: Secondary | ICD-10-CM | POA: Diagnosis not present

## 2022-12-17 DIAGNOSIS — E782 Mixed hyperlipidemia: Secondary | ICD-10-CM | POA: Diagnosis not present

## 2022-12-17 DIAGNOSIS — G4733 Obstructive sleep apnea (adult) (pediatric): Secondary | ICD-10-CM | POA: Diagnosis not present

## 2022-12-17 DIAGNOSIS — I421 Obstructive hypertrophic cardiomyopathy: Secondary | ICD-10-CM | POA: Diagnosis not present

## 2022-12-17 DIAGNOSIS — E559 Vitamin D deficiency, unspecified: Secondary | ICD-10-CM | POA: Diagnosis not present

## 2022-12-19 ENCOUNTER — Ambulatory Visit (HOSPITAL_BASED_OUTPATIENT_CLINIC_OR_DEPARTMENT_OTHER): Payer: Medicare Other | Admitting: Pulmonary Disease

## 2022-12-19 DIAGNOSIS — G4733 Obstructive sleep apnea (adult) (pediatric): Secondary | ICD-10-CM | POA: Diagnosis not present

## 2022-12-22 ENCOUNTER — Ambulatory Visit: Payer: Medicare Other | Admitting: Psychiatry

## 2022-12-23 ENCOUNTER — Ambulatory Visit: Payer: Medicare Other | Admitting: Psychiatry

## 2022-12-23 NOTE — Progress Notes (Signed)
Remote pacemaker transmission.   

## 2022-12-25 ENCOUNTER — Encounter (HOSPITAL_BASED_OUTPATIENT_CLINIC_OR_DEPARTMENT_OTHER): Payer: Self-pay | Admitting: Cardiology

## 2022-12-25 ENCOUNTER — Ambulatory Visit (INDEPENDENT_AMBULATORY_CARE_PROVIDER_SITE_OTHER): Payer: 59 | Admitting: Cardiology

## 2022-12-25 VITALS — BP 100/70 | HR 68 | Ht 66.0 in | Wt 270.0 lb

## 2022-12-25 DIAGNOSIS — I5032 Chronic diastolic (congestive) heart failure: Secondary | ICD-10-CM

## 2022-12-25 DIAGNOSIS — E119 Type 2 diabetes mellitus without complications: Secondary | ICD-10-CM

## 2022-12-25 DIAGNOSIS — I10 Essential (primary) hypertension: Secondary | ICD-10-CM | POA: Diagnosis not present

## 2022-12-25 DIAGNOSIS — I422 Other hypertrophic cardiomyopathy: Secondary | ICD-10-CM

## 2022-12-25 DIAGNOSIS — R0789 Other chest pain: Secondary | ICD-10-CM | POA: Diagnosis not present

## 2022-12-25 DIAGNOSIS — E7800 Pure hypercholesterolemia, unspecified: Secondary | ICD-10-CM

## 2022-12-25 MED ORDER — METOPROLOL SUCCINATE ER 25 MG PO TB24: 75.0000 mg | ORAL_TABLET | Freq: Every day | ORAL | 3 refills | Status: DC

## 2022-12-25 NOTE — Patient Instructions (Signed)
Medication Instructions:  The current medical regimen is effective;  continue present plan and medications.   *If you need a refill on your cardiac medications before your next appointment, please call your pharmacy*   Lab Work: None   Testing/Procedures: None   Follow-Up: At [facility], you and your health needs are our priority.  As part of our continuing mission to provide you with exceptional heart care, we have created designated Provider Care Teams.  These Care Teams include your primary Cardiologist (physician) and Advanced Practice Providers (APPs -  Physician Assistants and Nurse Practitioners) who all work together to provide you with the care you need, when you need it.  We recommend signing up for the patient portal called "MyChart".  Sign up information is provided on this After Visit Summary.  MyChart is used to connect with patients for Virtual Visits (Telemedicine).  Patients are able to view lab/test results, encounter notes, upcoming appointments, etc.  Non-urgent messages can be sent to your provider as well.   To learn more about what you can do with MyChart, go to [email or web address].    Your next appointment:   3 month(s)  Provider:   Buford Dresser, MD    Other Instructions None

## 2022-12-25 NOTE — Progress Notes (Signed)
Cardiology Office Note:    Date:  12/26/2022   [name], DOB 05/23/1966, MRN [id number]  PCP:  Kathyrn Lass, MD  Cardiologist:  Buford Dresser, MD PhD Pacemaker: Dr. Sallyanne Kuster  Referring MD: Audley Hose, MD   CC: follow up  History of Present Illness:    Sophia Wilkinson is a 57 y.o. female with a hx of hypertrophic cardiomyopathy, type II diabetes, hypertension, hyperlipidemia who is seen in follow-up today. She was initially seen as a new consult at the request of Dr. Ky Barban for evaluation and management of heart failure on 06/18/18.   She has a past medical history of hypertrophic cardiomyopathy without significant obstruction, chronic diastolic heart failure, morbid obesity, obstructive sleep apnea on CPAP, and bradycardia s/p PPM. She had been followed by [facility] until moving to [facility] 11/2017. She denies family history of sudden cardiac death. No syncope. No known history of NSVT/VT. Last [facility] note summarized in initial consult note.  Last visit she had been suffering from occasional muscle cramps in her left chest which worsened with movement. She had been gaining weight, and had some shortness of breath with activity.  Today the patient states that she has been doing poorly. She complains of a poking feeling in her chest, it never limits her, she just describes it as "uncomfortable". She has no associated syncope and it isn't onset with activity.   Her blood pressure today in clinic is perfect at 100/70. She is still taking her fluid pill when she notices edema in the mornings.  She has been going to the wellness center and has lost 13 lbs in her first few weeks of starting. She has been off of her regimen due to stress, she plans to get back to exercising soon.   She has restarted her CPAP, and was told that is she lost more weight the CPAP wouldn't be necessary anymore.  Her granddaughter was taken into foster care which is very stressful for  her.   She denies any palpitations, shortness of breath, or peripheral edema. No lightheadedness, headaches, syncope, orthopnea, or PND.   Past Medical History:  Diagnosis Date   Allergy    Anemia    Blood transfusion without reported diagnosis    Bradycardia    Cancer ([place])    Cardiomyopathy ([place])    CHF (congestive heart failure) (HCC)    Chronic pain    Diabetes mellitus without complication (HCC)    GERD (gastroesophageal reflux disease)    Heart murmur    Hyperlipidemia    Migraines    Sleep apnea     Past Surgical History:  Procedure Laterality Date   ABDOMINAL HYSTERECTOMY  2016   HERNIA REPAIR     LAPAROSCOPY ABDOMEN DIAGNOSTIC     LEEP     PACEMAKER IMPLANT  2016   TUBAL LIGATION  1999    Current Medications: Current Outpatient Medications on File Prior to Visit  Medication Sig   acetaminophen (TYLENOL) 650 MG CR tablet Take 650 mg by mouth 2 (two) times daily.   albuterol (VENTOLIN HFA) 108 (90 Base) MCG/ACT inhaler Inhale 2 puffs into the lungs every 4 (four) hours as needed for wheezing or shortness of breath.   amitriptyline (ELAVIL) 25 MG tablet Take 1.5 pills at bedtime for one week, then increase to 2 pills at bedtime   candesartan (ATACAND) 4 MG tablet Take 1/2 (one-half) tablet by mouth once daily   chlorpheniramine (CHLOR-TRIMETON) 4 MG tablet Take 4 mg  by mouth 2 (two) times daily as needed for allergies.   diclofenac Sodium (VOLTAREN) 1 % GEL Apply 2 g topically as needed.   fluticasone (FLONASE) 50 MCG/ACT nasal spray Place into both nostrils daily.   gabapentin (NEURONTIN) 300 MG capsule Take 300 mg by mouth 3 (three) times daily.   montelukast (SINGULAIR) 10 MG tablet Take 10 mg by mouth at bedtime.   MOUNJARO 7.5 MG/0.5ML Pen Inject 7.5 mg into the skin once a week.   Multiple Vitamin (MULTIVITAMIN WITH MINERALS) TABS tablet Take 1 tablet by mouth daily.   potassium chloride (KLOR-CON) 10 MEQ tablet Take 10 mEq by mouth as needed (with the fluid  pill).   pseudoephedrine (SUDAFED) 60 MG tablet Take 1 tablet (60 mg total) by mouth every 6 (six) hours as needed for congestion.   Rimegepant Sulfate (NURTEC) 75 MG TBDP Take 75 mg by mouth as needed (for migraine). Max dose 1 pill in 24 hours   rosuvastatin (CRESTOR) 20 MG tablet Take 20 mg by mouth daily.   senna-docusate (SENOKOT-S) 8.6-50 MG tablet Take 1 tablet by mouth every morning.   spironolactone (ALDACTONE) 25 MG tablet Take 1/2 (one-half) tablet by mouth once daily   torsemide (DEMADEX) 10 MG tablet Take 1 tablet (10 mg total) by mouth daily as needed (weight gain 3 lbs overnight or 5 lbs in a week).   Vitamin D, Ergocalciferol, (DRISDOL) 1.25 MG (50000 UNIT) CAPS capsule Take 50,000 Units by mouth once a week.   No current facility-administered medications on file prior to visit.     Allergies:   Metformin   Social History   Tobacco Use   Smoking status: Never    Passive exposure: Past   Smokeless tobacco: Never  Vaping Use   Vaping Use: Never used  Substance Use Topics   Alcohol use: Never   Drug use: Never    Family History: The patient's family history includes Alcohol abuse in her father, mother, and sister; Diabetes in her daughter, maternal grandmother, and sister; Drug abuse in her father; Heart disease in her maternal grandmother and paternal grandmother; Hyperlipidemia in her maternal grandmother; Hypertension in her daughter; Liver disease in her mother. There is no history of Colon cancer, Esophageal cancer, Rectal cancer, or Stomach cancer.  Mother passed away age 23 from liver disease. Father passed away at age 49 from overdose. Grandparents passed in their 27s from cancer, MS, and heart disease. Brother living at age 16.   ROS:   Please see the history of present illness. (+) Chest Pressure All other systems are reviewed and negative.    EKGs/Labs/Other Studies Reviewed:    The following studies were reviewed today: Notes through [place] in  initial consult note.   Echo Stress 07/07/18 Study Conclusions  - Stress ECG conclusions: The stress ECG was normal. This score   predicts a low risk of cardiac events. - Impressions: Normal stress echo   Impressions:  - Normal study after maximal exercise. Normal stress echo  HCM treadmill stress echo 01/09/2017: CONCLUSIONS  Non-obstructive HCM.   Reduced exercise capacity for age (7.2 METs, 90% of age predicted).   Appropriate blood pressure response to exertion.   No evidence of exercise induced ischemia by echocardiography at a sub- adequate heart rate. Please note that the HCM echocardiogram is focused  on the assessment of hemodynamics (peak outflow tract gradient) at peak  stress rather than wall motion analysis. Wall motion images are collected  later after termination of stress and  thus it is likely that ischemic findings   will be missed at lower heart rates. Inducible ischemia at higher workloads cannot be ruled out.  While no wall motion abnormalities were noted on  this study, this study was performed to evaluate LV outflow obstruction  with exercise rather than being optimized to evaluate for stress induced  wall motion.    Calculated gradients assume an LA pressure of 10 mmHg. Gradients may  be underestimated if the true peak MR velocity is not captured. Direct  gradients may be overestimated if the Doppler signal is contaminated.  Treadmill Results  Protocol:           [place]ruce                   Duration (min:sec):06:11   METS:  Stage:                Amyl Nitrate      BP: (mmHg)   HR:  Comments:        Max HR: 131      % MPHR     77                        (mL inhaled)    Sys         Diast(bpm)    Baseline                            111     52     65                                      151     77     75  2                                   168     74     131  Peak                                               131  1 min recovery                      135     66     100   Recovery                            117     46     77  Recovery                            110     50     72   Reason for Termination:Dyspnea  Symptoms:      Complained of shortness of breath and appeared dyspneic throughout exercise.  Recovery Time (min:sec):05:33   FINDINGS   EKG FINDINGS  Baseline:  Normal sinus rhythm, nonspecific T-wave changes.   Peak:  Sinus tachycardia with occasional PVCs, no ischemic EKG changes.   METS:7.20  Hyperdynamic LV systolic function, LVEF estimated at 75-80%. All  LV  walls augment appropriately.  Near LV cavity obliteration. Peak LVOT  gradient with exercise of 14 mmHg.  Chordal SAM.  Recovery:  Normal sinus rhythm with frequent PVCs and ventricular couplet in early  recovery, no ischemic EKG changes.  Hyperdynamic LV systolic function, LVEF estimated at 70%.  No regional  wall motion abnormalities. Chordal SAM.   Echo 01/09/2017 CONCLUSIONS   Normal left ventricular cavity size. Severe asymetric left ventricular   hypertropy (septum 2.4 cm, posterior wall 1.1 cm).  Increased   (hyperdynamic) left ventricular ejection fraction. LVEF estimated at   70-75%.  No regional wall motion abnormalities.  Normal diastolic   filling pattern for age.     Normal right ventricular size. Normal right ventricular global    systolic function.    Systolic anterior motion of the mitral valve chordae. No significant   LVOT gradient at rest or with Valsalva (< 10 mmHg); post-PVC   gradient 14 mmHg.     No significant valve dysfunction.   EKG:  EKG is personally reviewed today.   12/25/2022: not ordered today 12/27/2021: NSR at 64 bpm 06/25/2021: ECG not ordered 12/27/2020: sinus bradycardia at 54 bpm, unchanged T wave pattern  Recent Labs: 05/25/2022: BUN 8; Creatinine, Ser 0.73; Hemoglobin 11.9; Platelets 274; Potassium 3.9; Sodium 143   Recent Lipid Panel    Component Value Date/Time   CHOL 204 (H) 06/06/2020 1000   TRIG 77 06/06/2020 1000   HDL 39 (L)  06/06/2020 1000   CHOLHDL 5.2 (H) 06/06/2020 1000   LDLCALC 151 (H) 06/06/2020 1000    Physical Exam:    VS:  BP 100/70 (BP Location: Right Arm, Patient Position: Sitting, Cuff Size: Large)   Pulse 68   Ht '5\' 6"'$  (1.676 m)   Wt 270 lb (122.5 kg)   SpO2 95%   BMI 43.58 kg/m     Wt Readings from Last 3 Encounters:  12/25/22 270 lb (122.5 kg)  11/14/22 272 lb (123.4 kg)  09/15/22 275 lb 4 oz (124.9 kg)   GEN: Well nourished, well developed in no acute distress HEENT: Normal, moist mucous membranes NECK: No JVD CARDIAC: regular rhythm, normal S1 and S2, no rubs or gallops. No murmur at rest or with valsalva VASCULAR: Radial and DP pulses 2+ bilaterally. No carotid bruits RESPIRATORY:  Clear to auscultation without rales, wheezing or rhonchi  ABDOMEN: Soft, non-tender, non-distended MUSCULOSKELETAL:  Ambulates independently SKIN: Warm and dry, no edema NEUROLOGIC:  Alert and oriented x 3. No focal neuro deficits noted. PSYCHIATRIC:  Normal affect    ASSESSMENT:    1. Hypertrophic cardiomyopathy ([place])   2. Chronic diastolic congestive heart failure ([place])   3. Type 2 diabetes mellitus without complication, without long-term current use of insulin ([place])   4. Morbid obesity ([place])   5. Essential hypertension   6. Other chest pain   7. Pure hypercholesterolemia     PLAN:    Hypertrophic cardiomyopathy Chronic diastolic heart failure Hypertension Type II diabetes -no red flags of syncope or family history of SCD  -unable to hear murmur today -continue metoprolol succinate 75 mg daily -euvolemic today -continue empagliflozin -torsemide PRN -continue candesartan, spironolactone. If BP continues to improve, would cut back on candesartan first -doing well on GLP1RA -heart failure education reviewed today  Morbid obesity: on GLP1RA, see above  Hyperlipidemia: -continue rousvastatin  intermittent atypical chest pain -No suggestion of ischemia or obstruction on prior  stress test.  -counseled on red flag warning signs that need immediate  medical attention -she will call if it becomes stronger, more frequent, or more bothersome without being severe  Nutrition and exercise counseling -recommend heart healthy/Mediterranean diet, with whole grains, fruits, vegetable, fish, lean meats, nuts, and olive oil. Limit salt. -recommend moderate walking, 3-5 times/week for 30-50 minutes each session. Aim for at least 150 minutes.week. Goal should be pace of 3 miles/hours, or walking 1.5 miles in 30 minutes   Not addressed in depth today: Pacemaker: followed by Dr. Sallyanne Kuster  Plan for follow up: 3 months to cut back medicine if weight loss persists.  Buford Dresser, MD, PhD, [facility]   Medication Adjustments/Labs and Tests Ordered: Current medicines are reviewed at length with the patient today.  Concerns regarding medicines are outlined above.   No orders of the defined types were placed in this encounter.  Meds ordered this encounter  Medications   metoprolol succinate (TOPROL-XL) 25 MG 24 hr tablet    Sig: Take 3 tablets (75 mg total) by mouth daily.    Dispense:  270 tablet    Refill:  3   Patient Instructions  Medication Instructions:  The current medical regimen is effective;  continue present plan and medications.   *If you need a refill on your cardiac medications before your next appointment, please call your pharmacy*   Lab Work: None   Testing/Procedures: None   Follow-Up: At [facility], you and your health needs are our priority.  As part of our continuing mission to provide you with exceptional heart care, we have created designated Provider Care Teams.  These Care Teams include your primary Cardiologist (physician) and Advanced Practice Providers (APPs -  Physician Assistants and Nurse Practitioners) who all work together to provide you with the care you need, when you need it.  We recommend  signing up for the patient portal called "MyChart".  Sign up information is provided on this After Visit Summary.  MyChart is used to connect with patients for Virtual Visits (Telemedicine).  Patients are able to view lab/test results, encounter notes, upcoming appointments, etc.  Non-urgent messages can be sent to your provider as well.   To learn more about what you can do with MyChart, go to [email or web address].    Your next appointment:   3 month(s)  Provider:   Buford Dresser, MD    Other Instructions None    I,Coren O'Brien,acting as a scribe for Buford Dresser, MD.,have documented all relevant documentation on the behalf of Buford Dresser, MD,as directed by  Buford Dresser, MD while in the presence of Buford Dresser, MD.  I, Buford Dresser, MD, have reviewed all documentation for this visit. The documentation on 12/26/22 for the exam, diagnosis, procedures, and orders are all accurate and complete.

## 2022-12-26 ENCOUNTER — Encounter (HOSPITAL_BASED_OUTPATIENT_CLINIC_OR_DEPARTMENT_OTHER): Payer: Self-pay | Admitting: Cardiology

## 2022-12-29 DIAGNOSIS — Z7689 Persons encountering health services in other specified circumstances: Secondary | ICD-10-CM | POA: Diagnosis not present

## 2022-12-29 DIAGNOSIS — E782 Mixed hyperlipidemia: Secondary | ICD-10-CM | POA: Diagnosis not present

## 2022-12-29 DIAGNOSIS — E1142 Type 2 diabetes mellitus with diabetic polyneuropathy: Secondary | ICD-10-CM | POA: Diagnosis not present

## 2022-12-29 DIAGNOSIS — B351 Tinea unguium: Secondary | ICD-10-CM | POA: Diagnosis not present

## 2022-12-29 DIAGNOSIS — G4733 Obstructive sleep apnea (adult) (pediatric): Secondary | ICD-10-CM | POA: Diagnosis not present

## 2022-12-29 DIAGNOSIS — I422 Other hypertrophic cardiomyopathy: Secondary | ICD-10-CM | POA: Diagnosis not present

## 2022-12-29 DIAGNOSIS — I509 Heart failure, unspecified: Secondary | ICD-10-CM | POA: Diagnosis not present

## 2023-01-01 ENCOUNTER — Ambulatory Visit (INDEPENDENT_AMBULATORY_CARE_PROVIDER_SITE_OTHER): Payer: 59 | Admitting: Psychiatry

## 2023-01-01 ENCOUNTER — Encounter: Payer: Self-pay | Admitting: Psychiatry

## 2023-01-01 VITALS — BP 110/71 | HR 73 | Ht 66.0 in | Wt 267.0 lb

## 2023-01-01 DIAGNOSIS — G43019 Migraine without aura, intractable, without status migrainosus: Secondary | ICD-10-CM | POA: Diagnosis not present

## 2023-01-01 DIAGNOSIS — G4733 Obstructive sleep apnea (adult) (pediatric): Secondary | ICD-10-CM | POA: Diagnosis not present

## 2023-01-01 NOTE — Progress Notes (Signed)
   CC:  headaches  Follow-up Visit  Last visit: 09/15/22  Brief HPI: 57 year old female with a history of DM2, HLD, OSA, CHF, bradycardia s/p pacemaker, asthma, cervical cancer 2021 who follows in clinic for migraines and neck pain.  At her last visit, CT C-spine was ordered. Amitriptyline was increased and she was started on Nurtec for migraine rescue.  Interval History: Headaches have improved slightly with increased amitriptyline. She has been taking 37.5 mg at bedtime and tolerating this well without side effects. She is still getting 1-2 migraines per week. Nurtec works for rescue.  CT C-spine showed mild encroachment of neural foramina from C4-C7 with no central spinal stenosis.  Migraine days per month: 4 Headache free days per month: 26  Current Headache Regimen: Preventative: amitriptyline 37.5 mg QHS Abortive: Nurtec 75 mg PRN   Prior Therapies                                  Amitriptyline 37.5 mg QHS Candesartan 2 mg daily Metoprolol 25 mg daily Gabapentin 300 mg TID Nurtec 75 mg PRN Neck PT - did not help  Physical Exam:   Vital Signs: BP 110/71   Pulse 73   Ht 5' 6"$  (1.676 m)   Wt 267 lb (121.1 kg)   BMI 43.09 kg/m  GENERAL:  well appearing, in no acute distress, alert  SKIN:  Color, texture, turgor normal. No rashes or lesions HEAD:  Normocephalic/atraumatic. RESP: normal respiratory effort MSK:  No gross joint deformities.   NEUROLOGICAL: Mental Status: Alert, oriented to person, place and time, Follows commands, and Speech fluent and appropriate. Cranial Nerves: PERRL, face symmetric, no dysarthria, hearing grossly intact Motor: moves all extremities equally Gait: normal-based.  IMPRESSION: 57 year old female with a history of DM2, HLD, OSA, CHF, bradycardia s/p pacemaker, asthma, cervical cancer 2021 who presents for follow up of migraines. She has had some improvement with amitriptyline 37.5 mg QHS. Will have her increase to 50 mg QHS and  continue Nurtec for rescue.  PLAN: -Prevention: Increase amitriptyline to 50 mg QHS -Rescue: Continue Nurtec 75 mg PRN -Next steps: consider SNRI, Topamax, CGRP, qulipta for prevention    Follow-up: 8 months  I spent a total of 14 minutes on the date of the service. Headache education was done. Discussed treatment options including preventive and acute medications. Discussed medication side effects, adverse reactions and drug interactions. Written educational materials and patient instructions outlining all of the above were given.  Genia Harold, MD 01/01/23 9:29 AM

## 2023-01-16 ENCOUNTER — Ambulatory Visit (HOSPITAL_BASED_OUTPATIENT_CLINIC_OR_DEPARTMENT_OTHER): Payer: Medicare Other | Admitting: Pulmonary Disease

## 2023-02-02 DIAGNOSIS — I4891 Unspecified atrial fibrillation: Secondary | ICD-10-CM

## 2023-02-02 DIAGNOSIS — G4733 Obstructive sleep apnea (adult) (pediatric): Secondary | ICD-10-CM | POA: Diagnosis not present

## 2023-02-02 HISTORY — DX: Unspecified atrial fibrillation: I48.91

## 2023-02-05 DIAGNOSIS — I5030 Unspecified diastolic (congestive) heart failure: Secondary | ICD-10-CM | POA: Diagnosis not present

## 2023-02-05 DIAGNOSIS — I422 Other hypertrophic cardiomyopathy: Secondary | ICD-10-CM | POA: Diagnosis not present

## 2023-02-05 DIAGNOSIS — R053 Chronic cough: Secondary | ICD-10-CM | POA: Diagnosis not present

## 2023-02-10 ENCOUNTER — Telehealth: Payer: Self-pay

## 2023-02-10 NOTE — Telephone Encounter (Signed)
Alert received from [employer]:  Device alert for atrial noise - known Alert for exceed AT/AF, 1hr event appear AF, burden <1%, no OAC per EPIC - route to triage Presenting regular AS/VS  Sending to physician for review.  Pt is also overdue for in clinic visit.  Sent to scheduler.

## 2023-02-10 NOTE — Telephone Encounter (Signed)
Please schedule for first available device day appt.

## 2023-02-10 NOTE — Telephone Encounter (Signed)
Called pt; first available appt is 02/19/23, she will be out of town. Next available apt 02/23/23 at 1330, scheduled appt.

## 2023-02-23 ENCOUNTER — Ambulatory Visit: Payer: 59 | Admitting: Cardiovascular Disease

## 2023-02-24 ENCOUNTER — Ambulatory Visit (INDEPENDENT_AMBULATORY_CARE_PROVIDER_SITE_OTHER): Payer: 59

## 2023-02-24 DIAGNOSIS — I495 Sick sinus syndrome: Secondary | ICD-10-CM

## 2023-02-25 LAB — CUP PACEART REMOTE DEVICE CHECK
Battery Remaining Longevity: 45 mo
Battery Remaining Percentage: 32 %
Battery Voltage: 2.93 V
Brady Statistic AP VP Percent: 1 %
Brady Statistic AP VS Percent: 4.8 %
Brady Statistic AS VP Percent: 1 %
Brady Statistic AS VS Percent: 94 %
Brady Statistic RA Percent Paced: 4.6 %
Brady Statistic RV Percent Paced: 1 %
Date Time Interrogation Session: 20240423020013
Implantable Lead Connection Status: 753985
Implantable Lead Connection Status: 753985
Implantable Lead Implant Date: 20160913
Implantable Lead Implant Date: 20160913
Implantable Lead Location: 753859
Implantable Lead Location: 753860
Implantable Lead Model: 1948
Implantable Pulse Generator Implant Date: 20160913
Lead Channel Impedance Value: 360 Ohm
Lead Channel Impedance Value: 600 Ohm
Lead Channel Pacing Threshold Amplitude: 0.625 V
Lead Channel Pacing Threshold Amplitude: 1 V
Lead Channel Pacing Threshold Pulse Width: 0.4 ms
Lead Channel Pacing Threshold Pulse Width: 0.4 ms
Lead Channel Sensing Intrinsic Amplitude: 2.6 mV
Lead Channel Sensing Intrinsic Amplitude: 3.1 mV
Lead Channel Setting Pacing Amplitude: 1.25 V
Lead Channel Setting Pacing Amplitude: 1.625
Lead Channel Setting Pacing Pulse Width: 0.4 ms
Lead Channel Setting Sensing Sensitivity: 0.7 mV
Pulse Gen Model: 2240
Pulse Gen Serial Number: 7676319

## 2023-03-04 DIAGNOSIS — G4733 Obstructive sleep apnea (adult) (pediatric): Secondary | ICD-10-CM | POA: Diagnosis not present

## 2023-03-05 ENCOUNTER — Encounter: Payer: Self-pay | Admitting: Cardiovascular Disease

## 2023-03-05 ENCOUNTER — Ambulatory Visit: Payer: 59 | Attending: Cardiovascular Disease | Admitting: Cardiovascular Disease

## 2023-03-05 VITALS — BP 118/70 | HR 62 | Ht 67.0 in | Wt 258.2 lb

## 2023-03-05 DIAGNOSIS — Z950 Presence of cardiac pacemaker: Secondary | ICD-10-CM

## 2023-03-05 DIAGNOSIS — T82110D Breakdown (mechanical) of cardiac electrode, subsequent encounter: Secondary | ICD-10-CM

## 2023-03-05 DIAGNOSIS — I495 Sick sinus syndrome: Secondary | ICD-10-CM | POA: Diagnosis not present

## 2023-03-05 DIAGNOSIS — I480 Paroxysmal atrial fibrillation: Secondary | ICD-10-CM | POA: Diagnosis not present

## 2023-03-05 DIAGNOSIS — I422 Other hypertrophic cardiomyopathy: Secondary | ICD-10-CM

## 2023-03-05 DIAGNOSIS — F4321 Adjustment disorder with depressed mood: Secondary | ICD-10-CM

## 2023-03-05 MED ORDER — APIXABAN 5 MG PO TABS: 5.0000 mg | ORAL_TABLET | Freq: Two times a day (BID) | ORAL | 3 refills | Status: DC

## 2023-03-05 NOTE — Patient Instructions (Signed)
Medication Instructions:  Eliquis 5mg  two times a day *If you need a refill on your cardiac medications before your next appointment, please call your pharmacy*  Follow-Up: At [facility], you and your health needs are our priority.  As part of our continuing mission to provide you with exceptional heart care, we have created designated Provider Care Teams.  These Care Teams include your primary Cardiologist (physician) and Advanced Practice Providers (APPs -  Physician Assistants and Nurse Practitioners) who all work together to provide you with the care you need, when you need it.  We recommend signing up for the patient portal called "MyChart".  Sign up information is provided on this After Visit Summary.  MyChart is used to connect with patients for Virtual Visits (Telemedicine).  Patients are able to view lab/test results, encounter notes, upcoming appointments, etc.  Non-urgent messages can be sent to your provider as well.   To learn more about what you can do with MyChart, go to [email or web address].    Your next appointment:   1 year(s)  Provider:   Dr Royann Shivers

## 2023-03-05 NOTE — Progress Notes (Signed)
Cardiology Office Note:    Date:  03/05/2023   ID:  Sophia Wilkinson, DOB 31-May-1966, MRN [id number]  PCP:  Sophia Hazel, MD  Cardiologist:  Sophia Red, MD  Electrophysiologist:  None (pacemaker - Sophia Wilkinson)  Referring MD: Sophia Hazel, MD   No chief complaint on file.   History of Present Illness:    Sophia Wilkinson is a 57 y.o. 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 She has established cardiology follow-up with Sophia Wilkinson.  Asked her to come in because of an episode of rapid atrial and ventricular rates.  On February 10, 2023 at 4 AM she had a 1 hour and 4-minute episode with atrial rate up to 600 bpm and rapid ventricular response at 130 bpm, consistent with atrial fibrillation.  This is very different from her previous episodes of "oversensing noise" which would last for just a few seconds at a time, with atrial rates typically in the 200s-300s and normal ventricular rates around 70s.  Unfortunately the EGM is over-written due to her frequent episodes of atrial lead oversensing.  She feels better when compared with her previous appointment.  She has not had any syncope or near syncope.  She denies angina, dyspnea at rest, orthopnea, PND or edema.  She continues to complain of fatigue.  She denies palpitations.  Dual-chamber [employer] Assurity DR device was implanted in September 2016 at [facility] and has roughly 10-11 years of anticipated longevity.  She has 43% atrial pacing and only 2.7% ventricular pacing.  Somewhat fewer episodes of "noise" are seen on her atrial lead with episodes of false atrial mode switch.  In addition she also has occasional true paroxysmal atrial tachycardia but this is always brief.  Compared to her previous visit she does not have any  episodes of loss of capture with her current device settings.. As before the atrial lead capture threshold and sensing are otherwise normal with P waves around 2 mV, but the impedance is relatively low around 300 ohms.  The ventricular lead is functioning normally (impedance 560 ohms, R waves 4.7 mV, threshold 0.875 V at 0.4 ms).  There are no meaningful  episodes of high ventricular rate.  She had a normal stress echo on July 07, 2018.  There was no inducible outflow tract gradient.  There were no regional wall motion abnormalities at peak exercise.  She was able to exercise for 6 minutes, 7 METS.  Past Medical History:  Diagnosis Date   Allergy    Anemia    Blood transfusion without reported diagnosis    Bradycardia    Cancer (HCC)    Cardiomyopathy (HCC)    CHF (congestive heart failure) (HCC)    Chronic pain    Diabetes mellitus without complication (HCC)    GERD (gastroesophageal reflux disease)    Heart murmur    Hyperlipidemia    Migraines    Sleep apnea     Past Surgical History:  Procedure Laterality Date   ABDOMINAL HYSTERECTOMY  2016   HERNIA REPAIR     LAPAROSCOPY ABDOMEN DIAGNOSTIC     LEEP     PACEMAKER IMPLANT  2016   TUBAL LIGATION  1999    Current Medications: Current Meds  Medication Sig   ACCU-CHEK GUIDE test strip USE 1 ONCE DAILY BEFORE BREAKFAST   Accu-Chek Softclix Lancets lancets  SMARTSIG:1 Topical Daily   Acetaminophen 500 MG capsule Take 500 mg by mouth as needed.   albuterol (VENTOLIN HFA) 108 (90 Base) MCG/ACT inhaler Inhale 2 puffs into the lungs every 4 (four) hours as needed for wheezing or shortness of breath.   amitriptyline (ELAVIL) 25 MG tablet Take 1.5 pills at bedtime for one week, then increase to 2 pills at bedtime   apixaban (ELIQUIS) 5 MG TABS tablet Take 1 tablet (5 mg total) by mouth 2 (two) times daily.   candesartan (ATACAND) 4 MG tablet Take 1/2 (one-half) tablet by mouth once daily   chlorpheniramine (CHLOR-TRIMETON) 4 MG  tablet Take 4 mg by mouth 2 (two) times daily as needed for allergies.   diclofenac Sodium (VOLTAREN) 1 % GEL Apply 2 g topically as needed.   famotidine (PEPCID) 20 MG tablet Take 20 mg by mouth daily.   fluticasone (FLONASE) 50 MCG/ACT nasal spray Place into both nostrils daily.   fluticasone-salmeterol (ADVAIR) 250-50 MCG/ACT AEPB Inhale 1 puff into the lungs in the morning and at bedtime.   gabapentin (NEURONTIN) 300 MG capsule Take 300 mg by mouth 3 (three) times daily.   metoprolol succinate (TOPROL-XL) 25 MG 24 hr tablet Take 3 tablets (75 mg total) by mouth daily.   montelukast (SINGULAIR) 10 MG tablet Take 10 mg by mouth at bedtime.   MOUNJARO 7.5 MG/0.5ML Pen Inject 7.5 mg into the skin once a week.   Multiple Vitamin (MULTIVITAMIN WITH MINERALS) TABS tablet Take 1 tablet by mouth daily.   potassium chloride (KLOR-CON) 10 MEQ tablet Take 10 mEq by mouth as needed (with the fluid pill).   pseudoephedrine (SUDAFED) 60 MG tablet Take 1 tablet (60 mg total) by mouth every 6 (six) hours as needed for congestion.   Rimegepant Sulfate (NURTEC) 75 MG TBDP Take 75 mg by mouth as needed (for migraine). Max dose 1 pill in 24 hours   rosuvastatin (CRESTOR) 20 MG tablet Take 20 mg by mouth daily.   senna-docusate (SENOKOT-S) 8.6-50 MG tablet Take 1 tablet by mouth every morning.   spironolactone (ALDACTONE) 25 MG tablet Take 1/2 (one-half) tablet by mouth once daily   torsemide (DEMADEX) 10 MG tablet Take 1 tablet (10 mg total) by mouth daily as needed (weight gain 3 lbs overnight or 5 lbs in a week).   Vitamin D, Ergocalciferol, (DRISDOL) 1.25 MG (50000 UNIT) CAPS capsule Take 50,000 Units by mouth once a week.     Allergies:   Metformin   Social History   Socioeconomic History   Marital status: Single    Spouse name: Not on file   Number of children: 5   Years of education: 4 year college   Highest education level: Bachelor's degree (e.g., BA, AB, BS)  Occupational History   Not on file   Tobacco Use   Smoking status: Never    Passive exposure: Past   Smokeless tobacco: Never  Vaping Use   Vaping Use: Never used  Substance and Sexual Activity   Alcohol use: Never   Drug use: Never   Sexual activity: Not Currently    Birth control/protection: Surgical  Other Topics Concern   Not on file  Social History Narrative   Lives with daughter and two grandkids in a manufactured home. Has steps into home (3). No pets. Likes to spend time with grandkids, reading, computer. Has smoke alarms, no throw rugs, no cords across floors. No grab bars.   Social Determinants of Health   Financial Resource Strain: Low  Risk  (07/14/2018)   Overall Financial Resource Strain (CARDIA)    Difficulty of Paying Living Expenses: Not hard at all  Food Insecurity: No Food Insecurity (05/01/2021)   Hunger Vital Sign    Worried About Running Out of Food in the Last Year: Never true    Ran Out of Food in the Last Year: Never true  Transportation Needs: No Transportation Needs (07/14/2018)   PRAPARE - Administrator, Civil Service (Medical): No    Lack of Transportation (Non-Medical): No  Physical Activity: Sufficiently Active (07/14/2018)   Exercise Vital Sign    Days of Exercise per Week: 7 days    Minutes of Exercise per Session: 60 min  Stress: Stress Concern Present (07/14/2018)   [employer] of Occupational Health - Occupational Stress Questionnaire    Feeling of Stress : To some extent  Social Connections: Moderately Integrated (07/14/2018)   Social Connection and Isolation Panel [NHANES]    Frequency of Communication with Friends and Family: More than three times a week    Frequency of Social Gatherings with Friends and Family: More than three times a week    Attends Religious Services: More than 4 times per year    Active Member of [employer] or Organizations: Yes    Attends Banker Meetings: 1 to 4 times per year    Marital Status: Never married     Family  History: The patient's family history includes Alcohol abuse in her father, mother, and sister; Diabetes in her daughter, maternal grandmother, and sister; Drug abuse in her father; Heart disease in her maternal grandmother and paternal grandmother; Hyperlipidemia in her maternal grandmother; Hypertension in her daughter; Liver disease in her mother. There is no history of Colon cancer, Esophageal cancer, Rectal cancer, or Stomach cancer.  ROS:   Please see the history of present illness.     All other systems reviewed and are negative.  EKGs/Labs/Other Studies Reviewed:    The following studies were reviewed today: Stress echo, notes from Sophia Wilkinson  EKG:  EKG is ordered today.  It shows atrial paced, ventricular sensed rhythm.  There are prominent repolarization abnormalities in multiple leads with a pattern strongly suggesting secondary repull changes due to LVH; suspect obesity is masking the voltage for left ventricular hypertrophy  Recent Labs: 05/25/2022: BUN 8; Creatinine, Ser 0.73; Hemoglobin 11.9; Platelets 274; Potassium 3.9; Sodium 143  Recent Lipid Panel    Component Value Date/Time   CHOL 204 (H) 06/06/2020 1000   TRIG 77 06/06/2020 1000   HDL 39 (L) 06/06/2020 1000   CHOLHDL 5.2 (H) 06/06/2020 1000   LDLCALC 151 (H) 06/06/2020 1000    Physical Exam:    VS:  BP 118/70 (BP Location: Left Arm, Patient Position: Sitting, Cuff Size: Large)   Pulse 62   Ht 5\' 7"  (1.702 m)   Wt 258 lb 3.2 oz (117.1 kg)   SpO2 95%   BMI 40.44 kg/m     Wt Readings from Last 3 Encounters:  03/05/23 258 lb 3.2 oz (117.1 kg)  01/01/23 267 lb (121.1 kg)  12/25/22 270 lb (122.5 kg)     General: Alert, oriented x3, no distress, morbidly obese.  Healthy subclavian pacemaker site. Head: no evidence of trauma, PERRL, EOMI, no exophtalmos or lid lag, no myxedema, no xanthelasma; normal ears, nose and oropharynx Neck: normal jugular venous pulsations and no hepatojugular reflux; brisk  carotid pulses without delay and no carotid bruits Chest: clear to auscultation, no signs of  consolidation by percussion or palpation, normal fremitus, symmetrical and full respiratory excursions Cardiovascular: normal position and quality of the apical impulse, regular rhythm, normal first and second heart sounds, no murmurs, rubs or gallops Abdomen: no tenderness or distention, no masses by palpation, no abnormal pulsatility or arterial bruits, normal bowel sounds, no hepatosplenomegaly Extremities: no clubbing, cyanosis or edema; 2+ radial, ulnar and brachial pulses bilaterally; 2+ right femoral, posterior tibial and dorsalis pedis pulses; 2+ left femoral, posterior tibial and dorsalis pedis pulses; no subclavian or femoral bruits Neurological: grossly nonfocal Psych: Normal mood and affect  ASSESSMENT:    1. Paroxysmal atrial fibrillation (HCC)   2. SSS (sick sinus syndrome) (HCC)   3. Hypertrophic cardiomyopathy (HCC)   4. Pacemaker   5. Pacemaker lead malfunction, subsequent encounter   6. Morbid obesity (HCC)   7. Grief     PLAN:    In order of problems listed above:  AFib: Although she has had episodes of "noise" over sensing on the atrial channel, the episode occurred in April and was clearly different in duration and based on atrial and ventricular rates.  Can all be explained by atrial fibrillation.  CHA2DS2-VASc score 4 (gender, HTN).  Will start Eliquis 5 mg twice daily.  Discussed the risk/benefit balance of this medication.  She has not had any serious bleeding problems.  Not prone to falls.   SSS: Not pacemaker dependent.  Rarely requires pacing. HCM: Without gradient at rest or inducible with exercise.  No evidence of significant ventricular arrhythmias on pacemaker monitoring. PPM: She has minor atrial lead malfunction, possibly a small insulation breach.  After the adjustment in her outputs she has not had any new episodes of loss of atrial capture but she continues to  have intermittent brief episodes of atrial oversensing due to noise.  There is really no way to program around this.  It is not a serious medical problem to justify lead revision at this time.  The major problem is the multiple recordings of artifact that may over right important finding.  When she gets a new pacemaker generator she should probably receive a new atrial lead. Morbid obesity: She has made substantial progress with weight loss this year.  On Mounjaro.  Encouraged her to continue trying. Grief: She lost her daughter couple of weeks before the episode of atrial fibrillation; it is likely that the hyperadrenergic state related to this contributed to the onset of atrial fibrillation.  Medication Adjustments/Labs and Tests Ordered: Current medicines are reviewed at length with the patient today.  Concerns regarding medicines are outlined above.  Orders Placed This Encounter  Procedures   EKG 12-Lead   Meds ordered this encounter  Medications   apixaban (ELIQUIS) 5 MG TABS tablet    Sig: Take 1 tablet (5 mg total) by mouth 2 (two) times daily.    Dispense:  180 tablet    Refill:  3    Patient Instructions  Medication Instructions:  Eliquis 5mg  two times a day *If you need a refill on your cardiac medications before your next appointment, please call your pharmacy*  Follow-Up: At [facility], you and your health needs are our priority.  As part of our continuing mission to provide you with exceptional heart care, we have created designated Provider Care Teams.  These Care Teams include your primary Cardiologist (physician) and Advanced Practice Providers (APPs -  Physician Assistants and Nurse Practitioners) who all work together to provide you with the care you need, when you  need it.  We recommend signing up for the patient portal called "MyChart".  Sign up information is provided on this After Visit Summary.  MyChart is used to connect with patients for Virtual Visits  (Telemedicine).  Patients are able to view lab/test results, encounter notes, upcoming appointments, etc.  Non-urgent messages can be sent to your provider as well.   To learn more about what you can do with MyChart, go to [email or web address].    Your next appointment:   1 year(s)  Provider:   Dr Sophia Wilkinson    Signed, Thurmon Fair, MD  03/05/2023 4:46 PM    [facility]

## 2023-03-06 DIAGNOSIS — I480 Paroxysmal atrial fibrillation: Secondary | ICD-10-CM | POA: Diagnosis not present

## 2023-03-06 DIAGNOSIS — R053 Chronic cough: Secondary | ICD-10-CM | POA: Diagnosis not present

## 2023-03-11 DIAGNOSIS — E782 Mixed hyperlipidemia: Secondary | ICD-10-CM | POA: Diagnosis not present

## 2023-03-11 DIAGNOSIS — E1142 Type 2 diabetes mellitus with diabetic polyneuropathy: Secondary | ICD-10-CM | POA: Diagnosis not present

## 2023-03-11 DIAGNOSIS — G4733 Obstructive sleep apnea (adult) (pediatric): Secondary | ICD-10-CM | POA: Diagnosis not present

## 2023-03-11 DIAGNOSIS — I421 Obstructive hypertrophic cardiomyopathy: Secondary | ICD-10-CM | POA: Diagnosis not present

## 2023-03-11 DIAGNOSIS — E559 Vitamin D deficiency, unspecified: Secondary | ICD-10-CM | POA: Diagnosis not present

## 2023-03-12 ENCOUNTER — Other Ambulatory Visit (HOSPITAL_BASED_OUTPATIENT_CLINIC_OR_DEPARTMENT_OTHER): Payer: Self-pay

## 2023-03-12 ENCOUNTER — Other Ambulatory Visit (HOSPITAL_COMMUNITY): Payer: Self-pay

## 2023-03-12 MED ORDER — MOUNJARO 10 MG/0.5ML ~~LOC~~ SOAJ
10.0000 mg | SUBCUTANEOUS | 0 refills | Status: DC
  Filled 2023-03-12: qty 2, 28d supply, fill #0

## 2023-03-19 DIAGNOSIS — G4733 Obstructive sleep apnea (adult) (pediatric): Secondary | ICD-10-CM | POA: Diagnosis not present

## 2023-03-24 NOTE — Progress Notes (Signed)
Remote pacemaker transmission.   

## 2023-04-01 ENCOUNTER — Encounter (HOSPITAL_BASED_OUTPATIENT_CLINIC_OR_DEPARTMENT_OTHER): Payer: Self-pay | Admitting: Cardiology

## 2023-04-01 ENCOUNTER — Ambulatory Visit (INDEPENDENT_AMBULATORY_CARE_PROVIDER_SITE_OTHER): Payer: 59 | Admitting: Cardiology

## 2023-04-01 VITALS — BP 112/73 | HR 69 | Ht 67.0 in | Wt 253.2 lb

## 2023-04-01 DIAGNOSIS — I480 Paroxysmal atrial fibrillation: Secondary | ICD-10-CM | POA: Diagnosis not present

## 2023-04-01 DIAGNOSIS — I5032 Chronic diastolic (congestive) heart failure: Secondary | ICD-10-CM

## 2023-04-01 DIAGNOSIS — I10 Essential (primary) hypertension: Secondary | ICD-10-CM | POA: Diagnosis not present

## 2023-04-01 DIAGNOSIS — E119 Type 2 diabetes mellitus without complications: Secondary | ICD-10-CM | POA: Diagnosis not present

## 2023-04-01 DIAGNOSIS — I422 Other hypertrophic cardiomyopathy: Secondary | ICD-10-CM

## 2023-04-01 DIAGNOSIS — F439 Reaction to severe stress, unspecified: Secondary | ICD-10-CM | POA: Diagnosis not present

## 2023-04-01 DIAGNOSIS — G4733 Obstructive sleep apnea (adult) (pediatric): Secondary | ICD-10-CM | POA: Diagnosis not present

## 2023-04-01 DIAGNOSIS — Z7985 Long-term (current) use of injectable non-insulin antidiabetic drugs: Secondary | ICD-10-CM | POA: Diagnosis not present

## 2023-04-01 NOTE — Progress Notes (Signed)
Cardiology Office Note:    Date:  04/01/2023   ID:  Sophia Wilkinson, DOB 1966/03/24, MRN [id number]  PCP:  Sophia Hazel, MD  Cardiologist:  Sophia Red, MD PhD Pacemaker: Dr. Royann Shivers  Referring MD: Sophia Hazel, MD   CC: follow up  History of Present Illness:    Sophia Wilkinson is a 57 y.o. female with a hx of hypertrophic cardiomyopathy, type II diabetes, hypertension, hyperlipidemia who is seen in follow-up today. She was initially seen as a new consult at the request of Dr. Linwood Dibbles for evaluation and management of heart failure on 06/18/18.   She has a past medical history of hypertrophic cardiomyopathy without significant obstruction, chronic diastolic heart failure, morbid obesity, obstructive sleep apnea on CPAP, and bradycardia s/p PPM. She had been followed by [facility] until moving to [facility] 11/2017. She denies family history of sudden cardiac death. No syncope. No known history of NSVT/VT. Last [facility] note summarized in initial consult note.  Today: Lost her 42 year old daughter in March, now has custody of her grandkids. Has been very stressed. Has some support from her church family in [place], but legal costs are expected to be 5000/child just to start. Currently taking care of 6 kids age 71 to 18 mos. Other daughter who was helping her has been sick for 2 mos.  Recently diagnosed with afib based on her pacemaker interrogation. Discussed stroke risk. With her daughter's history of stroke she is concerned about this.   Past Medical History:  Diagnosis Date   Allergy    Anemia    Blood transfusion without reported diagnosis    Bradycardia    Cancer (HCC)    Cardiomyopathy (HCC)    CHF (congestive heart failure) (HCC)    Chronic pain    Diabetes mellitus without complication (HCC)    GERD (gastroesophageal reflux disease)    Heart murmur    Hyperlipidemia    Migraines    Sleep apnea     Past Surgical History:  Procedure Laterality Date   ABDOMINAL  HYSTERECTOMY  2016   HERNIA REPAIR     LAPAROSCOPY ABDOMEN DIAGNOSTIC     LEEP     PACEMAKER IMPLANT  2016   TUBAL LIGATION  1999    Current Medications: Current Outpatient Medications on File Prior to Visit  Medication Sig   ACCU-CHEK GUIDE test strip USE 1 ONCE DAILY BEFORE BREAKFAST   Accu-Chek Softclix Lancets lancets SMARTSIG:1 Topical Daily   acetaminophen (TYLENOL) 650 MG CR tablet Take 650 mg by mouth 2 (two) times daily. (Patient not taking: Reported on 03/05/2023)   Acetaminophen 500 MG capsule Take 500 mg by mouth as needed.   albuterol (VENTOLIN HFA) 108 (90 Base) MCG/ACT inhaler Inhale 2 puffs into the lungs every 4 (four) hours as needed for wheezing or shortness of breath.   amitriptyline (ELAVIL) 25 MG tablet Take 1.5 pills at bedtime for one week, then increase to 2 pills at bedtime   apixaban (ELIQUIS) 5 MG TABS tablet Take 1 tablet (5 mg total) by mouth 2 (two) times daily.   candesartan (ATACAND) 4 MG tablet Take 1/2 (one-half) tablet by mouth once daily   chlorpheniramine (CHLOR-TRIMETON) 4 MG tablet Take 4 mg by mouth 2 (two) times daily as needed for allergies.   diclofenac Sodium (VOLTAREN) 1 % GEL Apply 2 g topically as needed.   famotidine (PEPCID) 20 MG tablet Take 20 mg by mouth daily.   fluticasone (FLONASE) 50 MCG/ACT nasal spray Place into  both nostrils daily.   fluticasone-salmeterol (ADVAIR) 250-50 MCG/ACT AEPB Inhale 1 puff into the lungs in the morning and at bedtime.   gabapentin (NEURONTIN) 300 MG capsule Take 300 mg by mouth 3 (three) times daily.   metoprolol succinate (TOPROL-XL) 25 MG 24 hr tablet Take 3 tablets (75 mg total) by mouth daily.   montelukast (SINGULAIR) 10 MG tablet Take 10 mg by mouth at bedtime.   MOUNJARO 7.5 MG/0.5ML Pen Inject 7.5 mg into the skin once a week.   Multiple Vitamin (MULTIVITAMIN WITH MINERALS) TABS tablet Take 1 tablet by mouth daily.   potassium chloride (KLOR-CON) 10 MEQ tablet Take 10 mEq by mouth as needed (with  the fluid pill).   pseudoephedrine (SUDAFED) 60 MG tablet Take 1 tablet (60 mg total) by mouth every 6 (six) hours as needed for congestion.   Rimegepant Sulfate (NURTEC) 75 MG TBDP Take 75 mg by mouth as needed (for migraine). Max dose 1 pill in 24 hours   rosuvastatin (CRESTOR) 20 MG tablet Take 20 mg by mouth daily.   senna-docusate (SENOKOT-S) 8.6-50 MG tablet Take 1 tablet by mouth every morning.   spironolactone (ALDACTONE) 25 MG tablet Take 1/2 (one-half) tablet by mouth once daily   tirzepatide (MOUNJARO) 10 MG/0.5ML Pen Inject 10 mg into the skin once a week.   torsemide (DEMADEX) 10 MG tablet Take 1 tablet (10 mg total) by mouth daily as needed (weight gain 3 lbs overnight or 5 lbs in a week).   Vitamin D, Ergocalciferol, (DRISDOL) 1.25 MG (50000 UNIT) CAPS capsule Take 50,000 Units by mouth once a week.   No current facility-administered medications on file prior to visit.     Allergies:   Metformin   Social History   Tobacco Use   Smoking status: Never    Passive exposure: Past   Smokeless tobacco: Never  Vaping Use   Vaping Use: Never used  Substance Use Topics   Alcohol use: Never   Drug use: Never    Family History: The patient's family history includes Alcohol abuse in her father, mother, and sister; Diabetes in her daughter, maternal grandmother, and sister; Drug abuse in her father; Heart disease in her maternal grandmother and paternal grandmother; Hyperlipidemia in her maternal grandmother; Hypertension in her daughter; Liver disease in her mother. There is no history of Colon cancer, Esophageal cancer, Rectal cancer, or Stomach cancer.  Mother passed away age 75 from liver disease. Father passed away at age 53 from overdose. Grandparents passed in their 47s from cancer, MS, and heart disease. Brother living at age 16.   ROS:   Please see the history of present illness. (+) Chest Pressure All other systems are reviewed and negative.    EKGs/Labs/Other Studies  Reviewed:    The following studies were reviewed today: Notes through Care Everywhere in initial consult note.   Echo Stress 07/07/18 Study Conclusions  - Stress ECG conclusions: The stress ECG was normal. This score   predicts a low risk of cardiac events. - Impressions: Normal stress echo   Impressions:  - Normal study after maximal exercise. Normal stress echo  HCM treadmill stress echo 01/09/2017: CONCLUSIONS  Non-obstructive HCM.   Reduced exercise capacity for age (7.2 METs, 90% of age predicted).   Appropriate blood pressure response to exertion.   No evidence of exercise induced ischemia by echocardiography at a sub- adequate heart rate. Please note that the HCM echocardiogram is focused  on the assessment of hemodynamics (peak outflow tract gradient) at  peak  stress rather than wall motion analysis. Wall motion images are collected  later after termination of stress and thus it is likely that ischemic findings   will be missed at lower heart rates. Inducible ischemia at higher workloads cannot be ruled out.  While no wall motion abnormalities were noted on  this study, this study was performed to evaluate LV outflow obstruction  with exercise rather than being optimized to evaluate for stress induced  wall motion.    Calculated gradients assume an LA pressure of 10 mmHg. Gradients may  be underestimated if the true peak MR velocity is not captured. Direct  gradients may be overestimated if the Doppler signal is contaminated.  Treadmill Results  Protocol:           Bruce                   Duration (min:sec):06:11   METS:  Stage:                Amyl Nitrate      BP: (mmHg)   HR:  Comments:        Max HR: 131      % MPHR     77                        (mL inhaled)    Sys         Diast(bpm)    Baseline                            111     52     65                                      151     77     75  2                                   168     74     131  Peak                                                131  1 min recovery                      135     66     100  Recovery                            117     46     77  Recovery                            110     50     72   Reason for Termination:Dyspnea  Symptoms:      Complained of shortness of breath and appeared dyspneic throughout exercise.  Recovery Time (min:sec):05:33   FINDINGS   EKG FINDINGS  Baseline:  Normal sinus rhythm, nonspecific T-wave changes.   Peak:  Sinus tachycardia  with occasional PVCs, no ischemic EKG changes.   METS:7.20  Hyperdynamic LV systolic function, LVEF estimated at 75-80%. All LV  walls augment appropriately.  Near LV cavity obliteration. Peak LVOT  gradient with exercise of 14 mmHg.  Chordal SAM.  Recovery:  Normal sinus rhythm with frequent PVCs and ventricular couplet in early  recovery, no ischemic EKG changes.  Hyperdynamic LV systolic function, LVEF estimated at 70%.  No regional  wall motion abnormalities. Chordal SAM.   Echo 01/09/2017 CONCLUSIONS   Normal left ventricular cavity size. Severe asymetric left ventricular   hypertropy (septum 2.4 cm, posterior wall 1.1 cm).  Increased   (hyperdynamic) left ventricular ejection fraction. LVEF estimated at   70-75%.  No regional wall motion abnormalities.  Normal diastolic   filling pattern for age.     Normal right ventricular size. Normal right ventricular global    systolic function.    Systolic anterior motion of the mitral valve chordae. No significant   LVOT gradient at rest or with Valsalva (< 10 mmHg); post-PVC   gradient 14 mmHg.     No significant valve dysfunction.   EKG:  EKG is personally reviewed today.   12/25/2022: not ordered today 12/27/2021: NSR at 64 bpm 06/25/2021: ECG not ordered 12/27/2020: sinus bradycardia at 54 bpm, unchanged T wave pattern  Recent Labs: 05/25/2022: BUN 8; Creatinine, Ser 0.73; Hemoglobin 11.9; Platelets 274; Potassium 3.9; Sodium 143   Recent Lipid Panel    Component Value  Date/Time   CHOL 204 (H) 06/06/2020 1000   TRIG 77 06/06/2020 1000   HDL 39 (L) 06/06/2020 1000   CHOLHDL 5.2 (H) 06/06/2020 1000   LDLCALC 151 (H) 06/06/2020 1000    Physical Exam:    VS:  BP 112/73 (BP Location: Left Arm, Patient Position: Sitting, Cuff Size: Large)   Pulse 69   Ht 5\' 7"  (1.702 m)   Wt 253 lb 3.2 oz (114.9 kg)   SpO2 100%   BMI 39.66 kg/m     Wt Readings from Last 3 Encounters:  03/05/23 258 lb 3.2 oz (117.1 kg)  01/01/23 267 lb (121.1 kg)  12/25/22 270 lb (122.5 kg)   GEN: Well nourished, well developed in no acute distress HEENT: Normal, moist mucous membranes NECK: No JVD CARDIAC: regular rhythm, normal S1 and S2, no rubs or gallops. No murmur at rest or with valsalva VASCULAR: Radial and DP pulses 2+ bilaterally. No carotid bruits RESPIRATORY:  Clear to auscultation without rales, wheezing or rhonchi  ABDOMEN: Soft, non-tender, non-distended MUSCULOSKELETAL:  Ambulates independently SKIN: Warm and dry, no edema NEUROLOGIC:  Alert and oriented x 3. No focal neuro deficits noted. PSYCHIATRIC:  Normal affect    ASSESSMENT:    1. Stress   2. Paroxysmal atrial fibrillation (HCC)   3. Hypertrophic cardiomyopathy (HCC)   4. Chronic diastolic congestive heart failure (HCC)   5. Type 2 diabetes mellitus without complication, without long-term current use of insulin (HCC)   6. Essential hypertension   7. Morbid obesity (HCC)      PLAN:    Stress: under severe family stress currently. Will ask if our social worker has ideas on how we can support her.  Paroxysmal atrial fibrillation -with HOCM, needs anticoagulation, tolerating well  Hypertrophic cardiomyopathy Chronic diastolic heart failure Hypertension Type II diabetes -no Wilkinson flags of syncope or family history of SCD  -unable to hear murmur today -continue metoprolol succinate 75 mg daily -euvolemic today -continue empagliflozin -torsemide PRN -continue candesartan, spironolactone. If BP  continues to improve, would cut back on candesartan first -doing well on GLP1RA -heart failure education reviewed today  Morbid obesity: on GLP1RA, see above  Hyperlipidemia: -continue rousvastatin  intermittent atypical chest pain -No suggestion of ischemia or obstruction on prior stress test.  -counseled on Wilkinson flag warning signs that need immediate medical attention -she will call if it becomes stronger, more frequent, or more bothersome without being severe  Nutrition and exercise counseling -recommend heart healthy/Mediterranean diet, with whole grains, fruits, vegetable, fish, lean meats, nuts, and olive oil. Limit salt. -recommend moderate walking, 3-5 times/week for 30-50 minutes each session. Aim for at least 150 minutes.week. Goal should be pace of 3 miles/hours, or walking 1.5 miles in 30 minutes   Not addressed in depth today: Pacemaker: followed by Dr. Royann Shivers  Plan for follow up: 6 months   Sophia Red, MD, PhD, [facility]  [facility]

## 2023-04-01 NOTE — Patient Instructions (Signed)
Medication Instructions:  Your physician recommends that you continue on your current medications as directed. Please refer to the Current Medication list given to you today.  *If you need a refill on your cardiac medications before your next appointment, please call your pharmacy*  Lab Work: NONE  Testing/Procedures: NONE  Follow-Up: At [facility], you and your health needs are our priority.  As part of our continuing mission to provide you with exceptional heart care, we have created designated Provider Care Teams.  These Care Teams include your primary Cardiologist (physician) and Advanced Practice Providers (APPs -  Physician Assistants and Nurse Practitioners) who all work together to provide you with the care you need, when you need it.  We recommend signing up for the patient portal called "MyChart".  Sign up information is provided on this After Visit Summary.  MyChart is used to connect with patients for Virtual Visits (Telemedicine).  Patients are able to view lab/test results, encounter notes, upcoming appointments, etc.  Non-urgent messages can be sent to your provider as well.   To learn more about what you can do with MyChart, go to [email or web address].    Your next appointment:   09/25/2023 8:20 am with Jodelle Red, MD   Referral for social worker to reach out

## 2023-04-02 ENCOUNTER — Telehealth (HOSPITAL_BASED_OUTPATIENT_CLINIC_OR_DEPARTMENT_OTHER): Payer: Self-pay | Admitting: Licensed Clinical Social Worker

## 2023-04-02 ENCOUNTER — Encounter (HOSPITAL_BASED_OUTPATIENT_CLINIC_OR_DEPARTMENT_OTHER): Payer: Self-pay

## 2023-04-02 NOTE — Progress Notes (Signed)
Heart and Vascular Care Navigation  04/02/2023  Sophia Wilkinson May 01, 1966 [id number]  Reason for Referral: "financial challenges" Patient is participating in a Managed Medicaid Plan: No, [facility] Medicare Engaged with patient by telephone Engaged with patient by telephone for initial visit for Heart and Vascular Care Coordination.                                                                                                   Assessment:                   LCSW was able to reach pt this morning at [phone]. Introduced self, role, reason for call. Pt confirmed home address, PCP, and emergency contact remains her daughter Zella Ball who lives with pt. Pt currently resides with her daughter and 6/7 grandchildren. Pt shares that she and her daughter are able to make ends meet with the assistance of SNAP, Medicaid, SSDI and her daughters income. She has access to transportation. She has 5 children in total- 4 daughters (one deceased) and a son (also deceased). Two children live out of state and one with her. She moved here from [place]. She tries to provide her grandchildren with stability and routine as best she can.   Most of her concern/financial worry revolves around cost of legal expenses as she is trying to ensure that she has custody formally of all her grandchildren from [place]. Their mother passed and from what I can gather she has a form of caregiver rights but no formal custody. She spoke with a lawyer who had previously assisted and they shared that it would be expensive (roughly $5000). She is worried if she does not do that, that their father figures can come back into the picture at any time. Children also are entitled to SSI survivor benefits and she would like clarification on that/to be able to provide them with those. She has consulted DSS and [facility] but wasn't able to find formal legal assistance. I recommended she reach out to [employer] and [employer] groups. I thanked pt for the environment that she has provided for her grandchildren and the stability that she has worked hard to create. I encouraged her to stay in touch with [place] should the resources we send not be what she needs and we can do our best to find alternatives if able. No additional questions/concerns at this time.    HRT/VAS Care Coordination     Patients Home Cardiology Office --  [facility]   Outpatient Care Team Social Worker   Social Worker Name: Octavio Graves, [place], [phone]   Living arrangements for the past 2 months Single Family Home   Lives with: Relatives; Adult Children; Minor Children   Patient Current Insurance Coverage Managed Medicare   Patient Has Concern With Paying Medical Bills No   Does Patient Have Prescription Coverage? Yes       Social History:  SDOH Screenings   Food Insecurity: No Food Insecurity (04/02/2023)  Housing: Low Risk  (04/02/2023)  Transportation Needs: No Transportation Needs (04/02/2023)  Utilities: Not At Risk (04/02/2023)  Depression (PHQ2-9): Low Risk  (07/28/2022)  Financial Resource Strain: Medium Risk (04/02/2023)  Physical Activity: Sufficiently Active (07/14/2018)  Social Connections: Moderately Integrated (07/14/2018)  Stress: Stress Concern Present (04/02/2023)  Tobacco Use: Low Risk  (04/01/2023)    SDOH Interventions: Financial Resources:  Financial Strain Interventions: Other (Comment) (pt states things are tight but they make ends meet with her SSDI, daughters income, Medicaid coverage and SNAP benefits.) DSS for financial assistance  Food Insecurity:  Food Insecurity Interventions: Intervention Not Indicated  Housing Insecurity:  Housing Interventions: Intervention Not Indicated  Transportation:   Transportation Interventions: Intervention Not Indicated    Other Care Navigation Interventions:     Provided  Pharmacy assistance resources  Pt denies any issues obtaining/affording medications at this time.   Patient expressed Mental Health concerns Yes, Referred to:  [employer]om[employer] for TEFL teacher for [employer] support group/resources   Follow-up plan:   LCSW has sent pt a Wellsite geologist with resources discussed: Statistic[employer] Grandchildren support group and resource group. LCSW will f/u if I dont hear back from pt regarding these resources and to provide a check in.

## 2023-04-15 ENCOUNTER — Telehealth: Payer: Self-pay | Admitting: Licensed Clinical Social Worker

## 2023-04-15 NOTE — Telephone Encounter (Signed)
H&V Care Navigation CSW Progress Note  Clinical Social Worker contacted patient by phone to f/u as I noted neither myChart message read. LCSW able to reach pt this morning at [phone]. Re-introduced self, role, reason for call. Pt confirmed she had logged into myChart but didn't know how to find the messages, LCSW was able to review how to access those and pt confirmed she has access now. Pt had contacted [facility] and they set up a call with an attorney. She spoke with that attorney and was given their advice but told it would be a complicated process that she should think about whether or not to proceed. Pt feels still uncomfortable without resolution to having guardianship/adoption of her grandchildren. I shared that she should consider going to [employer] group at [employer] (child care available) as she may find that others in the group have a similar situation or that they have worked with Pension scheme manager that have been favorable/preferred for situations such as this. Pt appreciative of f/u and resources. Again, encouraged her to let us know if she still cannot find advice that she finds helpful, cautioned we are limited when it comes to civil matters like adoption/guardianship/divorce etc as we cannot recommend specific practitioners. No additional questions at this time. I remain available should pt have additional questions that arise.  Patient is participating in a Managed Medicaid Plan:  No, UHC Medicare Dual Complete  SDOH Screenings   Food Insecurity: No Food Insecurity (04/02/2023)  Housing: Low Risk  (04/02/2023)  Transportation Needs: No Transportation Needs (04/02/2023)  Utilities: Not At Risk (04/02/2023)  Depression (PHQ2-9): Low Risk  (07/28/2022)  Financial Resource Strain: Medium Risk (04/02/2023)  Physical Activity: Sufficiently Active (07/14/2018)  Social Connections: Moderately Integrated (07/14/2018)  Stress: Stress Concern Present  (04/02/2023)  Tobacco Use: Low Risk  (04/01/2023)   Octavio Graves, MSW, LCSW Clinical Social Worker II [facility] Heart/Vascular Care Navigation  [phone]- work cell phone (preferred) [phone]- desk phone

## 2023-04-16 ENCOUNTER — Other Ambulatory Visit (HOSPITAL_BASED_OUTPATIENT_CLINIC_OR_DEPARTMENT_OTHER): Payer: Self-pay

## 2023-04-19 DIAGNOSIS — G4733 Obstructive sleep apnea (adult) (pediatric): Secondary | ICD-10-CM | POA: Diagnosis not present

## 2023-04-20 ENCOUNTER — Other Ambulatory Visit (HOSPITAL_BASED_OUTPATIENT_CLINIC_OR_DEPARTMENT_OTHER): Payer: Self-pay

## 2023-04-20 MED ORDER — MOUNJARO 10 MG/0.5ML ~~LOC~~ SOAJ
10.0000 mg | SUBCUTANEOUS | 0 refills | Status: DC
  Filled 2023-04-20: qty 2, 28d supply, fill #0

## 2023-04-22 DIAGNOSIS — E1142 Type 2 diabetes mellitus with diabetic polyneuropathy: Secondary | ICD-10-CM | POA: Diagnosis not present

## 2023-04-22 DIAGNOSIS — E782 Mixed hyperlipidemia: Secondary | ICD-10-CM | POA: Diagnosis not present

## 2023-04-22 DIAGNOSIS — E559 Vitamin D deficiency, unspecified: Secondary | ICD-10-CM | POA: Diagnosis not present

## 2023-04-22 DIAGNOSIS — G4733 Obstructive sleep apnea (adult) (pediatric): Secondary | ICD-10-CM | POA: Diagnosis not present

## 2023-04-23 DIAGNOSIS — E1142 Type 2 diabetes mellitus with diabetic polyneuropathy: Secondary | ICD-10-CM | POA: Diagnosis not present

## 2023-04-23 DIAGNOSIS — E559 Vitamin D deficiency, unspecified: Secondary | ICD-10-CM | POA: Diagnosis not present

## 2023-04-24 DIAGNOSIS — R1031 Right lower quadrant pain: Secondary | ICD-10-CM | POA: Diagnosis not present

## 2023-04-24 DIAGNOSIS — N10 Acute tubulo-interstitial nephritis: Secondary | ICD-10-CM | POA: Diagnosis not present

## 2023-04-24 DIAGNOSIS — R300 Dysuria: Secondary | ICD-10-CM | POA: Diagnosis not present

## 2023-04-26 DIAGNOSIS — N12 Tubulo-interstitial nephritis, not specified as acute or chronic: Secondary | ICD-10-CM | POA: Diagnosis not present

## 2023-05-04 DIAGNOSIS — G4733 Obstructive sleep apnea (adult) (pediatric): Secondary | ICD-10-CM | POA: Diagnosis not present

## 2023-05-08 ENCOUNTER — Encounter (HOSPITAL_BASED_OUTPATIENT_CLINIC_OR_DEPARTMENT_OTHER): Payer: Self-pay | Admitting: Cardiology

## 2023-05-10 ENCOUNTER — Other Ambulatory Visit: Payer: Self-pay

## 2023-05-10 ENCOUNTER — Emergency Department
Admission: EM | Admit: 2023-05-10 | Discharge: 2023-05-10 | Disposition: A | Discharge status: Home / Self Care | Payer: 59 | Attending: Emergency Medicine | Admitting: Emergency Medicine

## 2023-05-10 DIAGNOSIS — I509 Heart failure, unspecified: Secondary | ICD-10-CM | POA: Insufficient documentation

## 2023-05-10 DIAGNOSIS — S61011A Laceration without foreign body of right thumb without damage to nail, initial encounter: Secondary | ICD-10-CM | POA: Diagnosis not present

## 2023-05-10 DIAGNOSIS — W268XXA Contact with other sharp object(s), not elsewhere classified, initial encounter: Secondary | ICD-10-CM | POA: Diagnosis not present

## 2023-05-10 DIAGNOSIS — Z23 Encounter for immunization: Secondary | ICD-10-CM | POA: Insufficient documentation

## 2023-05-10 DIAGNOSIS — Z950 Presence of cardiac pacemaker: Secondary | ICD-10-CM | POA: Insufficient documentation

## 2023-05-10 DIAGNOSIS — E119 Type 2 diabetes mellitus without complications: Secondary | ICD-10-CM | POA: Diagnosis not present

## 2023-05-10 DIAGNOSIS — Z7901 Long term (current) use of anticoagulants: Secondary | ICD-10-CM | POA: Insufficient documentation

## 2023-05-10 DIAGNOSIS — S6991XA Unspecified injury of right wrist, hand and finger(s), initial encounter: Secondary | ICD-10-CM | POA: Diagnosis present

## 2023-05-10 MED ORDER — LIDOCAINE-EPINEPHRINE-TETRACAINE (LET) TOPICAL GEL
3.0000 mL | Freq: Once | TOPICAL | Status: DC
  Filled 2023-05-10: qty 3

## 2023-05-10 MED ORDER — TETANUS-DIPHTH-ACELL PERTUSSIS 5-2.5-18.5 LF-MCG/0.5 IM SUSY
0.5000 mL | PREFILLED_SYRINGE | Freq: Once | INTRAMUSCULAR | Status: AC
  Administered 2023-05-10: 0.5 mL via INTRAMUSCULAR
  Filled 2023-05-10: qty 0.5

## 2023-05-10 NOTE — ED Provider Notes (Signed)
[facility] Emergency Department Provider Note     Event Date/Time   First MD Initiated Contact with Patient 05/10/23 1552     (approximate)   History   Laceration   HPI  Sophia Wilkinson is a 57 y.o. female with a history of CHF, DM, HLD, A-fib on Eliquis, and implantable pacemaker, presents herself to the ED for evaluation of accidental laceration to the right thumb.  She apparently cut the thumb on a piece of metal event earlier today.  She reports a tetanus status that is greater than 10 years.  She does take Eliquis daily.     Physical Exam   Triage Vital Signs: ED Triage Vitals [05/10/23 1425]  Enc Vitals Group     BP 118/82     Pulse Rate 70     Resp 16     Temp 98.6 F (37 C)     Temp Source Oral     SpO2 100 %     Weight 248 lb (112.5 kg)     Height 5\' 6"  (1.676 m)     Head Circumference      Peak Flow      Pain Score 7     Pain Loc      Pain Edu?      Excl. in GC?     Most recent vital signs: Vitals:   05/10/23 1425  BP: 118/82  Pulse: 70  Resp: 16  Temp: 98.6 F (37 C)  SpO2: 100%    General Awake, no distress. NAD CV:  Good peripheral perfusion.  RESP:  Normal effort.  MSK:  Normal composite fist on the right hand.  Superficial laceration to lateral right thumb noted.  No active bleeding at this time.  No nail injury or nail avulsion noted.   ED Results / Procedures / Treatments   Labs (all labs ordered are listed, but only abnormal results are displayed) Labs Reviewed - No data to display   EKG   RADIOLOGY   No results found.   PROCEDURES:  Critical Care performed: No  ..Laceration Repair  Date/Time: 05/10/2023 4:11 PM  Performed by: Lissa Hoard, PA-C Authorized by: Lissa Hoard, PA-C   Consent:    Consent obtained:  Verbal   Consent given by:  Patient   Risks, benefits, and alternatives were discussed: yes     Risks discussed:  Pain   Alternatives discussed:  No  treatment Universal protocol:    Site/side marked: yes     Patient identity confirmed:  Verbally with patient Anesthesia:    Anesthesia method:  None Laceration details:    Location:  Finger   Finger location:  R thumb   Length (cm):  1   Depth (mm):  3 Pre-procedure details:    Preparation:  Patient was prepped and draped in usual sterile fashion Exploration:    Limited defect created (wound extended): no     Contaminated: no   Treatment:    Area cleansed with:  Saline   Amount of cleaning:  Standard   Irrigation solution:  Sterile saline   Irrigation volume:  10   Irrigation method:  Syringe   Debridement:  None   Undermining:  None   Scar revision: no   Skin repair:    Repair method:  Tissue adhesive Approximation:    Approximation:  Close Repair type:    Repair type:  Simple Post-procedure details:    Dressing:  Open (no  dressing)   Procedure completion:  Tolerated well, no immediate complications    MEDICATIONS ORDERED IN ED: Medications  Tdap (BOOSTRIX) injection 0.5 mL (has no administration in time range)     IMPRESSION / MDM / ASSESSMENT AND PLAN / ED COURSE  I reviewed the triage vital signs and the nursing notes.                              Differential diagnosis includes, but is not limited to, finger abrasion, finger laceration, finger contusion  Patient's presentation is most consistent with acute, uncomplicated illness.  Patient's diagnosis is consistent with superficial laceration to the right thumb. Patient will be discharged home with wound care instructions.  Patient's tetanus status is updated with a Tdap booster.  Patient is to follow up with her primary provider as needed or otherwise directed. Patient is given ED precautions to return to the ED for any worsening or new symptoms.     FINAL CLINICAL IMPRESSION(S) / ED DIAGNOSES   Final diagnoses:  Laceration of right thumb without foreign body without damage to nail, initial encounter      Rx / DC Orders   ED Discharge Orders     None        Note:  This document was prepared using Dragon voice recognition software and may include unintentional dictation errors.    Lissa Hoard, PA-C 05/10/23 1631    Dionne Bucy, MD 05/10/23 2008

## 2023-05-10 NOTE — Discharge Instructions (Addendum)
The wound clean, dry, and covered.  Avoid any lotions, creams, ointments, oils over the wound glue.

## 2023-05-10 NOTE — ED Triage Notes (Signed)
Pt to ED POV for laceration to R thumb from metal vent today. Last Tdap >10 years ago. Laceration is well approximated and not bleeding at this time. Pt takes Eliquis 5mg  BID.

## 2023-05-12 ENCOUNTER — Other Ambulatory Visit: Payer: Self-pay | Admitting: Psychiatry

## 2023-05-12 NOTE — Telephone Encounter (Signed)
Last seen on 12/27/22 per note "Increase amitriptyline to 50 mg QHS " Follow up scheduled on 08/26/23 Last filled on 04/23/23 #60 tablets (22 day supply)

## 2023-05-13 ENCOUNTER — Other Ambulatory Visit (HOSPITAL_BASED_OUTPATIENT_CLINIC_OR_DEPARTMENT_OTHER): Payer: Self-pay

## 2023-05-13 DIAGNOSIS — S61012A Laceration without foreign body of left thumb without damage to nail, initial encounter: Secondary | ICD-10-CM | POA: Diagnosis not present

## 2023-05-13 MED ORDER — MOUNJARO 10 MG/0.5ML ~~LOC~~ SOAJ
10.0000 mg | SUBCUTANEOUS | 0 refills | Status: AC
  Filled 2023-05-13: qty 2, 28d supply, fill #0

## 2023-05-14 DIAGNOSIS — H25813 Combined forms of age-related cataract, bilateral: Secondary | ICD-10-CM | POA: Diagnosis not present

## 2023-05-14 DIAGNOSIS — H524 Presbyopia: Secondary | ICD-10-CM | POA: Diagnosis not present

## 2023-05-14 DIAGNOSIS — H11153 Pinguecula, bilateral: Secondary | ICD-10-CM | POA: Diagnosis not present

## 2023-05-14 DIAGNOSIS — E119 Type 2 diabetes mellitus without complications: Secondary | ICD-10-CM | POA: Diagnosis not present

## 2023-05-14 DIAGNOSIS — H04123 Dry eye syndrome of bilateral lacrimal glands: Secondary | ICD-10-CM | POA: Diagnosis not present

## 2023-05-19 DIAGNOSIS — G4733 Obstructive sleep apnea (adult) (pediatric): Secondary | ICD-10-CM | POA: Diagnosis not present

## 2023-05-26 ENCOUNTER — Ambulatory Visit (INDEPENDENT_AMBULATORY_CARE_PROVIDER_SITE_OTHER): Payer: 59

## 2023-05-26 DIAGNOSIS — I495 Sick sinus syndrome: Secondary | ICD-10-CM | POA: Diagnosis not present

## 2023-05-26 DIAGNOSIS — E1142 Type 2 diabetes mellitus with diabetic polyneuropathy: Secondary | ICD-10-CM | POA: Diagnosis not present

## 2023-05-26 DIAGNOSIS — G4733 Obstructive sleep apnea (adult) (pediatric): Secondary | ICD-10-CM | POA: Diagnosis not present

## 2023-05-26 DIAGNOSIS — E559 Vitamin D deficiency, unspecified: Secondary | ICD-10-CM | POA: Diagnosis not present

## 2023-05-26 DIAGNOSIS — E782 Mixed hyperlipidemia: Secondary | ICD-10-CM | POA: Diagnosis not present

## 2023-05-27 LAB — CUP PACEART REMOTE DEVICE CHECK
Battery Remaining Longevity: 42 mo
Battery Remaining Percentage: 30 %
Battery Voltage: 2.93 V
Brady Statistic AP VP Percent: 1 %
Brady Statistic AP VS Percent: 1 %
Brady Statistic AS VP Percent: 1.4 %
Brady Statistic AS VS Percent: 98 %
Brady Statistic RA Percent Paced: 1 %
Brady Statistic RV Percent Paced: 1.6 %
Date Time Interrogation Session: 20240723020012
Implantable Lead Connection Status: 753985
Implantable Lead Connection Status: 753985
Implantable Lead Implant Date: 20160913
Implantable Lead Implant Date: 20160913
Implantable Lead Location: 753859
Implantable Lead Location: 753860
Implantable Lead Model: 1948
Implantable Pulse Generator Implant Date: 20160913
Lead Channel Impedance Value: 340 Ohm
Lead Channel Impedance Value: 600 Ohm
Lead Channel Pacing Threshold Amplitude: 0.75 V
Lead Channel Pacing Threshold Amplitude: 1 V
Lead Channel Pacing Threshold Pulse Width: 0.4 ms
Lead Channel Pacing Threshold Pulse Width: 0.4 ms
Lead Channel Sensing Intrinsic Amplitude: 2.1 mV
Lead Channel Sensing Intrinsic Amplitude: 3.2 mV
Lead Channel Setting Pacing Amplitude: 1.25 V
Lead Channel Setting Pacing Amplitude: 1.75 V
Lead Channel Setting Pacing Pulse Width: 0.4 ms
Lead Channel Setting Sensing Sensitivity: 0.7 mV
Pulse Gen Model: 2240
Pulse Gen Serial Number: 7676319

## 2023-06-01 ENCOUNTER — Other Ambulatory Visit (HOSPITAL_BASED_OUTPATIENT_CLINIC_OR_DEPARTMENT_OTHER): Payer: Self-pay

## 2023-06-08 DIAGNOSIS — Z0000 Encounter for general adult medical examination without abnormal findings: Secondary | ICD-10-CM | POA: Diagnosis not present

## 2023-06-08 DIAGNOSIS — Z23 Encounter for immunization: Secondary | ICD-10-CM | POA: Diagnosis not present

## 2023-06-11 NOTE — Progress Notes (Signed)
Remote pacemaker transmission.   

## 2023-06-18 DIAGNOSIS — G4733 Obstructive sleep apnea (adult) (pediatric): Secondary | ICD-10-CM | POA: Diagnosis not present

## 2023-06-19 DIAGNOSIS — G4733 Obstructive sleep apnea (adult) (pediatric): Secondary | ICD-10-CM | POA: Diagnosis not present

## 2023-07-19 DIAGNOSIS — G4733 Obstructive sleep apnea (adult) (pediatric): Secondary | ICD-10-CM | POA: Diagnosis not present

## 2023-07-20 DIAGNOSIS — G4733 Obstructive sleep apnea (adult) (pediatric): Secondary | ICD-10-CM | POA: Diagnosis not present

## 2023-07-27 ENCOUNTER — Ambulatory Visit: Payer: Medicare Other | Admitting: Psychiatry

## 2023-07-28 DIAGNOSIS — E559 Vitamin D deficiency, unspecified: Secondary | ICD-10-CM | POA: Diagnosis not present

## 2023-07-28 DIAGNOSIS — E782 Mixed hyperlipidemia: Secondary | ICD-10-CM | POA: Diagnosis not present

## 2023-07-28 DIAGNOSIS — E1142 Type 2 diabetes mellitus with diabetic polyneuropathy: Secondary | ICD-10-CM | POA: Diagnosis not present

## 2023-08-12 DIAGNOSIS — M1712 Unilateral primary osteoarthritis, left knee: Secondary | ICD-10-CM | POA: Diagnosis not present

## 2023-08-12 DIAGNOSIS — M25462 Effusion, left knee: Secondary | ICD-10-CM | POA: Diagnosis not present

## 2023-08-12 DIAGNOSIS — M25562 Pain in left knee: Secondary | ICD-10-CM | POA: Diagnosis not present

## 2023-08-18 DIAGNOSIS — G4733 Obstructive sleep apnea (adult) (pediatric): Secondary | ICD-10-CM | POA: Diagnosis not present

## 2023-08-19 DIAGNOSIS — G4733 Obstructive sleep apnea (adult) (pediatric): Secondary | ICD-10-CM | POA: Diagnosis not present

## 2023-08-25 ENCOUNTER — Ambulatory Visit (INDEPENDENT_AMBULATORY_CARE_PROVIDER_SITE_OTHER): Payer: 59

## 2023-08-25 DIAGNOSIS — I495 Sick sinus syndrome: Secondary | ICD-10-CM | POA: Diagnosis not present

## 2023-08-26 ENCOUNTER — Ambulatory Visit: Payer: 59 | Admitting: Psychiatry

## 2023-08-26 DIAGNOSIS — E782 Mixed hyperlipidemia: Secondary | ICD-10-CM | POA: Diagnosis not present

## 2023-08-26 DIAGNOSIS — E559 Vitamin D deficiency, unspecified: Secondary | ICD-10-CM | POA: Diagnosis not present

## 2023-08-26 DIAGNOSIS — E1142 Type 2 diabetes mellitus with diabetic polyneuropathy: Secondary | ICD-10-CM | POA: Diagnosis not present

## 2023-08-26 LAB — CUP PACEART REMOTE DEVICE CHECK
Battery Remaining Longevity: 39 mo
Battery Remaining Percentage: 28 %
Battery Voltage: 2.93 V
Brady Statistic AP VP Percent: 1 %
Brady Statistic AP VS Percent: 1 %
Brady Statistic AS VP Percent: 1.4 %
Brady Statistic AS VS Percent: 98 %
Brady Statistic RA Percent Paced: 1 %
Brady Statistic RV Percent Paced: 1.6 %
Date Time Interrogation Session: 20241022020013
Implantable Lead Connection Status: 753985
Implantable Lead Connection Status: 753985
Implantable Lead Implant Date: 20160913
Implantable Lead Implant Date: 20160913
Implantable Lead Location: 753859
Implantable Lead Location: 753860
Implantable Lead Model: 1948
Implantable Pulse Generator Implant Date: 20160913
Lead Channel Impedance Value: 350 Ohm
Lead Channel Impedance Value: 600 Ohm
Lead Channel Pacing Threshold Amplitude: 0.625 V
Lead Channel Pacing Threshold Amplitude: 1 V
Lead Channel Pacing Threshold Pulse Width: 0.4 ms
Lead Channel Pacing Threshold Pulse Width: 0.4 ms
Lead Channel Sensing Intrinsic Amplitude: 2.1 mV
Lead Channel Sensing Intrinsic Amplitude: 2.7 mV
Lead Channel Setting Pacing Amplitude: 1.25 V
Lead Channel Setting Pacing Amplitude: 1.625
Lead Channel Setting Pacing Pulse Width: 0.4 ms
Lead Channel Setting Sensing Sensitivity: 0.7 mV
Pulse Gen Model: 2240
Pulse Gen Serial Number: 7676319

## 2023-09-11 NOTE — Progress Notes (Signed)
Remote pacemaker transmission.   

## 2023-09-16 DIAGNOSIS — G4733 Obstructive sleep apnea (adult) (pediatric): Secondary | ICD-10-CM | POA: Diagnosis not present

## 2023-09-19 DIAGNOSIS — G4733 Obstructive sleep apnea (adult) (pediatric): Secondary | ICD-10-CM | POA: Diagnosis not present

## 2023-09-21 ENCOUNTER — Other Ambulatory Visit: Payer: Self-pay

## 2023-09-25 ENCOUNTER — Ambulatory Visit (HOSPITAL_BASED_OUTPATIENT_CLINIC_OR_DEPARTMENT_OTHER): Payer: 59 | Admitting: Cardiology

## 2023-09-28 ENCOUNTER — Telehealth: Payer: Self-pay | Admitting: Psychiatry

## 2023-09-28 MED ORDER — NURTEC 75 MG PO TBDP: 75.0000 mg | ORAL_TABLET | ORAL | 3 refills | Status: DC | PRN

## 2023-09-28 NOTE — Telephone Encounter (Addendum)
Last seen on 01/01/23 Follow up scheduled on 11/12/23 Rx sent to [facility]  Patient informed this has been done

## 2023-09-28 NOTE — Telephone Encounter (Signed)
Pt called wanting a refill for her Nurtec. Pt states she has been trying to get this filled for about a month. And [facility] has been sending in the requests for the medication as well.

## 2023-10-02 ENCOUNTER — Telehealth: Payer: Self-pay | Admitting: Cardiology

## 2023-10-02 MED ORDER — CANDESARTAN CILEXETIL 4 MG PO TABS: 2.0000 mg | ORAL_TABLET | Freq: Every day | ORAL | 0 refills | Status: AC

## 2023-10-02 NOTE — Telephone Encounter (Signed)
   Patient and daughter called requesting a refill of Candesartan. Patient typically has had this prescribed by PCP but has been unable to get a call back for new Rx. Patient seen by Dr. Cristal Deer in May of this year on 2mg  of Candesartan and was noted with stable BP. Will send bridge Rx to requested pharmacy. Advised patient to continue to reach out to PCP regarding an updated Rx.   Perlie Gold, PA-C

## 2023-10-07 DIAGNOSIS — I495 Sick sinus syndrome: Secondary | ICD-10-CM | POA: Diagnosis not present

## 2023-10-07 DIAGNOSIS — D6869 Other thrombophilia: Secondary | ICD-10-CM | POA: Diagnosis not present

## 2023-10-07 DIAGNOSIS — E1142 Type 2 diabetes mellitus with diabetic polyneuropathy: Secondary | ICD-10-CM | POA: Diagnosis not present

## 2023-10-07 DIAGNOSIS — E782 Mixed hyperlipidemia: Secondary | ICD-10-CM | POA: Diagnosis not present

## 2023-10-07 DIAGNOSIS — E261 Secondary hyperaldosteronism: Secondary | ICD-10-CM | POA: Diagnosis not present

## 2023-10-19 DIAGNOSIS — G4733 Obstructive sleep apnea (adult) (pediatric): Secondary | ICD-10-CM | POA: Diagnosis not present

## 2023-10-31 DIAGNOSIS — G4733 Obstructive sleep apnea (adult) (pediatric): Secondary | ICD-10-CM | POA: Diagnosis not present

## 2023-11-08 ENCOUNTER — Ambulatory Visit
Admission: RE | Admit: 2023-11-08 | Discharge: 2023-11-08 | Disposition: A | Discharge status: Home / Self Care | Payer: 59 | Source: Ambulatory Visit | Attending: Emergency Medicine | Admitting: Emergency Medicine

## 2023-11-08 VITALS — BP 106/73 | HR 65 | Temp 98.9°F | Resp 16

## 2023-11-08 DIAGNOSIS — S61211A Laceration without foreign body of left index finger without damage to nail, initial encounter: Secondary | ICD-10-CM | POA: Diagnosis not present

## 2023-11-08 MED ORDER — DOXYCYCLINE HYCLATE 100 MG PO CAPS: 100.0000 mg | ORAL_CAPSULE | Freq: Two times a day (BID) | ORAL | 0 refills | Status: DC

## 2023-11-08 NOTE — ED Triage Notes (Signed)
 Reports laceration to distal aspect left index finger sustained 2 days ago from knife while cutting open pkg of salmon. No active bleeding. Wound without S/S infection. Has been wearing Bandaid.

## 2023-11-08 NOTE — ED Provider Notes (Signed)
 Sophia Wilkinson    CSN: [id number] Arrival date & time: 11/08/23  1229      History   Chief Complaint Chief Complaint  Patient presents with   Finger Laceration    2 days ago    HPI Sophia Wilkinson is a 58 y.o. female.   Patient presents for evaluation of a laceration to the left index finger that occurred 2 days ago from a kitchen knife.  Cleansed with peroxide and has kept covered with Band-Aid.  Endorses a tingling and pain felt at rest and with all movement but able to complete range of motion.  Denies numbness, drainage or fever.  Last tetanus 2024.  Past Medical History:  Diagnosis Date   Allergy    Anemia    Atrial fibrillation (HCC) 02/2023   on eliquis  5mg  BID   Blood transfusion without reported diagnosis    Bradycardia    Cancer (HCC)    Cardiomyopathy (HCC)    CHF (congestive heart failure) (HCC)    Chronic pain    Diabetes mellitus without complication (HCC)    GERD (gastroesophageal reflux disease)    Heart murmur    Hyperlipidemia    Migraines    Sleep apnea     Patient Active Problem List   Diagnosis Date Noted   Chronic cough 11/14/2022   Pulmonary nodule less than 1 cm in diameter with low risk for malignant neoplasm 11/14/2022   Type 2 diabetes mellitus without complication, without long-term current use of insulin (HCC) 06/25/2021   Spasm of thoracic back muscle 06/10/2020   Right shoulder pain 01/13/2020   Left knee pain 01/13/2020   Numbness and tingling of upper and lower extremities of both sides 10/15/2019   Essential hypertension 06/28/2019   Sleeping difficulties 05/24/2019   Hemorrhoids 12/24/2018   Sciatica 12/24/2018   Prediabetes 07/28/2016   Pacemaker 06/20/2016   Depression 04/22/2016   H/O: hysterectomy 10/16/2015   Hypertrophic cardiomyopathy (HCC) 05/23/2015   Chronic left shoulder pain 08/10/2014   Diastolic CHF (HCC) 12/19/2012   OSA on CPAP 06/10/2011   Thrombocytosis 03/14/2011   Migraine without aura  09/20/2010   History of lead poisoning 09/20/2010   Class 3 severe obesity with body mass index (BMI) of 45.0 to 49.9 in adult [facility]) 02/07/2008   Mitral valve prolapse 03/24/2006    Past Surgical History:  Procedure Laterality Date   ABDOMINAL HYSTERECTOMY  2016   HERNIA REPAIR     LAPAROSCOPY ABDOMEN DIAGNOSTIC     LEEP     PACEMAKER IMPLANT  2016   TUBAL LIGATION  1999    OB History   No obstetric history on file.      Home Medications    Prior to Admission medications   Medication Sig Start Date End Date Taking? Authorizing Provider  acetaminophen (TYLENOL) 650 MG CR tablet Take 650 mg by mouth 2 (two) times daily.   Yes [provider]  amitriptyline  (ELAVIL ) 25 MG tablet TAKE 2 TABLETS BY MOUTH AT BEDTIME 05/12/23  Yes Chima, Delon, MD  apixaban  (ELIQUIS ) 5 MG TABS tablet Take 1 tablet (5 mg total) by mouth 2 (two) times daily. 03/05/23  Yes Croitoru, Mihai, MD  candesartan  (ATACAND ) 4 MG tablet Take 0.5 tablets (2 mg total) by mouth daily. Please contact PCP for subsequent refills. 10/02/23  Yes Trudy Birmingham, PA-C  doxycycline  (VIBRAMYCIN ) 100 MG capsule Take 1 capsule (100 mg total) by mouth 2 (two) times daily. 11/08/23  Yes Teresa Shelba SAUNDERS, NP  famotidine (PEPCID) 20 MG tablet Take 20 mg by mouth daily. 02/05/23  Yes [provider]  gabapentin (NEURONTIN) 300 MG capsule Take 300 mg by mouth 3 (three) times daily.   Yes [provider]  metoprolol  succinate (TOPROL -XL) 25 MG 24 hr tablet Take 3 tablets (75 mg total) by mouth daily. 12/25/22  Yes Lonni Slain, MD  montelukast (SINGULAIR) 10 MG tablet Take 10 mg by mouth at bedtime.   Yes [provider]  MOUNJARO  7.5 MG/0.5ML Pen Inject 7.5 mg into the skin once a week. 12/17/22  Yes [provider]  potassium chloride  (KLOR-CON ) 10 MEQ tablet Take 10 mEq by mouth as needed (with the fluid pill).   Yes [provider]  Rimegepant Sulfate (NURTEC) 75 MG TBDP Take  1 tablet (75 mg total) by mouth as needed (for migraine). Max dose 1 pill in 24 hours 09/28/23  Yes Sater, Charlie LABOR, MD  rosuvastatin  (CRESTOR ) 20 MG tablet Take 20 mg by mouth daily.   Yes [provider]  spironolactone  (ALDACTONE ) 25 MG tablet Take 1/2 (one-half) tablet by mouth once daily 08/08/20  Yes Mahoney, Caitlin, MD  torsemide  (DEMADEX ) 10 MG tablet Take 1 tablet (10 mg total) by mouth daily as needed (weight gain 3 lbs overnight or 5 lbs in a week). 06/25/21  Yes Lonni Slain, MD  Vitamin D, Ergocalciferol, (DRISDOL) 1.25 MG (50000 UNIT) CAPS capsule Take 50,000 Units by mouth once a week. 12/14/22  Yes [provider]  ACCU-CHEK GUIDE test strip USE 1 ONCE DAILY BEFORE BREAKFAST 12/24/22   [provider]  Accu-Chek Softclix Lancets lancets SMARTSIG:1 Topical Daily 01/25/23   [provider]  Acetaminophen 500 MG capsule Take 500 mg by mouth as needed.    [provider]  albuterol  (VENTOLIN  HFA) 108 (90 Base) MCG/ACT inhaler Inhale 2 puffs into the lungs every 4 (four) hours as needed for wheezing or shortness of breath. 05/25/22   Jacolyn Pae, MD  chlorpheniramine (CHLOR-TRIMETON) 4 MG tablet Take 4 mg by mouth 2 (two) times daily as needed for allergies.    [provider]  diclofenac  Sodium (VOLTAREN ) 1 % GEL Apply 2 g topically as needed. 04/25/22   [provider]  fluticasone-salmeterol (ADVAIR) 250-50 MCG/ACT AEPB Inhale 1 puff into the lungs in the morning and at bedtime. 02/05/23   [provider]  Multiple Vitamin (MULTIVITAMIN WITH MINERALS) TABS tablet Take 1 tablet by mouth daily.    [provider]  pseudoephedrine  (SUDAFED) 60 MG tablet Take 1 tablet (60 mg total) by mouth every 6 (six) hours as needed for congestion. 05/25/22   Siadecki, Sebastian, MD  senna-docusate (SENOKOT-S) 8.6-50 MG tablet Take 1 tablet by mouth every morning. 10/22/22   [provider]  tirzepatide   (MOUNJARO ) 10 MG/0.5ML Pen Inject 10 mg into the skin once a week. 05/13/23       Family History Family History  Problem Relation Age of Onset   Alcohol abuse Mother    Liver disease Mother    Alcohol abuse Father    Drug abuse Father    Alcohol abuse Sister    Heart disease Maternal Grandmother    Diabetes Maternal Grandmother    Hyperlipidemia Maternal Grandmother    Heart disease Paternal Grandmother    Diabetes Sister    Hypertension Daughter    Diabetes Daughter    Colon cancer Neg Hx    Esophageal cancer Neg Hx    Rectal cancer Neg Hx  Stomach cancer Neg Hx     Social History Social History   Tobacco Use   Smoking status: Never    Passive exposure: Past   Smokeless tobacco: Never  Vaping Use   Vaping status: Never Used  Substance Use Topics   Alcohol use: Never   Drug use: Never     Allergies   Metformin    Review of Systems Review of Systems   Physical Exam Triage Vital Signs ED Triage Vitals  Encounter Vitals Group     BP 11/08/23 1326 106/73     Systolic BP Percentile --      Diastolic BP Percentile --      Pulse Rate 11/08/23 1326 65     Resp 11/08/23 1326 16     Temp 11/08/23 1326 98.9 F (37.2 C)     Temp Source 11/08/23 1326 Oral     SpO2 11/08/23 1326 97 %     Weight --      Height --      Head Circumference --      Peak Flow --      Pain Score 11/08/23 1324 5     Pain Loc --      Pain Education --      Exclude from Growth Chart --    No data found.  Updated Vital Signs BP 106/73   Pulse 65   Temp 98.9 F (37.2 C) (Oral)   Resp 16   SpO2 97%   Visual Acuity Right Eye Distance:   Left Eye Distance:   Bilateral Distance:    Right Eye Near:   Left Eye Near:    Bilateral Near:     Physical Exam Constitutional:      Appearance: Normal appearance.  Eyes:     Extraocular Movements: Extraocular movements intact.  Pulmonary:     Effort: Pulmonary effort is normal.  Skin:    Comments: 0.5 cm laceration present to the  distal phalanx of the left index finger on the palmar aspect, bleeding subsided, scant ecchymosis, tender to palpation, no erythema or, drainage, sensation  intact, capillary refill less than 3  Neurological:     Mental Status: She is alert and oriented to person, place, and time. Mental status is at baseline.      UC Treatments / Results  Labs (all labs ordered are listed, but only abnormal results are displayed) Labs Reviewed - No data to display  EKG   Radiology No results found.  Procedures Procedures (including critical care time)  Medications Ordered in UC Medications - No data to display  Initial Impression / Assessment and Plan / UC Course  I have reviewed the triage vital signs and the nursing notes.  Pertinent labs & imaging results that were available during my care of the patient were reviewed by me and considered in my medical decision making (see chart for details).  Laceration of left index finger without foreign body without damage to nail, initial encounter  2 days post injury, stable for outpatient management, no signs of infection but as patient has had worsening pain we will prophylactically provide coverage, doxycycline  prescribed and recommended daily cleansing with soap and water and keeping covered with a nonadherent dressing, may use Tylenol or Motrin for pain will follow-up with urgent care as needed Final Clinical Impressions(s) / UC Diagnoses   Final diagnoses:  Laceration of left index finger without foreign body without damage to nail, initial encounter     Discharge Instructions  Today you were evaluated for your laceration  You are up-to-date your tetanus and kitchen knives are considered to be clean and therefore you will not need this today  We will prophylactically place you on antibiotic to keep area from becoming infected, it does not appear to be so at this time  Take doxycycline  every morning and every evening for 7  days  May use Tylenol or ibuprofen help manage pain  The ice or heat over the affected area 10 to 15-minute intervals  May follow-up for any concerns regarding healing   ED Prescriptions     Medication Sig Dispense Auth. Provider   doxycycline  (VIBRAMYCIN ) 100 MG capsule Take 1 capsule (100 mg total) by mouth 2 (two) times daily. 14 capsule Alondria Mousseau, Shelba SAUNDERS, NP      PDMP not reviewed this encounter.   Teresa Shelba SAUNDERS, NP 11/08/23 1418

## 2023-11-08 NOTE — Discharge Instructions (Signed)
 Today you were evaluated for your laceration  You are up-to-date your tetanus and kitchen knives are considered to be clean and therefore you will not need this today  We will prophylactically place you on antibiotic to keep area from becoming infected, it does not appear to be so at this time  Take doxycycline  every morning and every evening for 7 days  May use Tylenol or ibuprofen help manage pain  The ice or heat over the affected area 10 to 15-minute intervals  May follow-up for any concerns regarding healing

## 2023-11-10 ENCOUNTER — Other Ambulatory Visit (HOSPITAL_COMMUNITY): Payer: Self-pay

## 2023-11-11 NOTE — Progress Notes (Signed)
 CC:  headaches  Follow-up Visit  Last visit: 01/01/2023 with Dr. Rush  Brief HPI: 58 year old female with a history of DM2, HLD, OSA, CHF, bradycardia s/p pacemaker, asthma, cervical cancer 2021 who follows in clinic for migraines and neck pain.  CT C-spine 09/2022 mild encroachment of neural foramina from C4-C7 with no central spinal stenosis.  At her last visit, amitriptyline  was increased to 50 mg nightly and continued on Nurtec for rescue  Interval History:  Reports worsening migraines over the past couple of weeks. Prior to worsening, she was experiencing 3-4 migraines per month. Now she is experiencing 2 to 3/week, has been waking up (middle of night or morning) with the migraine, can last up to a couple of hours. She is currently on amitriptyline  75 mg nightly, was increased by [facility] wellness about 4 weeks ago, has helped her sleep better. Use of Nurtec occasionally  but will not take if she is unable to lay down as she prefers to lay down after taking although denies side effects. Nightly use of CPAP for OSA which is managed through [facility].  Denies any other recent medication changes or recent illness.    Migraine days per month: 8-12   Current Headache Regimen: Preventative: amitriptyline  75 mg QHS Abortive: Nurtec 75 mg PRN   Prior Therapies                                  Amitriptyline  50 mg QHS Candesartan  2 mg daily Metoprolol  25 mg daily Gabapentin 300 mg TID Nurtec 75 mg PRN Neck PT - did not help Aimovig - contraindicated d/t constipation    Current Outpatient Medications on File Prior to Visit  Medication Sig Dispense Refill   ACCU-CHEK GUIDE test strip USE 1 ONCE DAILY BEFORE BREAKFAST     Accu-Chek Softclix Lancets lancets SMARTSIG:1 Topical Daily     acetaminophen (TYLENOL) 650 MG CR tablet Take 650 mg by mouth 2 (two) times daily.     Acetaminophen 500 MG capsule Take 500 mg by mouth as needed.     albuterol  (VENTOLIN  HFA) 108 (90 Base) MCG/ACT  inhaler Inhale 2 puffs into the lungs every 4 (four) hours as needed for wheezing or shortness of breath. 8 g 0   amitriptyline  (ELAVIL ) 25 MG tablet TAKE 2 TABLETS BY MOUTH AT BEDTIME 60 tablet 3   apixaban  (ELIQUIS ) 5 MG TABS tablet Take 1 tablet (5 mg total) by mouth 2 (two) times daily. 180 tablet 3   candesartan  (ATACAND ) 4 MG tablet Take 0.5 tablets (2 mg total) by mouth daily. Please contact PCP for subsequent refills. 15 tablet 0   chlorpheniramine (CHLOR-TRIMETON) 4 MG tablet Take 4 mg by mouth 2 (two) times daily as needed for allergies.     diclofenac  Sodium (VOLTAREN ) 1 % GEL Apply 2 g topically as needed.     doxycycline  (VIBRAMYCIN ) 100 MG capsule Take 1 capsule (100 mg total) by mouth 2 (two) times daily. 14 capsule 0   famotidine (PEPCID) 20 MG tablet Take 20 mg by mouth daily.     fluticasone-salmeterol (ADVAIR) 250-50 MCG/ACT AEPB Inhale 1 puff into the lungs in the morning and at bedtime.     gabapentin (NEURONTIN) 300 MG capsule Take 300 mg by mouth 3 (three) times daily.     metoprolol  succinate (TOPROL -XL) 25 MG 24 hr tablet Take 3 tablets (75 mg total) by mouth daily. 270 tablet 3  montelukast (SINGULAIR) 10 MG tablet Take 10 mg by mouth at bedtime.     MOUNJARO  7.5 MG/0.5ML Pen Inject 7.5 mg into the skin once a week.     Multiple Vitamin (MULTIVITAMIN WITH MINERALS) TABS tablet Take 1 tablet by mouth daily.     potassium chloride  (KLOR-CON ) 10 MEQ tablet Take 10 mEq by mouth as needed (with the fluid pill).     pseudoephedrine  (SUDAFED) 60 MG tablet Take 1 tablet (60 mg total) by mouth every 6 (six) hours as needed for congestion. 20 tablet 0   Rimegepant Sulfate (NURTEC) 75 MG TBDP Take 1 tablet (75 mg total) by mouth as needed (for migraine). Max dose 1 pill in 24 hours 8 tablet 3   rosuvastatin  (CRESTOR ) 20 MG tablet Take 20 mg by mouth daily.     senna-docusate (SENOKOT-S) 8.6-50 MG tablet Take 1 tablet by mouth every morning.     spironolactone  (ALDACTONE ) 25 MG  tablet Take 1/2 (one-half) tablet by mouth once daily 45 tablet 1   tirzepatide  (MOUNJARO ) 10 MG/0.5ML Pen Inject 10 mg into the skin once a week. 2 mL 0   torsemide  (DEMADEX ) 10 MG tablet Take 1 tablet (10 mg total) by mouth daily as needed (weight gain 3 lbs overnight or 5 lbs in a week). 90 tablet 3   Vitamin D, Ergocalciferol, (DRISDOL) 1.25 MG (50000 UNIT) CAPS capsule Take 50,000 Units by mouth once a week.     No current facility-administered medications on file prior to visit.   Past Medical History:  Diagnosis Date   Allergy    Anemia    Atrial fibrillation (HCC) 02/2023   on eliquis  5mg  BID   Blood transfusion without reported diagnosis    Bradycardia    Cancer (HCC)    Cardiomyopathy (HCC)    CHF (congestive heart failure) (HCC)    Chronic pain    Diabetes mellitus without complication (HCC)    GERD (gastroesophageal reflux disease)    Heart murmur    Hyperlipidemia    Migraines    Sleep apnea    Past Surgical History:  Procedure Laterality Date   ABDOMINAL HYSTERECTOMY  2016   HERNIA REPAIR     LAPAROSCOPY ABDOMEN DIAGNOSTIC     LEEP     PACEMAKER IMPLANT  2016   TUBAL LIGATION  1999      Physical Exam:   Vital Signs: Ht 5' 7 (1.702 m)   Wt 244 lb (110.7 kg)   BMI 38.22 kg/m  GENERAL:  well appearing, in no acute distress, alert  SKIN:  Color, texture, turgor normal. No rashes or lesions HEAD:  Normocephalic/atraumatic. RESP: normal respiratory effort MSK:  No gross joint deformities.   NEUROLOGICAL: Mental Status: Alert, oriented to person, place and time, Follows commands, and Speech fluent and appropriate. Cranial Nerves: PERRL, face symmetric, no dysarthria, hearing grossly intact Motor: moves all extremities equally Gait: normal-based.     IMPRESSION: 58 year old female with a history of DM2, HLD, OSA, CHF, bradycardia s/p pacemaker, asthma, cervical cancer 2021 who returns for follow up of migraines. She has had some improvement with  amitriptyline  but has experienced worsening over the past couple of weeks, now experiencing 8-12 migraines per month.    PLAN: -Prevention: Start Emgality  monthly injection, continue amitriptyline  to 75 mg at bedtime (increased by [employer] provider) -Rescue: Continue Nurtec 75 mg PRN -Next steps: consider SNRI, CGRP, qulipta for prevention    Follow-up: 6 months   I spent 30 minutes  of face-to-face and non-face-to-face time with patient.  This included previsit chart review, lab review, study review, order entry, electronic health record documentation, patient education and discussion regarding above diagnoses and treatment plan and answered all other questions to patient's satisfaction  Harlene Bogaert, [facility]  [facility] [address] [place], [place] [address]  Phone [phone] Fax [phone] Note: This document was prepared with digital dictation and possible smart phrase technology. Any transcriptional errors that result from this process are unintentional.

## 2023-11-12 ENCOUNTER — Encounter: Payer: Self-pay | Admitting: Adult Health

## 2023-11-12 ENCOUNTER — Ambulatory Visit (INDEPENDENT_AMBULATORY_CARE_PROVIDER_SITE_OTHER): Payer: 59 | Admitting: Adult Health

## 2023-11-12 VITALS — Ht 67.0 in | Wt 244.0 lb

## 2023-11-12 DIAGNOSIS — G43019 Migraine without aura, intractable, without status migrainosus: Secondary | ICD-10-CM

## 2023-11-12 MED ORDER — EMGALITY 120 MG/ML ~~LOC~~ SOAJ: 240.0000 mg | Freq: Once | SUBCUTANEOUS | 0 refills | Status: AC

## 2023-11-12 MED ORDER — EMGALITY 120 MG/ML ~~LOC~~ SOAJ: 120.0000 mg | SUBCUTANEOUS | 11 refills | Status: DC

## 2023-11-12 NOTE — Patient Instructions (Signed)
 Your Plan:  Start Emgality  monthly injection - first injection will be 2 injections. You will only do 1 per month after that  Continue amitriptyline  75mg  nightly  Continue Nurtec as needed     Follow up in 6 months or call earlier if needed     Thank you for coming to see us  at [facility]. I hope we have been able to provide you high quality care today.  You may receive a patient satisfaction survey over the next few weeks. We would appreciate your feedback and comments so that we may continue to improve ourselves and the health of our patients.

## 2023-11-24 ENCOUNTER — Ambulatory Visit (INDEPENDENT_AMBULATORY_CARE_PROVIDER_SITE_OTHER): Payer: Medicare Other

## 2023-11-24 DIAGNOSIS — I495 Sick sinus syndrome: Secondary | ICD-10-CM | POA: Diagnosis not present

## 2023-11-30 ENCOUNTER — Other Ambulatory Visit: Payer: Self-pay

## 2023-11-30 ENCOUNTER — Emergency Department (HOSPITAL_COMMUNITY): Payer: 59

## 2023-11-30 ENCOUNTER — Emergency Department (HOSPITAL_COMMUNITY)
Admission: EM | Admit: 2023-11-30 | Discharge: 2023-12-01 | Disposition: A | Discharge status: Home / Self Care | Payer: 59 | Attending: Emergency Medicine | Admitting: Emergency Medicine

## 2023-11-30 ENCOUNTER — Encounter (HOSPITAL_COMMUNITY): Payer: Self-pay | Admitting: Emergency Medicine

## 2023-11-30 DIAGNOSIS — R079 Chest pain, unspecified: Secondary | ICD-10-CM | POA: Diagnosis not present

## 2023-11-30 DIAGNOSIS — Z950 Presence of cardiac pacemaker: Secondary | ICD-10-CM | POA: Diagnosis not present

## 2023-11-30 DIAGNOSIS — Z7901 Long term (current) use of anticoagulants: Secondary | ICD-10-CM | POA: Insufficient documentation

## 2023-11-30 DIAGNOSIS — Z5321 Procedure and treatment not carried out due to patient leaving prior to being seen by health care provider: Secondary | ICD-10-CM | POA: Insufficient documentation

## 2023-11-30 DIAGNOSIS — I700 Atherosclerosis of aorta: Secondary | ICD-10-CM | POA: Insufficient documentation

## 2023-11-30 DIAGNOSIS — I495 Sick sinus syndrome: Secondary | ICD-10-CM | POA: Diagnosis not present

## 2023-11-30 DIAGNOSIS — E261 Secondary hyperaldosteronism: Secondary | ICD-10-CM | POA: Diagnosis not present

## 2023-11-30 DIAGNOSIS — R0789 Other chest pain: Secondary | ICD-10-CM | POA: Diagnosis not present

## 2023-11-30 DIAGNOSIS — I509 Heart failure, unspecified: Secondary | ICD-10-CM | POA: Insufficient documentation

## 2023-11-30 DIAGNOSIS — M542 Cervicalgia: Secondary | ICD-10-CM | POA: Diagnosis not present

## 2023-11-30 DIAGNOSIS — R42 Dizziness and giddiness: Secondary | ICD-10-CM | POA: Insufficient documentation

## 2023-11-30 DIAGNOSIS — E782 Mixed hyperlipidemia: Secondary | ICD-10-CM | POA: Diagnosis not present

## 2023-11-30 DIAGNOSIS — D6869 Other thrombophilia: Secondary | ICD-10-CM | POA: Diagnosis not present

## 2023-11-30 DIAGNOSIS — E1142 Type 2 diabetes mellitus with diabetic polyneuropathy: Secondary | ICD-10-CM | POA: Diagnosis not present

## 2023-11-30 LAB — CUP PACEART REMOTE DEVICE CHECK
Battery Remaining Longevity: 36 mo
Battery Remaining Percentage: 26 %
Battery Voltage: 2.92 V
Brady Statistic AP VP Percent: 1 %
Brady Statistic AP VS Percent: 1 %
Brady Statistic AS VP Percent: 1.4 %
Brady Statistic AS VS Percent: 98 %
Brady Statistic RA Percent Paced: 1 %
Brady Statistic RV Percent Paced: 1.6 %
Date Time Interrogation Session: 20250121020015
Implantable Lead Connection Status: 753985
Implantable Lead Connection Status: 753985
Implantable Lead Implant Date: 20160913
Implantable Lead Implant Date: 20160913
Implantable Lead Location: 753859
Implantable Lead Location: 753860
Implantable Lead Model: 1948
Implantable Pulse Generator Implant Date: 20160913
Lead Channel Impedance Value: 340 Ohm
Lead Channel Impedance Value: 590 Ohm
Lead Channel Pacing Threshold Amplitude: 0.75 V
Lead Channel Pacing Threshold Amplitude: 1.125 V
Lead Channel Pacing Threshold Pulse Width: 0.4 ms
Lead Channel Pacing Threshold Pulse Width: 0.4 ms
Lead Channel Sensing Intrinsic Amplitude: 2.2 mV
Lead Channel Sensing Intrinsic Amplitude: 2.5 mV
Lead Channel Setting Pacing Amplitude: 1.375
Lead Channel Setting Pacing Amplitude: 1.75 V
Lead Channel Setting Pacing Pulse Width: 0.4 ms
Lead Channel Setting Sensing Sensitivity: 0.7 mV
Pulse Gen Model: 2240
Pulse Gen Serial Number: 7676319

## 2023-11-30 LAB — BASIC METABOLIC PANEL
Anion gap: 7 (ref 5–15)
BUN: 15 mg/dL (ref 6–20)
CO2: 24 mmol/L (ref 22–32)
Calcium: 9.1 mg/dL (ref 8.9–10.3)
Chloride: 108 mmol/L (ref 98–111)
Creatinine, Ser: 0.85 mg/dL (ref 0.44–1.00)
GFR, Estimated: >60 mL/min (ref >60)
Glucose, Bld: 73 mg/dL (ref 70–99)
Potassium: 3.8 mmol/L (ref 3.5–5.1)
Sodium: 139 mmol/L (ref 135–145)

## 2023-11-30 LAB — CBC
HCT: 38.7 % (ref 36.0–46.0)
Hemoglobin: 12 g/dL (ref 12.0–15.0)
MCH: 28.4 pg (ref 26.0–34.0)
MCHC: 31 g/dL (ref 30.0–36.0)
MCV: 91.5 fL (ref 80.0–100.0)
Platelets: 265 10*3/uL (ref 150–400)
RBC: 4.23 MIL/uL (ref 3.87–5.11)
RDW: 13.2 % (ref 11.5–15.5)
WBC: 6.3 10*3/uL (ref 4.0–10.5)
nRBC: 0 % (ref 0.0–0.2)

## 2023-11-30 LAB — MAGNESIUM: Magnesium: 2 mg/dL (ref 1.7–2.4)

## 2023-11-30 LAB — BRAIN NATRIURETIC PEPTIDE: B Natriuretic Peptide: 18.5 pg/mL (ref 0.0–100.0)

## 2023-11-30 LAB — TROPONIN I (HIGH SENSITIVITY): Troponin I (High Sensitivity): 8 ng/L (ref <18)

## 2023-11-30 NOTE — ED Notes (Signed)
Unable to interrogate pacemaker. Interrogator device broken.

## 2023-11-30 NOTE — ED Provider Triage Note (Cosign Needed)
Emergency Medicine Provider Triage Evaluation Note  Sophia Wilkinson , a 58 y.o. female  was evaluated in triage.  Pt complains of chest pain dizziness that began the respiratory arrival.  Patient has a pacemaker however denies this going off.  Patient states that she still has substernal chest pain that does not radiate.  Patient has back pain neck pain or vision changes.  Patient states this occurred when she was trying to put on close.  Patient is on Eliquis and has not missed any doses of this.  Patient denies new onset weakness abdominal pain.  Review of Systems  Positive:  Negative:   Physical Exam  There were no vitals taken for this visit. Gen:   Awake, no distress   Resp:  Normal effort  MSK:   Moves extremities without difficulty  Other:  Lungs clear to auscultation bilaterally  Medical Decision Making  Medically screening exam initiated at 9:42 PM.  Appropriate orders placed.  HARLYNN KIMBELL was informed that the remainder of the evaluation will be completed by another provider, this initial triage assessment does not replace that evaluation, and the importance of remaining in the ED until their evaluation is complete.  Workup initiated, patient stable at this time   Remi Deter 11/30/23 2143

## 2023-11-30 NOTE — ED Notes (Signed)
Once pt is roomed, please call Merlin Rep Thayer Ohm [phone]) to have him walk RN through interrogation process.

## 2023-11-30 NOTE — ED Notes (Signed)
Secretary calling to see how long it will take a [facility] rep to come

## 2023-11-30 NOTE — ED Triage Notes (Signed)
Patient c/o Chest pain tonight. Patient report palpitations and SOB while laying down. Patient denies N/V. Patient repor taking Blood thinner . Hx of pacemaker.

## 2023-12-01 NOTE — ED Notes (Signed)
Pt reports she wants to leave, IV removed by RN, pt educated on staying but refused at this time r/t wait time

## 2023-12-02 ENCOUNTER — Encounter: Payer: Self-pay | Admitting: Cardiology

## 2023-12-02 ENCOUNTER — Ambulatory Visit: Payer: 59 | Attending: Cardiology | Admitting: Cardiology

## 2023-12-02 VITALS — BP 104/62 | HR 66 | Ht 66.0 in | Wt 246.6 lb

## 2023-12-02 DIAGNOSIS — E119 Type 2 diabetes mellitus without complications: Secondary | ICD-10-CM | POA: Diagnosis not present

## 2023-12-02 DIAGNOSIS — I5032 Chronic diastolic (congestive) heart failure: Secondary | ICD-10-CM

## 2023-12-02 DIAGNOSIS — I495 Sick sinus syndrome: Secondary | ICD-10-CM | POA: Diagnosis not present

## 2023-12-02 DIAGNOSIS — I480 Paroxysmal atrial fibrillation: Secondary | ICD-10-CM | POA: Diagnosis not present

## 2023-12-02 DIAGNOSIS — I422 Other hypertrophic cardiomyopathy: Secondary | ICD-10-CM | POA: Diagnosis not present

## 2023-12-02 MED ORDER — METOPROLOL SUCCINATE ER 25 MG PO TB24: 50.0000 mg | ORAL_TABLET | Freq: Every day | ORAL | Status: DC

## 2023-12-02 NOTE — Progress Notes (Signed)
Cardiology Office Note:    Date:  12/02/2023   ID:  Sophia Wilkinson, DOB Apr 05, 1966, MRN [id number]  PCP:  Sophia Hazel, MD   [facility] Providers Cardiologist:  Sophia Red, MD     Referring MD: Sophia Hazel, MD   Chief Complaint  Patient presents with   Follow-up   History of Present Illness:    Sophia Wilkinson is a 58 y.o. female with a hx of hypertrophic cardiomyopathy, type II diabetes, hypertension, hyperlipidemia, obesity, OSA on CPAP, and bradycardia s/p PPM who is seen in follow-up today for chest pain.   Sophia Wilkinson is followed by Sophia Wilkinson for her cardiology care. She was initially seen as a new consult at the request of Sophia Wilkinson for evaluation and management of heart failure on 06/18/18. She previously followed with [facility] prior to moving to [place]. She was seen in the ED yesterday after an episode of dizziness, palpitations, and chest pressure. Out of concern, her daughter made her go to the ED. EKG and labs reviewed with no acute issues. Due to long wait times, she left AMA and called for an appointment today.   She has had no recurrent episodes since she left the hospital. Denies SOB,  LE edema, orthopnea, PND, dizziness, or syncope. Denies bleeding in stool or urine. Last year she was Dx with AF by remote device interrogation and was started on Eliquis and Toprol increased to 75mg  daily.   Past Medical History:  Diagnosis Date   Allergy    Anemia    Atrial fibrillation (HCC) 02/2023   on eliquis 5mg  BID   Blood transfusion without reported diagnosis    Bradycardia    Cancer (HCC)    Cardiomyopathy (HCC)    CHF (congestive heart failure) (HCC)    Chronic pain    Diabetes mellitus without complication (HCC)    GERD (gastroesophageal reflux disease)    Heart murmur    Hyperlipidemia    Migraines    Sleep apnea     Past Surgical History:  Procedure Laterality Date   ABDOMINAL HYSTERECTOMY  2016   HERNIA REPAIR      LAPAROSCOPY ABDOMEN DIAGNOSTIC     LEEP     PACEMAKER IMPLANT  2016   TUBAL LIGATION  1999    Current Medications: Current Meds  Medication Sig   acetaminophen (TYLENOL) 650 MG CR tablet Take 650 mg by mouth 2 (two) times daily.   albuterol (VENTOLIN HFA) 108 (90 Base) MCG/ACT inhaler Inhale 2 puffs into the lungs every 4 (four) hours as needed for wheezing or shortness of breath.   amitriptyline (ELAVIL) 25 MG tablet TAKE 2 TABLETS BY MOUTH AT BEDTIME   apixaban (ELIQUIS) 5 MG TABS tablet Take 1 tablet (5 mg total) by mouth 2 (two) times daily.   candesartan (ATACAND) 4 MG tablet Take 0.5 tablets (2 mg total) by mouth daily. Please contact PCP for subsequent refills.   chlorpheniramine (CHLOR-TRIMETON) 4 MG tablet Take 4 mg by mouth 2 (two) times daily as needed for allergies.   diclofenac Sodium (VOLTAREN) 1 % GEL Apply 2 g topically as needed.   doxycycline (VIBRAMYCIN) 100 MG capsule Take 1 capsule (100 mg total) by mouth 2 (two) times daily.   famotidine (PEPCID) 20 MG tablet Take 20 mg by mouth daily.   fluticasone-salmeterol (ADVAIR) 250-50 MCG/ACT AEPB Inhale 1 puff into the lungs in the morning and at bedtime.   gabapentin (NEURONTIN) 300 MG capsule Take 300 mg by mouth  3 (three) times daily.   Galcanezumab-gnlm (EMGALITY) 120 MG/ML SOAJ Inject 120 mg into the skin every 30 (thirty) days.   metoprolol succinate (TOPROL XL) 25 MG 24 hr tablet Take 2 tablets (50 mg total) by mouth daily.   montelukast (SINGULAIR) 10 MG tablet Take 10 mg by mouth at bedtime.   potassium chloride (KLOR-CON) 10 MEQ tablet Take 10 mEq by mouth as needed (with the fluid pill).   Rimegepant Sulfate (NURTEC) 75 MG TBDP Take 1 tablet (75 mg total) by mouth as needed (for migraine). Max dose 1 pill in 24 hours   rosuvastatin (CRESTOR) 20 MG tablet Take 20 mg by mouth daily.   senna-docusate (SENOKOT-S) 8.6-50 MG tablet Take 1 tablet by mouth every morning.   spironolactone (ALDACTONE) 25 MG tablet Take 1/2  (one-half) tablet by mouth once daily   tirzepatide (MOUNJARO) 10 MG/0.5ML Pen Inject 10 mg into the skin once a week.   torsemide (DEMADEX) 10 MG tablet Take 1 tablet (10 mg total) by mouth daily as needed (weight gain 3 lbs overnight or 5 lbs in a week).   Vitamin D, Ergocalciferol, (DRISDOL) 1.25 MG (50000 UNIT) CAPS capsule Take 50,000 Units by mouth once a week.   [DISCONTINUED] metoprolol succinate (TOPROL-XL) 25 MG 24 hr tablet Take 3 tablets (75 mg total) by mouth daily.     Allergies:   Metformin   Social History   Socioeconomic History   Marital status: Single    Spouse name: Not on file   Number of children: 5   Years of education: 4 year college   Highest education level: Bachelor's degree (e.g., BA, AB, BS)  Occupational History   Not on file  Tobacco Use   Smoking status: Never    Passive exposure: Past   Smokeless tobacco: Never  Vaping Use   Vaping status: Never Used  Substance and Sexual Activity   Alcohol use: Never   Drug use: Never   Sexual activity: Not on file  Other Topics Concern   Not on file  Social History Narrative   Lives with daughter and two grandkids in a manufactured home. Has steps into home (3). No pets. Likes to spend time with grandkids, reading, computer. Has smoke alarms, no throw rugs, no cords across floors. No grab bars.   Social Drivers of Health   Financial Resource Strain: Medium Risk (04/02/2023)   Overall Financial Resource Strain (CARDIA)    Difficulty of Paying Living Expenses: Somewhat hard  Food Insecurity: No Food Insecurity (04/02/2023)   Hunger Vital Sign    Worried About Running Out of Food in the Last Year: Never true    Ran Out of Food in the Last Year: Never true  Transportation Needs: No Transportation Needs (04/02/2023)   PRAPARE - Administrator, Civil Service (Medical): No    Lack of Transportation (Non-Medical): No  Physical Activity: Sufficiently Active (07/14/2018)   Exercise Vital Sign    Days of  Exercise per Week: 7 days    Minutes of Exercise per Session: 60 min  Stress: Stress Concern Present (04/02/2023)   [employer] of Occupational Health - Occupational Stress Questionnaire    Feeling of Stress : Rather much  Social Connections: Moderately Integrated (07/14/2018)   Social Connection and Isolation Panel [NHANES]    Frequency of Communication with Friends and Family: More than three times a week    Frequency of Social Gatherings with Friends and Family: More than three times a week  Attends Religious Services: More than 4 times per year    Active Member of Clubs or Organizations: Yes    Attends Banker Meetings: 1 to 4 times per year    Marital Status: Never married     Family History: The patient's family history includes Alcohol abuse in her father, mother, and sister; Diabetes in her daughter, maternal grandmother, and sister; Drug abuse in her father; Heart disease in her maternal grandmother and paternal grandmother; Hyperlipidemia in her maternal grandmother; Hypertension in her daughter; Liver disease in her mother. There is no history of Colon cancer, Esophageal cancer, Rectal cancer, or Stomach cancer.  ROS:   Please see the history of present illness.     All other systems reviewed and are negative.   EKG: 11/30/23 NST with HR 71bpm  Recent Labs: 11/30/2023: B Natriuretic Peptide 18.5; BUN 15; Creatinine, Ser 0.85; Hemoglobin 12.0; Magnesium 2.0; Platelets 265; Potassium 3.8; Sodium 139  Recent Lipid Panel    Component Value Date/Time   CHOL 204 (H) 06/06/2020 1000   TRIG 77 06/06/2020 1000   HDL 39 (L) 06/06/2020 1000   CHOLHDL 5.2 (H) 06/06/2020 1000   LDLCALC 151 (H) 06/06/2020 1000    Risk Assessment/Calculations:    CHA2DS2-VASc Score = 3   This indicates a 3.2% annual risk of stroke. The patient's score is based upon: CHF History: 0 HTN History: 1 Diabetes History: 0 Stroke History: 0 Vascular Disease History: 1 Age Score:  0 Gender Score: 1      Physical Exam:    VS:  BP 104/62   Pulse 66   Ht 5\' 6"  (1.676 m)   Wt 246 lb 9.6 oz (111.9 kg)   SpO2 97%   BMI 39.80 kg/m     Wt Readings from Last 3 Encounters:  12/02/23 246 lb 9.6 oz (111.9 kg)  11/30/23 244 lb 11.4 oz (111 kg)  11/12/23 244 lb (110.7 kg)    General: Well developed, well nourished, NAD Lungs:Clear to ausculation bilaterally. No wheezes, rales, or rhonchi. Breathing is unlabored. Cardiovascular: RRR with S1 S2. No murmurs Extremities: No edema.  Neuro: Alert and oriented. No focal deficits. No facial asymmetry. MAE spontaneously. Psych: Responds to questions appropriately with normal affect.    ASSESSMENT/PLAN:    Chest pressure/palpitations with hx of HCM: Echo stress test from 2019 with LVEF at 55% with no outflow tract obstruction who presents for evaluation after being seen in the ED yesterday for palpitations, dizziness, and chest pressure. EKG with NSR 1/27 and normal per ausculation today. Labs unremarkable. Plan to reduce Toprol to 50mg  daily and obtain echocardiogram for evaluation of HCM.   HTN: Soft today with reports of dizziness. Reduce Toprol to 50mg  daily and follow after echo performed.  PAF: Dx on device interrogation last year. Last interrogation 1/27. Will have device clinic update and assess for PAF burden. Continue Eliquis, Torpol.   Bradycardia: s/p PPM followed by Dr. Royann Shivers. Will have remote interrogation to observe PAF burden.   HLD: Continue statin     Medication Adjustments/Labs and Tests Ordered: Current medicines are reviewed at length with the patient today.  Concerns regarding medicines are outlined above.  Orders Placed This Encounter  Procedures   ECHOCARDIOGRAM COMPLETE   Meds ordered this encounter  Medications   metoprolol succinate (TOPROL XL) 25 MG 24 hr tablet    Sig: Take 2 tablets (50 mg total) by mouth daily.    Patient Instructions  Medication Instructions:  Your physician has  recommended you make the following change in your medication:   REDUCE the Toprol to 2 tablets daily   *If you need a refill on your cardiac medications before your next appointment, please call your pharmacy*   Lab Work: None ordered  If you have labs (blood work) drawn today and your tests are completely normal, you will receive your results only by: MyChart Message (if you have MyChart) OR A paper copy in the mail If you have any lab test that is abnormal or we need to change your treatment, we will call you to review the results.   Testing/Procedures: Your physician has requested that you have an echocardiogram. Echocardiography is a painless test that uses sound waves to create images of your heart. It provides your doctor with information about the size and shape of your heart and how well your heart's chambers and valves are working. This procedure takes approximately one hour. There are no restrictions for this procedure. Please do NOT wear cologne, perfume, aftershave, or lotions (deodorant is allowed). Please arrive 15 minutes prior to your appointment time.  Please note: We ask at that you not bring children with you during ultrasound (echo/ vascular) testing. Due to room size and safety concerns, children are not allowed in the ultrasound rooms during exams. Our front office staff cannot provide observation of children in our lobby area while testing is being conducted. An adult accompanying a patient to their appointment will only be allowed in the ultrasound room at the discretion of the ultrasound technician under special circumstances. We apologize for any inconvenience.    Follow-Up: At [facility], you and your health needs are our priority.  As part of our continuing mission to provide you with exceptional heart care, we have created designated Provider Care Teams.  These Care Teams include your primary Cardiologist (physician) and Advanced Practice Providers  (APPs -  Physician Assistants and Nurse Practitioners) who all work together to provide you with the care you need, when you need it.  We recommend signing up for the patient portal called "MyChart".  Sign up information is provided on this After Visit Summary.  MyChart is used to connect with patients for Virtual Visits (Telemedicine).  Patients are able to view lab/test results, encounter notes, upcoming appointments, etc.  Non-urgent messages can be sent to your provider as well.   To learn more about what you can do with MyChart, go to [email or web address].    Your next appointment:   After Echo  Provider:   Dr. Cristal Wilkinson or Dr. Royann Shivers     Other Instructions   1st Floor: - Lobby - Registration  - Pharmacy  - Lab - Cafe  2nd Floor: - PV Lab - Diagnostic Testing (echo, CT, nuclear med)  3rd Floor: - Vacant  4th Floor: - TCTS (cardiothoracic surgery) - AFib Clinic - Structural Heart Clinic - Vascular Surgery  - Vascular Ultrasound  5th Floor: - HeartCare Cardiology (general and EP) - Clinical Pharmacy for coumadin, hypertension, lipid, weight-loss medications, and med management appointments    Valet parking services will be available as well.           Signed, Georgie Chard, NP  12/02/2023 1:03 PM    [facility]

## 2023-12-02 NOTE — Patient Instructions (Signed)
Medication Instructions:  Your physician has recommended you make the following change in your medication:   REDUCE the Toprol to 2 tablets daily   *If you need a refill on your cardiac medications before your next appointment, please call your pharmacy*   Lab Work: None ordered  If you have labs (blood work) drawn today and your tests are completely normal, you will receive your results only by: MyChart Message (if you have MyChart) OR A paper copy in the mail If you have any lab test that is abnormal or we need to change your treatment, we will call you to review the results.   Testing/Procedures: Your physician has requested that you have an echocardiogram. Echocardiography is a painless test that uses sound waves to create images of your heart. It provides your doctor with information about the size and shape of your heart and how well your heart's chambers and valves are working. This procedure takes approximately one hour. There are no restrictions for this procedure. Please do NOT wear cologne, perfume, aftershave, or lotions (deodorant is allowed). Please arrive 15 minutes prior to your appointment time.  Please note: We ask at that you not bring children with you during ultrasound (echo/ vascular) testing. Due to room size and safety concerns, children are not allowed in the ultrasound rooms during exams. Our front office staff cannot provide observation of children in our lobby area while testing is being conducted. An adult accompanying a patient to their appointment will only be allowed in the ultrasound room at the discretion of the ultrasound technician under special circumstances. We apologize for any inconvenience.    Follow-Up: At [facility], you and your health needs are our priority.  As part of our continuing mission to provide you with exceptional heart care, we have created designated Provider Care Teams.  These Care Teams include your primary Cardiologist  (physician) and Advanced Practice Providers (APPs -  Physician Assistants and Nurse Practitioners) who all work together to provide you with the care you need, when you need it.  We recommend signing up for the patient portal called "MyChart".  Sign up information is provided on this After Visit Summary.  MyChart is used to connect with patients for Virtual Visits (Telemedicine).  Patients are able to view lab/test results, encounter notes, upcoming appointments, etc.  Non-urgent messages can be sent to your provider as well.   To learn more about what you can do with MyChart, go to [email or web address].    Your next appointment:   After Echo  Provider:   Dr. Cristal Deer or Dr. Royann Shivers     Other Instructions   1st Floor: - Lobby - Registration  - Pharmacy  - Lab - Cafe  2nd Floor: - PV Lab - Diagnostic Testing (echo, CT, nuclear med)  3rd Floor: - Vacant  4th Floor: - TCTS (cardiothoracic surgery) - AFib Clinic - Structural Heart Clinic - Vascular Surgery  - Vascular Ultrasound  5th Floor: - HeartCare Cardiology (general and EP) - Clinical Pharmacy for coumadin, hypertension, lipid, weight-loss medications, and med management appointments    Valet parking services will be available as well.

## 2023-12-05 ENCOUNTER — Encounter: Payer: Self-pay | Admitting: Cardiovascular Disease

## 2023-12-05 DIAGNOSIS — G4733 Obstructive sleep apnea (adult) (pediatric): Secondary | ICD-10-CM | POA: Diagnosis not present

## 2023-12-09 DIAGNOSIS — E261 Secondary hyperaldosteronism: Secondary | ICD-10-CM | POA: Diagnosis not present

## 2023-12-09 DIAGNOSIS — E782 Mixed hyperlipidemia: Secondary | ICD-10-CM | POA: Diagnosis not present

## 2023-12-09 DIAGNOSIS — E1142 Type 2 diabetes mellitus with diabetic polyneuropathy: Secondary | ICD-10-CM | POA: Diagnosis not present

## 2023-12-09 DIAGNOSIS — I5030 Unspecified diastolic (congestive) heart failure: Secondary | ICD-10-CM | POA: Diagnosis not present

## 2023-12-09 DIAGNOSIS — I422 Other hypertrophic cardiomyopathy: Secondary | ICD-10-CM | POA: Diagnosis not present

## 2023-12-11 ENCOUNTER — Other Ambulatory Visit (HOSPITAL_BASED_OUTPATIENT_CLINIC_OR_DEPARTMENT_OTHER): Payer: Self-pay | Admitting: Cardiology

## 2023-12-23 ENCOUNTER — Ambulatory Visit (HOSPITAL_COMMUNITY): Payer: 59 | Attending: Cardiology

## 2023-12-23 DIAGNOSIS — I422 Other hypertrophic cardiomyopathy: Secondary | ICD-10-CM | POA: Diagnosis not present

## 2023-12-23 LAB — ECHOCARDIOGRAM COMPLETE
Area-P 1/2: 3.78 cm2
S' Lateral: 2.4 cm

## 2023-12-28 ENCOUNTER — Encounter (HOSPITAL_BASED_OUTPATIENT_CLINIC_OR_DEPARTMENT_OTHER): Payer: Self-pay | Admitting: Cardiology

## 2023-12-28 ENCOUNTER — Ambulatory Visit (HOSPITAL_BASED_OUTPATIENT_CLINIC_OR_DEPARTMENT_OTHER): Payer: 59 | Admitting: Cardiology

## 2023-12-28 VITALS — BP 116/78 | HR 64 | Ht 66.0 in | Wt 249.3 lb

## 2023-12-28 DIAGNOSIS — I495 Sick sinus syndrome: Secondary | ICD-10-CM

## 2023-12-28 DIAGNOSIS — F439 Reaction to severe stress, unspecified: Secondary | ICD-10-CM

## 2023-12-28 DIAGNOSIS — I480 Paroxysmal atrial fibrillation: Secondary | ICD-10-CM

## 2023-12-28 DIAGNOSIS — I422 Other hypertrophic cardiomyopathy: Secondary | ICD-10-CM

## 2023-12-28 DIAGNOSIS — E119 Type 2 diabetes mellitus without complications: Secondary | ICD-10-CM

## 2023-12-28 MED ORDER — METOPROLOL SUCCINATE ER 50 MG PO TB24
50.0000 mg | ORAL_TABLET | Freq: Every day | ORAL | 3 refills | Status: AC
Start: 2023-12-28 — End: 2024-12-22

## 2023-12-28 NOTE — Progress Notes (Signed)
 Cardiology Office Note:  .   Date:  12/28/2023  ID:  Sophia Wilkinson, DOB [birth year], MRN [id number] PCP: Sigmund Hazel, MD  [facility] Providers Cardiologist:  Jodelle Red, MD {  History of Present Illness: .   Sophia Wilkinson is a 58 y.o. female with a hx of hypertrophic cardiomyopathy, type II diabetes, hypertension, hyperlipidemia who is seen in follow-up today. She was initially seen as a new consult at the request of Dr. Linwood Dibbles for evaluation and management of heart failure on 06/18/18.   Pertinent CV history: She had been followed by [facility] until moving to [facility] 11/2017. She denies family history of sudden cardiac death. No syncope. No known history of NSVT/VT. Last [facility] note summarized in initial consult note.   Today: We reviewed her most recent echo. This showed EF 65-70%, no evidence of obstruction by gradients at rest or with valsalva.  Has been having intermittent palpitations/lightheadedness, hot flashes. Went to ER for this 11/30/23 but left before being seen. Reviewed her most recent interrogation, no significant episodes of high ventricular rate noted.   Asking about if she needs a defibrillator lead. We reviewed criteria for this, which at this time she does not meet. Still several years out from a gen change.  Still dealing with family stress, working through legal issues for custody of her grandchildren after her daughter and son passed away.   Has chest pain randomly a few times a week. Random, nonexertional, self limited. Pain is sharp, localized to mid chest, sometimes radiating to the right side, not associated with shortness of breath.  Has chronic headaches, followed by neurology.   ROS: Denies shortness of breath at rest or with normal exertion. No PND, orthopnea, LE edema or unexpected weight gain. No syncope or palpitations. ROS otherwise negative except as noted.   Studies Reviewed: Marland Kitchen    EKG:       Physical Exam:   VS:  BP  116/78   Pulse 64   Ht 5\' 6"  (1.676 m)   Wt 249 lb 4.8 oz (113.1 kg)   SpO2 99%   BMI 40.24 kg/m    Wt Readings from Last 3 Encounters:  12/28/23 249 lb 4.8 oz (113.1 kg)  12/02/23 246 lb 9.6 oz (111.9 kg)  11/30/23 244 lb 11.4 oz (111 kg)    GEN: Well nourished, well developed in no acute distress HEENT: Normal, moist mucous membranes NECK: No JVD CARDIAC: regular rhythm, normal S1 and S2, no rubs or gallops. No murmur. VASCULAR: Radial and DP pulses 2+ bilaterally. No carotid bruits RESPIRATORY:  Clear to auscultation without rales, wheezing or rhonchi  ABDOMEN: Soft, non-tender, non-distended MUSCULOSKELETAL:  Ambulates independently SKIN: Warm and dry, no edema NEUROLOGIC:  Alert and oriented x 3. No focal neuro deficits noted. PSYCHIATRIC:  Normal affect    ASSESSMENT AND PLAN: .    Stress: under severe family stress currently. We have offered assistance in whatever way we can.   Paroxysmal atrial fibrillation -with HOCM, needs anticoagulation, tolerating well -no bleeding issues   Hypertrophic cardiomyopathy Chronic diastolic heart failure Hypertension Type II diabetes -no red flags of syncope or family history of SCD  -unable to hear murmur today, no gradient on recent echo -continue metoprolol succinate 50 mg daily (decreased from 75 mg dose) -euvolemic today -continue empagliflozin -torsemide PRN, has not required -continue candesartan 2 mg daily, spironolactone 12.5 mg daily. If BP continues to decrease, would cut back on candesartan first -doing well on GLP1RA  Morbid obesity: on GLP1RA, see above   Hyperlipidemia: -continue rousvastatin   intermittent atypical chest pain -No suggestion of ischemia or obstruction on prior stress test.  -counseled on red flag warning signs that need immediate medical attention   Nutrition and exercise counseling -recommend heart healthy/Mediterranean diet, with whole grains, fruits, vegetable, fish, lean meats, nuts,  and olive oil. Limit salt. -recommend moderate walking, 3-5 times/week for 30-50 minutes each session. Aim for at least 150 minutes.week. Goal should be pace of 3 miles/hours, or walking 1.5 miles in 30 minutes    Not addressed in depth today: Pacemaker: followed by Dr. Royann Shivers  Dispo: 6 mos or sooner as needed  Signed, Jodelle Red, MD   Jodelle Red, MD, PhD, [facility]  [facility]  [place] at [facility] at [facility] [address] [place], [place] [address] [phone]

## 2023-12-28 NOTE — Patient Instructions (Signed)
 Medication Instructions:  Your physician recommends that you continue on your current medications as directed. Please refer to the Current Medication list given to you today.  Follow-Up: At [facility], you and your health needs are our priority.  As part of our continuing mission to provide you with exceptional heart care, we have created designated Provider Care Teams.  These Care Teams include your primary Cardiologist (physician) and Advanced Practice Providers (APPs -  Physician Assistants and Nurse Practitioners) who all work together to provide you with the care you need, when you need it.  We recommend signing up for the patient portal called "MyChart".  Sign up information is provided on this After Visit Summary.  MyChart is used to connect with patients for Virtual Visits (Telemedicine).  Patients are able to view lab/test results, encounter notes, upcoming appointments, etc.  Non-urgent messages can be sent to your provider as well.   To learn more about what you can do with MyChart, go to [email or web address].    Your next appointment:   6 month(s)  Provider:   Jodelle Red, MD

## 2024-01-04 NOTE — Progress Notes (Signed)
 Remote pacemaker transmission.

## 2024-01-04 NOTE — Addendum Note (Signed)
 Addended by: Geralyn Flash D on: 01/04/2024 04:07 PM   Modules accepted: Orders

## 2024-01-26 ENCOUNTER — Telehealth: Payer: Self-pay

## 2024-01-26 ENCOUNTER — Other Ambulatory Visit (HOSPITAL_COMMUNITY): Payer: Self-pay

## 2024-01-26 NOTE — Telephone Encounter (Signed)
 Pharmacy Patient Advocate Encounter  Received notification from [facility] that Prior Authorization for Emgality 120MG /ML auto-injectors (migraine) has been APPROVED from 01/26/2024 to 11/02/2024. Ran test claim, Copay is $0. This test claim was processed through [facility]- copay amounts may vary at other pharmacies due to pharmacy/plan contracts, or as the patient moves through the different stages of their insurance plan.   PA #/Case ID/Reference #: [id number]

## 2024-01-26 NOTE — Telephone Encounter (Signed)
 Pharmacy Patient Advocate Encounter   Received notification from Fax that prior authorization for Emgality 120MG /ML auto-injectors (migraine) is required/requested.   Insurance verification completed.   The patient is insured through [facility] .   Per test claim: PA required; PA submitted to above mentioned insurance via CoverMyMeds Key/confirmation #/EOC [id number] Status is pending  Loading Dose

## 2024-01-29 DIAGNOSIS — G4733 Obstructive sleep apnea (adult) (pediatric): Secondary | ICD-10-CM | POA: Diagnosis not present

## 2024-02-01 DIAGNOSIS — G4733 Obstructive sleep apnea (adult) (pediatric): Secondary | ICD-10-CM | POA: Diagnosis not present

## 2024-02-14 ENCOUNTER — Other Ambulatory Visit: Payer: Self-pay | Admitting: Cardiovascular Disease

## 2024-02-15 NOTE — Telephone Encounter (Signed)
 Prescription refill request for Eliquis received. Indication:afib Last office visit:2/25 Scr:0.85  1/25 Age: 58 Weight:113.1  kg  Prescription refilled

## 2024-02-16 DIAGNOSIS — E782 Mixed hyperlipidemia: Secondary | ICD-10-CM | POA: Diagnosis not present

## 2024-02-16 DIAGNOSIS — E261 Secondary hyperaldosteronism: Secondary | ICD-10-CM | POA: Diagnosis not present

## 2024-02-16 DIAGNOSIS — E1142 Type 2 diabetes mellitus with diabetic polyneuropathy: Secondary | ICD-10-CM | POA: Diagnosis not present

## 2024-02-23 ENCOUNTER — Ambulatory Visit (INDEPENDENT_AMBULATORY_CARE_PROVIDER_SITE_OTHER): Payer: Medicare Other

## 2024-02-23 DIAGNOSIS — I495 Sick sinus syndrome: Secondary | ICD-10-CM | POA: Diagnosis not present

## 2024-02-24 LAB — CUP PACEART REMOTE DEVICE CHECK
Battery Remaining Longevity: 32 mo
Battery Remaining Percentage: 24 %
Battery Voltage: 2.9 V
Brady Statistic AP VP Percent: 1 %
Brady Statistic AP VS Percent: 1 %
Brady Statistic AS VP Percent: 1.3 %
Brady Statistic AS VS Percent: 98 %
Brady Statistic RA Percent Paced: 1 %
Brady Statistic RV Percent Paced: 1.5 %
Date Time Interrogation Session: 20250422034906
Implantable Lead Connection Status: 753985
Implantable Lead Connection Status: 753985
Implantable Lead Implant Date: 20160913
Implantable Lead Implant Date: 20160913
Implantable Lead Location: 753859
Implantable Lead Location: 753860
Implantable Lead Model: 1948
Implantable Pulse Generator Implant Date: 20160913
Lead Channel Impedance Value: 360 Ohm
Lead Channel Impedance Value: 590 Ohm
Lead Channel Pacing Threshold Amplitude: 0.75 V
Lead Channel Pacing Threshold Amplitude: 1.125 V
Lead Channel Pacing Threshold Pulse Width: 0.4 ms
Lead Channel Pacing Threshold Pulse Width: 0.4 ms
Lead Channel Sensing Intrinsic Amplitude: 2 mV
Lead Channel Sensing Intrinsic Amplitude: 3.1 mV
Lead Channel Setting Pacing Amplitude: 1.375
Lead Channel Setting Pacing Amplitude: 1.75 V
Lead Channel Setting Pacing Pulse Width: 0.4 ms
Lead Channel Setting Sensing Sensitivity: 0.7 mV
Pulse Gen Model: 2240
Pulse Gen Serial Number: 7676319

## 2024-03-03 DIAGNOSIS — G4733 Obstructive sleep apnea (adult) (pediatric): Secondary | ICD-10-CM | POA: Diagnosis not present

## 2024-03-04 DIAGNOSIS — R053 Chronic cough: Secondary | ICD-10-CM | POA: Diagnosis not present

## 2024-03-04 DIAGNOSIS — R197 Diarrhea, unspecified: Secondary | ICD-10-CM | POA: Diagnosis not present

## 2024-03-05 ENCOUNTER — Encounter: Payer: Self-pay | Admitting: Cardiovascular Disease

## 2024-03-09 DIAGNOSIS — R197 Diarrhea, unspecified: Secondary | ICD-10-CM | POA: Diagnosis not present

## 2024-04-07 NOTE — Addendum Note (Signed)
 Addended by: Lott Rouleau A on: 04/07/2024 02:59 PM   Modules accepted: Orders

## 2024-04-07 NOTE — Progress Notes (Signed)
 Remote pacemaker transmission.

## 2024-05-02 DIAGNOSIS — G4733 Obstructive sleep apnea (adult) (pediatric): Secondary | ICD-10-CM | POA: Diagnosis not present

## 2024-05-03 DIAGNOSIS — E782 Mixed hyperlipidemia: Secondary | ICD-10-CM | POA: Diagnosis not present

## 2024-05-03 DIAGNOSIS — E1142 Type 2 diabetes mellitus with diabetic polyneuropathy: Secondary | ICD-10-CM | POA: Diagnosis not present

## 2024-05-03 DIAGNOSIS — G4733 Obstructive sleep apnea (adult) (pediatric): Secondary | ICD-10-CM | POA: Diagnosis not present

## 2024-05-05 DIAGNOSIS — G4733 Obstructive sleep apnea (adult) (pediatric): Secondary | ICD-10-CM | POA: Diagnosis not present

## 2024-05-19 ENCOUNTER — Ambulatory Visit: Payer: Self-pay

## 2024-05-19 DIAGNOSIS — E119 Type 2 diabetes mellitus without complications: Secondary | ICD-10-CM | POA: Diagnosis not present

## 2024-05-19 DIAGNOSIS — M1712 Unilateral primary osteoarthritis, left knee: Secondary | ICD-10-CM | POA: Diagnosis not present

## 2024-05-24 ENCOUNTER — Ambulatory Visit: Payer: Medicare Other | Attending: Cardiovascular Disease

## 2024-05-24 DIAGNOSIS — I495 Sick sinus syndrome: Secondary | ICD-10-CM | POA: Diagnosis not present

## 2024-05-25 LAB — CUP PACEART REMOTE DEVICE CHECK
Battery Remaining Longevity: 30 mo
Battery Remaining Percentage: 22 %
Battery Voltage: 2.9 V
Brady Statistic AP VP Percent: 1 %
Brady Statistic AP VS Percent: 1 %
Brady Statistic AS VP Percent: 1.3 %
Brady Statistic AS VS Percent: 98 %
Brady Statistic RA Percent Paced: 1 %
Brady Statistic RV Percent Paced: 1.5 %
Date Time Interrogation Session: 20250722020012
Implantable Lead Connection Status: 753985
Implantable Lead Connection Status: 753985
Implantable Lead Implant Date: 20160913
Implantable Lead Implant Date: 20160913
Implantable Lead Location: 753859
Implantable Lead Location: 753860
Implantable Lead Model: 1948
Implantable Pulse Generator Implant Date: 20160913
Lead Channel Impedance Value: 400 Ohm
Lead Channel Impedance Value: 640 Ohm
Lead Channel Pacing Threshold Amplitude: 0.75 V
Lead Channel Pacing Threshold Amplitude: 0.875 V
Lead Channel Pacing Threshold Pulse Width: 0.4 ms
Lead Channel Pacing Threshold Pulse Width: 0.4 ms
Lead Channel Sensing Intrinsic Amplitude: 2.3 mV
Lead Channel Sensing Intrinsic Amplitude: 2.9 mV
Lead Channel Setting Pacing Amplitude: 1.125
Lead Channel Setting Pacing Amplitude: 1.75 V
Lead Channel Setting Pacing Pulse Width: 0.4 ms
Lead Channel Setting Sensing Sensitivity: 0.7 mV
Pulse Gen Model: 2240
Pulse Gen Serial Number: 7676319

## 2024-06-03 NOTE — Progress Notes (Signed)
 CC:  headaches  Follow-up Visit  Last visit: 11/12/2023  Brief HPI: 58 year old female with a history of DM2, HLD, OSA, CHF, bradycardia s/p pacemaker, asthma, cervical cancer 2021 who follows in clinic for migraines and neck pain.  CT C-spine 09/2022 mild encroachment of neural foramina from C4-C7 with no central spinal stenosis.  At her last visit, reported worsening migraines over the past several weeks therefore started on Emgality  in addition to amitriptyline  and continue Nurtec for rescue.  Interval History:  Returns today for follow-up visit.  She feels migraines overall well-controlled.  Reports about 4 to 5/month.  Never started on Emgality  as she had difficulty getting loading dose approved through insurance and did not know if she would be able to start Emgality  without the loading dose.  She remains on amitriptyline  75 mg nightly.  Use of Nurtec for rescue with benefit.  Has not been using CPAP due to issues with congestion after use.     Migraine days per month: 4-5   Current Headache Regimen: Preventative: amitriptyline  75 mg QHS Abortive: Nurtec 75 mg PRN   Prior Therapies                                  Amitriptyline  50 mg QHS Candesartan  2 mg daily Metoprolol  25 mg daily Gabapentin 300 mg TID Nurtec 75 mg PRN Neck PT - did not help Aimovig - contraindicated d/t constipation    Current Outpatient Medications on File Prior to Visit  Medication Sig Dispense Refill   acetaminophen (TYLENOL) 650 MG CR tablet Take 650 mg by mouth 2 (two) times daily.     albuterol  (VENTOLIN  HFA) 108 (90 Base) MCG/ACT inhaler Inhale 2 puffs into the lungs every 4 (four) hours as needed for wheezing or shortness of breath. 8 g 0   amitriptyline  (ELAVIL ) 25 MG tablet Take 75 mg by mouth at bedtime.     apixaban  (ELIQUIS ) 5 MG TABS tablet Take 1 tablet by mouth twice daily 180 tablet 1   candesartan  (ATACAND ) 4 MG tablet Take 0.5 tablets (2 mg total) by mouth daily. Please contact  PCP for subsequent refills. 15 tablet 0   chlorpheniramine (CHLOR-TRIMETON) 4 MG tablet Take 4 mg by mouth 2 (two) times daily as needed for allergies.     diclofenac  Sodium (VOLTAREN ) 1 % GEL Apply 2 g topically as needed.     famotidine (PEPCID) 20 MG tablet Take 20 mg by mouth daily.     gabapentin (NEURONTIN) 300 MG capsule Take 300 mg by mouth 3 (three) times daily.     metoprolol  succinate (TOPROL -XL) 50 MG 24 hr tablet Take 1 tablet (50 mg total) by mouth daily. 90 tablet 3   montelukast (SINGULAIR) 10 MG tablet Take 10 mg by mouth at bedtime.     potassium chloride  (KLOR-CON ) 10 MEQ tablet Take 10 mEq by mouth as needed (with the fluid pill).     rosuvastatin  (CRESTOR ) 20 MG tablet Take 20 mg by mouth daily.     senna-docusate (SENOKOT-S) 8.6-50 MG tablet Take 1 tablet by mouth every morning.     spironolactone  (ALDACTONE ) 25 MG tablet Take 1/2 (one-half) tablet by mouth once daily 45 tablet 1   tirzepatide  (MOUNJARO ) 10 MG/0.5ML Pen Inject 10 mg into the skin once a week. 2 mL 0   torsemide  (DEMADEX ) 10 MG tablet Take 1 tablet (10 mg total) by mouth daily as needed (weight  gain 3 lbs overnight or 5 lbs in a week). 90 tablet 3   No current facility-administered medications on file prior to visit.   Past Medical History:  Diagnosis Date   Allergy    Anemia    Atrial fibrillation (HCC) 02/2023   on eliquis  5mg  BID   Blood transfusion without reported diagnosis    Bradycardia    Cancer (HCC)    Cardiomyopathy (HCC)    CHF (congestive heart failure) (HCC)    Chronic pain    Diabetes mellitus without complication (HCC)    GERD (gastroesophageal reflux disease)    Heart murmur    Hyperlipidemia    Migraines    Sleep apnea    Past Surgical History:  Procedure Laterality Date   ABDOMINAL HYSTERECTOMY  2016   HERNIA REPAIR     LAPAROSCOPY ABDOMEN DIAGNOSTIC     LEEP     PACEMAKER IMPLANT  2016   TUBAL LIGATION  1999      Physical Exam:   Vital Signs: BP 117/69    Pulse 78   Ht 5' 6 (1.676 m)   Wt 245 lb (111.1 kg)   BMI 39.54 kg/m  GENERAL:  well appearing, in no acute distress, alert  SKIN:  Color, texture, turgor normal. No rashes or lesions HEAD:  Normocephalic/atraumatic. RESP: normal respiratory effort MSK:  No gross joint deformities.   NEUROLOGICAL: Mental Status: Alert, oriented to person, place and time, Follows commands, and Speech fluent and appropriate. Cranial Nerves: PERRL, face symmetric, no dysarthria, hearing grossly intact Motor: moves all extremities equally Gait: normal-based.     IMPRESSION: 58 year old female with a history of DM2, HLD, OSA, CHF, bradycardia s/p pacemaker, asthma, cervical cancer 2021 who returns for follow up of migraines.  Migraines overall stable on current treatment with amitriptyline  with about 4-5/month and use of Nurtec with benefit. Currently satisfied with her current regimen. Has not been using CPAP due to increased congestion with use.    PLAN: -Prevention: continue amitriptyline  to 75 mg at bedtime (rx'd by [facility] wellness provider) -Rescue: Continue Nurtec 75 mg PRN -Next steps: consider SNRI, CGRP, qulipta for prevention  - Discussed importance of nightly use of CPAP as untreated apnea can increase headaches as well as other health related concerns.  She was advised to follow-up with the [facility] for further discussion    Follow-up in 1 year or call earlier if needed   I personally spent a total of 20 minutes in the care of the patient today including preparing to see the patient, performing a medically appropriate exam/evaluation, counseling and educating, placing orders, and documenting clinical information in the EHR.   Harlene Bogaert, AGNP-BC  [facility] [address] [place], [place] [address]  Phone [phone] Fax [phone] Note: This document was prepared with digital dictation and possible smart phrase technology. Any  transcriptional errors that result from this process are unintentional.

## 2024-06-06 ENCOUNTER — Ambulatory Visit: Payer: Self-pay | Admitting: Cardiovascular Disease

## 2024-06-06 ENCOUNTER — Encounter: Payer: Self-pay | Admitting: Adult Health

## 2024-06-06 ENCOUNTER — Ambulatory Visit (INDEPENDENT_AMBULATORY_CARE_PROVIDER_SITE_OTHER): Payer: 59 | Admitting: Adult Health

## 2024-06-06 VITALS — BP 117/69 | HR 78 | Ht 66.0 in | Wt 245.0 lb

## 2024-06-06 DIAGNOSIS — G43019 Migraine without aura, intractable, without status migrainosus: Secondary | ICD-10-CM

## 2024-06-06 MED ORDER — NURTEC 75 MG PO TBDP: 75.0000 mg | ORAL_TABLET | ORAL | 11 refills | Status: AC | PRN

## 2024-06-06 NOTE — Patient Instructions (Addendum)
 Your Plan:  Continue amitriptyline  75mg  nightly for migraine prevention   Continue Nurtec as needed     Follow up in 1 year or call earlier if needed     Thank you for coming to see us  at [facility]. I hope we have been able to provide you high quality care today.  You may receive a patient satisfaction survey over the next few weeks. We would appreciate your feedback and comments so that we may continue to improve ourselves and the health of our patients.

## 2024-06-10 DIAGNOSIS — Z0000 Encounter for general adult medical examination without abnormal findings: Secondary | ICD-10-CM | POA: Diagnosis not present

## 2024-06-10 DIAGNOSIS — E1142 Type 2 diabetes mellitus with diabetic polyneuropathy: Secondary | ICD-10-CM | POA: Diagnosis not present

## 2024-06-10 DIAGNOSIS — G4733 Obstructive sleep apnea (adult) (pediatric): Secondary | ICD-10-CM | POA: Diagnosis not present

## 2024-06-10 DIAGNOSIS — I495 Sick sinus syndrome: Secondary | ICD-10-CM | POA: Diagnosis not present

## 2024-06-10 DIAGNOSIS — E782 Mixed hyperlipidemia: Secondary | ICD-10-CM | POA: Diagnosis not present

## 2024-06-10 DIAGNOSIS — R3911 Hesitancy of micturition: Secondary | ICD-10-CM | POA: Diagnosis not present

## 2024-06-13 DIAGNOSIS — H524 Presbyopia: Secondary | ICD-10-CM | POA: Diagnosis not present

## 2024-06-13 DIAGNOSIS — E119 Type 2 diabetes mellitus without complications: Secondary | ICD-10-CM | POA: Diagnosis not present

## 2024-06-13 DIAGNOSIS — H25813 Combined forms of age-related cataract, bilateral: Secondary | ICD-10-CM | POA: Diagnosis not present

## 2024-07-06 DIAGNOSIS — R5383 Other fatigue: Secondary | ICD-10-CM | POA: Diagnosis not present

## 2024-07-06 DIAGNOSIS — R6889 Other general symptoms and signs: Secondary | ICD-10-CM | POA: Diagnosis not present

## 2024-07-06 DIAGNOSIS — Z1231 Encounter for screening mammogram for malignant neoplasm of breast: Secondary | ICD-10-CM | POA: Diagnosis not present

## 2024-07-06 DIAGNOSIS — E782 Mixed hyperlipidemia: Secondary | ICD-10-CM | POA: Diagnosis not present

## 2024-07-06 DIAGNOSIS — E1142 Type 2 diabetes mellitus with diabetic polyneuropathy: Secondary | ICD-10-CM | POA: Diagnosis not present

## 2024-07-06 DIAGNOSIS — G4733 Obstructive sleep apnea (adult) (pediatric): Secondary | ICD-10-CM | POA: Diagnosis not present

## 2024-07-06 DIAGNOSIS — E559 Vitamin D deficiency, unspecified: Secondary | ICD-10-CM | POA: Diagnosis not present

## 2024-08-05 NOTE — Progress Notes (Signed)
 Remote PPM Transmission

## 2024-08-09 ENCOUNTER — Other Ambulatory Visit: Payer: Self-pay | Admitting: Cardiovascular Disease

## 2024-08-09 NOTE — Telephone Encounter (Signed)
 Prescription refill request for Eliquis  received. Indication:afib Last office visit:2/25 Scr:0.85  1/25 Age: 58 Weight:111.1  kg  Prescription refilled

## 2024-08-23 ENCOUNTER — Ambulatory Visit: Attending: Cardiovascular Disease

## 2024-08-23 DIAGNOSIS — I495 Sick sinus syndrome: Secondary | ICD-10-CM

## 2024-08-24 LAB — CUP PACEART REMOTE DEVICE CHECK
Battery Remaining Longevity: 26 mo
Battery Remaining Percentage: 20 %
Battery Voltage: 2.9 V
Brady Statistic AP VP Percent: 1 %
Brady Statistic AP VS Percent: 1 %
Brady Statistic AS VP Percent: 1.3 %
Brady Statistic AS VS Percent: 98 %
Brady Statistic RA Percent Paced: 1 %
Brady Statistic RV Percent Paced: 1.5 %
Date Time Interrogation Session: 20251021020026
Implantable Lead Connection Status: 753985
Implantable Lead Connection Status: 753985
Implantable Lead Implant Date: 20160913
Implantable Lead Implant Date: 20160913
Implantable Lead Location: 753859
Implantable Lead Location: 753860
Implantable Lead Model: 1948
Implantable Pulse Generator Implant Date: 20160913
Lead Channel Impedance Value: 340 Ohm
Lead Channel Impedance Value: 640 Ohm
Lead Channel Pacing Threshold Amplitude: 0.75 V
Lead Channel Pacing Threshold Amplitude: 0.75 V
Lead Channel Pacing Threshold Pulse Width: 0.4 ms
Lead Channel Pacing Threshold Pulse Width: 0.4 ms
Lead Channel Sensing Intrinsic Amplitude: 2.1 mV
Lead Channel Sensing Intrinsic Amplitude: 2.7 mV
Lead Channel Setting Pacing Amplitude: 1 V
Lead Channel Setting Pacing Amplitude: 1.75 V
Lead Channel Setting Pacing Pulse Width: 0.4 ms
Lead Channel Setting Sensing Sensitivity: 0.7 mV
Pulse Gen Model: 2240
Pulse Gen Serial Number: 7676319

## 2024-08-26 NOTE — Progress Notes (Signed)
 Remote PPM Transmission

## 2024-08-29 ENCOUNTER — Ambulatory Visit: Payer: Self-pay | Admitting: Cardiovascular Disease

## 2024-11-10 ENCOUNTER — Encounter (HOSPITAL_BASED_OUTPATIENT_CLINIC_OR_DEPARTMENT_OTHER): Payer: Self-pay | Admitting: Cardiology

## 2024-11-10 NOTE — Telephone Encounter (Signed)
 Called and spoke w patient.  She said that at times she's having palpitations followed by indigestion symptoms, other times the indigestion by itself.  Currently symptom free.  Overdue for follow up.  Scheduled tomorrow morning with APP.  Pt grateful for assistance.

## 2024-11-10 NOTE — Telephone Encounter (Signed)
ER precautions provided.

## 2024-11-11 ENCOUNTER — Ambulatory Visit: Attending: Cardiology | Admitting: Cardiology

## 2024-11-11 ENCOUNTER — Encounter: Payer: Self-pay | Admitting: Cardiology

## 2024-11-11 VITALS — BP 108/68 | HR 65 | Ht 66.0 in | Wt 244.4 lb

## 2024-11-11 DIAGNOSIS — R0602 Shortness of breath: Secondary | ICD-10-CM | POA: Diagnosis not present

## 2024-11-11 DIAGNOSIS — R002 Palpitations: Secondary | ICD-10-CM

## 2024-11-11 DIAGNOSIS — I495 Sick sinus syndrome: Secondary | ICD-10-CM

## 2024-11-11 DIAGNOSIS — G4733 Obstructive sleep apnea (adult) (pediatric): Secondary | ICD-10-CM | POA: Diagnosis not present

## 2024-11-11 DIAGNOSIS — Z79899 Other long term (current) drug therapy: Secondary | ICD-10-CM

## 2024-11-11 DIAGNOSIS — I480 Paroxysmal atrial fibrillation: Secondary | ICD-10-CM | POA: Diagnosis not present

## 2024-11-11 DIAGNOSIS — I422 Other hypertrophic cardiomyopathy: Secondary | ICD-10-CM

## 2024-11-11 LAB — CBC

## 2024-11-11 NOTE — Progress Notes (Signed)
 "  Cardiology Office Note    Date:  11/11/2024  ID:  Sophia Wilkinson, DOB Oct 26, 1966, MRN [id number] PCP:  Cleotilde Planas, MD  Cardiologist:  Shelda Bruckner, MD  Electrophysiologist:  None   Chief Complaint: Palpitations   History of Present Illness: .   Sophia Wilkinson is a 59 y.o. female with visit-pertinent history of hypertrophic cardiomyopathy, paroxysmal atrial fibrillation on Eliquis , chronic diastolic heart failure, morbid obesity, obstructive sleep apnea and history of bradycardia s/p dual-chamber permanent pacemaker.  Patient was previously followed at [facility], moved to [place] in 11/2017, has history of [facility] Jude pacemaker placed in setting of bradycardia.  Patient with normal stress echo in September 2019, no inducible outflow tract gradient.  Patient was seen by Dr. Francyne in 03/2023, device interrogation indicated episodes of possible atrial fibrillation, CHADS2 Vascor of 4 and was started on Eliquis  5 mg twice daily.  Echocardiogram on 12/23/2023 indicated LVEF 65 to 70%, no intracavitary gradient seen at rest or with Valsalva, no RWMA, diastolic primaries were normal, RV systolic function and size was normal, no evidence of mitral valve regurgitation or stenosis, aortic valve regurgitation not visualized, no stenosis present, no LVH.  Patient was last seen in clinic on 12/24/2023 by Dr. Bruckner, reported intermittent palpitations and lightheadedness as well as hot flashes.  Noted chest pain randomly a few times a week that was random, nonexertional and self-limited.  Pain was described as sharp and localized to mid chest, not associate with shortness of breath.  Today patient presents for acute visit regarding increased palpitations and chest discomfort.  Patient reports that in recent weeks she has had an intermittent nagging epigastric discomfort similar to feelings of indigestion, is not associated with exertion and is self-limiting.  She notes some occasional  shortness of breath but is not always associated with her chest discomfort, patient reports that for the last few years she has had intermittent shortness of breath that fluctuates.  She denies any orthopnea or PND or recent increased lower extremity edema.  She does note in the last few weeks having more palpitations that feel as though she is having a skipped beat or intermittently decreasing heart rate, denies feelings of heart racing.  She notes her palpitations typically core more at night or in the early morning, denies any associated increased dizziness or lightheadedness.  She denies any presyncope or syncope.  She does note some slight dizziness/lightheadedness yesterday which she feels was related to not eating.  She denies any bleeding problems on her Eliquis . ROS: .   Today she denies lower extremity edema, fatigue, melena, hematuria, hemoptysis, diaphoresis, weakness, presyncope, syncope, orthopnea, and PND.  All other systems are reviewed and otherwise negative. Studies Reviewed: SABRA   EKG:  EKG is ordered today, personally reviewed, demonstrating  EKG Interpretation Date/Time:  Friday November 11 2024 08:37:20 EST Ventricular Rate:  65 PR Interval:  172 QRS Duration:  86 QT Interval:  432 QTC Calculation: 449 R Axis:   30  Text Interpretation: Normal sinus rhythm Normal ECG Confirmed by Rochelle Larue [phone]) on 11/11/2024 9:18:29 AM   CV Studies: Cardiac studies reviewed are outlined and summarized above. Otherwise please see EMR for full report. Cardiac Studies & Procedures   ______________________________________________________________________________________________   STRESS TESTS  ECHOCARDIOGRAM STRESS TEST 07/07/2018  Narrative *[facility] [address] [place], [place] [address] [phone]  ------------------------------------------------------------------- Stress Echocardiography  Patient:    Sophia Wilkinson, Sophia Wilkinson MR #:       [id number] Study Date:  07/07/2018 Gender:     F Age:        52 Height:     167.6 cm Weight:     124.1 kg BSA:        2.47 m^2 Pt. Status: Room:  ATTENDING    Maude Emmer, M.D. SONOGRAPHER  Shanda Boers, RDCS PERFORMING   [facility], Outpatient ORDERING     [place], Bridgette REFERRING    Lonni, Bridgette REFERRING    [employer]  cc:  -------------------------------------------------------------------  ------------------------------------------------------------------- Indications:      I42.1 Hypertrophic Obstructive Cardiomyopathy.  ------------------------------------------------------------------- History:   PMH:  Hypertrophic cardiomyopathy. Obstructive sleep apnea-CPAP. Bradycardia. Murmur.  Risk factors:  Diabetes mellitus. Medications:  No other medications.  ------------------------------------------------------------------- Study Conclusions  - Stress ECG conclusions: The stress ECG was normal. This score predicts a low risk of cardiac events. - Impressions: Normal stress echo  Impressions:  - Normal study after maximal exercise. Normal stress echo  ------------------------------------------------------------------- Labs, prior tests, procedures, and surgery: Permanent pacemaker system implantation.  ------------------------------------------------------------------- Study data:  Heart rate noted on post exercise images are inaccurate, due to noise. Image quality deteriorated post exercise. Study status:  Routine.  Consent:  The risks, benefits, and alternatives to the procedure were explained to the patient and informed consent was obtained.  Procedure:  The patient reported no pain pre or post test. Initial setup. The patient was brought to the laboratory. A baseline ECG was recorded. Surface ECG leads and automatic cuff blood pressure measurements were monitored. Treadmill exercise testing was performed using the Bruce protocol. The patient exercised for 6 min, to  protocol stage 2, to a maximal work rate of 7 mets. Exercise was terminated due to dyspnea and fatigue. The patient was positioned for image acquisition and recovery monitoring. Transthoracic stress echocardiography for left ventricular function evaluation. Image quality was poor. The study was technically limited due to poor acoustic window availability. Study completion:  There were no complications.          Bruce protocol. Stress echocardiography.  Birthdate:  Patient birthdate: Jan 10, 1966.  Age:  Patient is 59 yr old.  Sex:  Gender: female. BMI: 44.2 kg/m^2.  Blood pressure:     107/71  Patient status: Outpatient.  Study date:  Study date: 07/07/2018. Study time: 07:34 AM.  -------------------------------------------------------------------  ------------------------------------------------------------------- Baseline ECG:  Normal.  ------------------------------------------------------------------- Stress protocol:  +---------------------+---+-----------+---------+ !Stage                !HR !BP (mmHg)  !Symptoms ! +---------------------+---+-----------+---------+ !Baseline             !62 !107/71 (83)!None     ! +---------------------+---+-----------+---------+ !Stage 1              !116!-----------!None     ! +---------------------+---+-----------+---------+ !Stage 2              !151!-----------!Fatigue  ! +---------------------+---+-----------+---------+ !Immediate post stress!157!-----------!Subsiding! +---------------------+---+-----------+---------+ !Recovery; 1 min      !97 !-----------!None     ! +---------------------+---+-----------+---------+ !Recovery; 2 min      !56 !-----------!None     ! +---------------------+---+-----------+---------+ !Recovery; 3 min      !75 !-----------!None     ! +---------------------+---+-----------+---------+ !Recovery; 4 min      !78 !-----------!None     ! +---------------------+---+-----------+---------+ !Recovery; 5 min       !77 !-----------!None     ! +---------------------+---+-----------+---------+ !Late recovery        !76 !-----------!None     ! +---------------------+---+-----------+---------+  ------------------------------------------------------------------- Stress results:   Maximal  heart rate during stress was 157 bpm (93% of maximal predicted heart rate). The maximal predicted heart rate was 169 bpm.The target heart rate was achieved. The heart rate response to stress was normal. There was a normal resting blood pressure with an appropriate response to stress. The rate-pressure product for the peak heart rate and blood pressure was 6634 mm Hg/min.  The patient experienced no chest pain during stress. Functional capacity was normal.  ------------------------------------------------------------------- Stress ECG:   The stress ECG was normal.   This score predicts a low risk of cardiac events.  ------------------------------------------------------------------- Baseline:  - LV size was normal. - The estimated LV ejection fraction was 55%. - Normal wall motion; no LV regional wall motion abnormalities. - No evidence for new LV regional wall motion abnormalities.  Peak stress:  - LV size was reduced appropriately. - The estimated LV ejection fraction was 75%. - No evidence for new LV regional wall motion abnormalities.  ------------------------------------------------------------------- Prepared and Electronically Authenticated by  Maude Emmer, M.D. 2019-09-04T12:55:04  [facility] [id number] [place], [place] [address] [phone]  ------------------------------------------------------------------- Stress Echocardiography  (Report amended )  Patient:    Sophia Wilkinson, Sophia Wilkinson MR #:       [id number] Study Date: 07/07/2018 Gender:     F Age:        52 Height:     167.6 cm Weight:     124.1 kg BSA:        2.47 m^2 Pt. Status: Room:  ATTENDING    Maude Emmer,  M.D. SONOGRAPHER  Shanda Boers, RDCS PERFORMING   [facility], Outpatient ORDERING     [place], Bridgette REFERRING    Lonni, Bridgette REFERRING    [employer]  cc:  -------------------------------------------------------------------  ------------------------------------------------------------------- Indications:      I42.1 Hypertrophic Obstructive Cardiomyopathy.  ------------------------------------------------------------------- History:   PMH:  Hypertrophic cardiomyopathy. Obstructive sleep apnea-CPAP. Bradycardia. Murmur.  Risk factors:  Diabetes mellitus. Medications:  No other medications.  ------------------------------------------------------------------- Study Conclusions  - Baseline ECG: Baseline ECG with inferolateral T wave inversions stress ECG non diagnostic due to baseline changes. - Stress ECG conclusions: The stress ECG was normal. This score predicts a low risk of cardiac events. - Impressions: Normal stress echo  Impressions:  - Normal study after maximal exercise. Normal stress echo  ------------------------------------------------------------------- Labs, prior tests, procedures, and surgery: Permanent pacemaker system implantation.  ------------------------------------------------------------------- Study data:  Heart rate noted on post exercise images are inaccurate, due to noise. Image quality deteriorated post exercise. Study status:  Routine.  Consent:  The risks, benefits, and alternatives to the procedure were explained to the patient and informed consent was obtained.  Procedure:  The patient reported no pain pre or post test. Initial setup. The patient was brought to the laboratory. A baseline ECG was recorded. Surface ECG leads and automatic cuff blood pressure measurements were monitored. Treadmill exercise testing was performed using the Bruce protocol. The patient exercised for 6 min, to protocol stage 2, to a maximal work  rate of 7 mets. Exercise was terminated due to dyspnea and fatigue. The patient was positioned for image acquisition and recovery monitoring. Transthoracic stress echocardiography for left ventricular function evaluation. Image quality was poor. The study was technically limited due to poor acoustic window availability. Study completion:  There were no complications.          Bruce protocol. Stress echocardiography.  Birthdate:  Patient birthdate: [birth year].  Age:  Patient is 59 yr old.  Sex:  Gender: female. BMI: 44.2 kg/m^2.  Blood pressure:     107/71  Patient status: Outpatient.  Study date:  Study date: 07/07/2018. Study time: 07:34 AM.  -------------------------------------------------------------------  ------------------------------------------------------------------- Baseline ECG:  Baseline ECG with inferolateral T wave inversions stress ECG non diagnostic due to baseline changes. Normal.  ------------------------------------------------------------------- Stress protocol:  +---------------------+---+-----------+---------+ !Stage                !HR !BP (mmHg)  !Symptoms ! +---------------------+---+-----------+---------+ !Baseline             !62 !107/71 (83)!None     ! +---------------------+---+-----------+---------+ !Stage 1              !116!-----------!None     ! +---------------------+---+-----------+---------+ !Stage 2              !151!-----------!Fatigue  ! +---------------------+---+-----------+---------+ !Immediate post stress!157!-----------!Subsiding! +---------------------+---+-----------+---------+ !Recovery; 1 min      !97 !-----------!None     ! +---------------------+---+-----------+---------+ !Recovery; 2 min      !56 !-----------!None     ! +---------------------+---+-----------+---------+ !Recovery; 3 min      !75 !-----------!None     ! +---------------------+---+-----------+---------+ !Recovery; 4 min      !78 !-----------!None      ! +---------------------+---+-----------+---------+ !Recovery; 5 min      !77 !-----------!None     ! +---------------------+---+-----------+---------+ !Late recovery        !76 !-----------!None     ! +---------------------+---+-----------+---------+  ------------------------------------------------------------------- Stress results:   Maximal heart rate during stress was 157 bpm (93% of maximal predicted heart rate). The maximal predicted heart rate was 169 bpm.The target heart rate was achieved. The heart rate response to stress was normal. There was a normal resting blood pressure with an appropriate response to stress. The rate-pressure product for the peak heart rate and blood pressure was 6634 mm Hg/min.  The patient experienced no chest pain during stress. Functional capacity was normal.  ------------------------------------------------------------------- Stress ECG:   The stress ECG was normal.   This score predicts a low risk of cardiac events.  ------------------------------------------------------------------- Baseline:  - LV size was normal. - The estimated LV ejection fraction was 55%. - Normal wall motion; no LV regional wall motion abnormalities. - No evidence for new LV regional wall motion abnormalities.  Peak stress:  - LV size was reduced appropriately. - The estimated LV ejection fraction was 75%. - No evidence for new LV regional wall motion abnormalities.  ------------------------------------------------------------------- Delman Maude Emmer, M.D. 2019-09-04T12:57:58   ECHOCARDIOGRAM  ECHOCARDIOGRAM COMPLETE 12/23/2023  Narrative ECHOCARDIOGRAM REPORT    Patient Name:   Sophia Wilkinson Date of Exam: 12/23/2023 Medical Rec #:  [id number]        Height:       66.0 in Accession #:    [id number]       Weight:       246.6 lb Date of Birth:  [birth year]        BSA:          2.186 m Patient Age:    57 years         BP:           103/62  mmHg Patient Gender: F                HR:           73 bpm. Exam Location:  [address]  Procedure: 2D Echo and 3D Echo (Both Spectral and Color Flow Doppler were utilized during procedure).  Indications:    Cardiomyopathy-Unspecified I42.9  History:  Patient has no prior history of Echocardiogram examinations. Pacemaker, Arrythmias:Atrial Fibrillation; Risk Factors:Dyslipidemia and Diabetes.  Sonographer:    Augustin Seals RDCS Referring Phys: [phone] JILL D MCDANIEL   Sonographer Comments: Global longitudinal strain was attempted. IMPRESSIONS   1. No intracavitary gradient seen at rest or with valsalva. Left ventricular ejection fraction, by estimation, is 65 to 70%. Left ventricular ejection fraction by 3D volume is 64 %. The left ventricle has hyperdynamic function. The left ventricle has no regional wall motion abnormalities. Left ventricular diastolic parameters were normal. 2. Right ventricular systolic function is normal. The right ventricular size is normal. Tricuspid regurgitation signal is inadequate for assessing PA pressure. 3. The mitral valve is normal in structure. No evidence of mitral valve regurgitation. No evidence of mitral stenosis. 4. The aortic valve is normal in structure. Aortic valve regurgitation is not visualized. No aortic stenosis is present. 5. The inferior vena cava is normal in size with greater than 50% respiratory variability, suggesting right atrial pressure of 3 mmHg.  FINDINGS Left Ventricle: No intracavitary gradient seen at rest or with valsalva. Left ventricular ejection fraction, by estimation, is 65 to 70%. Left ventricular ejection fraction by 3D volume is 64 %. The left ventricle has hyperdynamic function. The left ventricle has no regional wall motion abnormalities. The left ventricular internal cavity size was normal in size. There is no left ventricular hypertrophy. Left ventricular diastolic parameters were normal.  Right  Ventricle: The right ventricular size is normal. No increase in right ventricular wall thickness. Right ventricular systolic function is normal. Tricuspid regurgitation signal is inadequate for assessing PA pressure.  Left Atrium: Left atrial size was normal in size.  Right Atrium: Right atrial size was normal in size.  Pericardium: There is no evidence of pericardial effusion.  Mitral Valve: The mitral valve is normal in structure. No evidence of mitral valve regurgitation. No evidence of mitral valve stenosis.  Tricuspid Valve: The tricuspid valve is normal in structure. Tricuspid valve regurgitation is trivial. No evidence of tricuspid stenosis.  Aortic Valve: The aortic valve is normal in structure. Aortic valve regurgitation is not visualized. No aortic stenosis is present.  Pulmonic Valve: The pulmonic valve was normal in structure. Pulmonic valve regurgitation is trivial. No evidence of pulmonic stenosis.  Aorta: The aortic root is normal in size and structure.  Pulmonary Artery: The pulmonary artery is of normal size.  Venous: The inferior vena cava is normal in size with greater than 50% respiratory variability, suggesting right atrial pressure of 3 mmHg.  IAS/Shunts: No atrial level shunt detected by color flow Doppler.  Additional Comments: 3D was performed not requiring image post processing on an independent workstation and was normal.   LEFT VENTRICLE PLAX 2D LVIDd:         4.20 cm         Diastology LVIDs:         2.40 cm         LV e' medial:    5.55 cm/s LV PW:         1.10 cm         LV E/e' medial:  15.9 LV IVS:        1.20 cm         LV e' lateral:   7.40 cm/s LVOT diam:     2.40 cm         LV E/e' lateral: 11.9 LV SV:         100 LV SV Index:  46 LVOT Area:     4.52 cm        3D Volume EF LV 3D EF:    Left ventricul ar ejection fraction by 3D volume is 64 %.  3D Volume EF: 3D EF:        64 % LV EDV:       118 ml LV ESV:       43 ml LV SV:         75 ml  RIGHT VENTRICLE             IVC RV Basal diam:  3.10 cm     IVC diam: 1.90 cm RV S prime:     14.90 cm/s TAPSE (M-mode): 2.2 cm  LEFT ATRIUM             Index        RIGHT ATRIUM           Index LA diam:        4.00 cm 1.83 cm/m   RA Area:     16.80 cm LA Vol (A2C):   80.6 ml 36.87 ml/m  RA Volume:   42.20 ml  19.30 ml/m LA Vol (A4C):   51.7 ml 23.65 ml/m LA Biplane Vol: 76.7 ml 35.09 ml/m AORTIC VALVE LVOT Vmax:   102.00 cm/s LVOT Vmean:  69.200 cm/s LVOT VTI:    0.221 m  AORTA Ao Root diam: 3.20 cm Ao Asc diam:  3.70 cm  MITRAL VALVE MV Area (PHT): 3.78 cm    SHUNTS MV Decel Time: 201 msec    Systemic VTI:  0.22 m MV E velocity: 88.07 cm/s  Systemic Diam: 2.40 cm MV A velocity: 83.17 cm/s MV E/A ratio:  1.06  Morene Brownie Electronically signed by Morene Brownie Signature Date/Time: 12/23/2023/11:02:05 AM    Final          ______________________________________________________________________________________________       Current Reported Medications:.    Active Medications[1]  Physical Exam:    VS:  BP 108/68   Pulse 65   Ht 5' 6 (1.676 m)   Wt 244 lb 6.4 oz (110.9 kg)   SpO2 98%   BMI 39.45 kg/m    Wt Readings from Last 3 Encounters:  11/11/24 244 lb 6.4 oz (110.9 kg)  06/06/24 245 lb (111.1 kg)  12/28/23 249 lb 4.8 oz (113.1 kg)    GEN: Well nourished, well developed in no acute distress NECK: No JVD; No carotid bruits CARDIAC: RRR, no murmurs, rubs, gallops RESPIRATORY:  Clear to auscultation without rales, wheezing or rhonchi  ABDOMEN: Soft, non-tender, non-distended EXTREMITIES:  No edema; No acute deformity     Asessement and Plan:.    PAF: Patient with history of PAF noted on PPM.  Patient reports increased palpitations in recent weeks typically occurring in the evening or early morning, described as a skipped beat sensation or feeling as though her heart rate is slowing.  She denies any feelings of heart racing.  EKG  today indicates normal sinus rhythm.  She denies any bleeding problems on Eliquis .  Will send message to device clinic to request interrogation of PPM, is also overdue for follow-up with Dr. Francyne, request an office visit for device today.  Check CBC, c-Met, magnesium and TSH with reflex.  Encourage patient to stay hydrated and use her CPAP device.  Continue Eliquis  5 mg twice daily and metoprolol  succinate 50 mg daily.  SSS: Patient with history of bradycardia s/p PPM followed by Dr.  Croitoru.  Last interrogation 08/2024 presenting rhythm atrial sensed, ventricular sensed, normal sinus rhythm.  Lead measurements were stable, heart rate histograms were favorable.  No clinically significant episodes of high ventricular rate or atrial mode switch noted. Patient has noted some increased palpitations, will request device interrogation as noted above.  HCM: Echocardiogram on 12/23/2023 indicated LVEF 65 to 70%, no intracavitary gradient seen at rest or with Valsalva, no RWMA, diastolic primaries were normal, RV systolic function and size was normal, no evidence of mitral valve regurgitation or stenosis, aortic valve regurgitation not visualized, no stenosis present, no LVH.  Patient notes that she has long history of intermittent shortness of breath, does note some increased dyspnea on exertion in recent weeks, denies any orthopnea or PND.  She denies any increased lower extremity edema.  She denies any presyncope or syncope, does note she had some slight dizziness yesterday which she felt was related to decreased intake.  Murmur not appreciated with Valsalva.  She appears euvolemic and well compensated.  Encouraged to stay well-hydrated.  Check echocardiogram.  Reviewed ED precautions.  Atypical chest pain: Patient with history of atypical chest pain, today she reports an intermittent feeling of indigestion in the epigastric region, notes she does have history of acid reflux.  She denies any chest pain on  exertion, does note some shortness of breath which she reports she has intermittently had previously.  EKG today reassuring, discussed stress testing, patient deferred at this time in favor of echocardiogram and lab work, candidate discuss further at follow-up visit.  OSA: Patient notes history of OSA, reports she has not been using her CPAP recently, encouraged use as this could be contributing to her shortness of breath and increased palpitations.   Disposition: F/u with Berdina Cheever, NP in 6-8 weeks.   Signed, Shota Kohrs D Lisha Vitale, NP       [1]  Current Meds  Medication Sig   acetaminophen (TYLENOL) 650 MG CR tablet Take 650 mg by mouth 2 (two) times daily.   albuterol  (VENTOLIN  HFA) 108 (90 Base) MCG/ACT inhaler Inhale 2 puffs into the lungs every 4 (four) hours as needed for wheezing or shortness of breath.   amitriptyline  (ELAVIL ) 25 MG tablet Take 75 mg by mouth at bedtime.   apixaban  (ELIQUIS ) 5 MG TABS tablet Take 1 tablet by mouth twice daily   candesartan  (ATACAND ) 4 MG tablet Take 0.5 tablets (2 mg total) by mouth daily. Please contact PCP for subsequent refills.   chlorpheniramine (CHLOR-TRIMETON) 4 MG tablet Take 4 mg by mouth 2 (two) times daily as needed for allergies.   diclofenac  Sodium (VOLTAREN ) 1 % GEL Apply 2 g topically as needed.   famotidine (PEPCID) 20 MG tablet Take 20 mg by mouth daily.   gabapentin (NEURONTIN) 300 MG capsule Take 300 mg by mouth 3 (three) times daily.   metoprolol  succinate (TOPROL -XL) 50 MG 24 hr tablet Take 1 tablet (50 mg total) by mouth daily.   montelukast (SINGULAIR) 10 MG tablet Take 10 mg by mouth at bedtime.   potassium chloride  (KLOR-CON ) 10 MEQ tablet Take 10 mEq by mouth as needed (with the fluid pill).   Rimegepant Sulfate (NURTEC) 75 MG TBDP Take 1 tablet (75 mg total) by mouth as needed (for migraine). Max dose 1 pill in 24 hours   rosuvastatin  (CRESTOR ) 20 MG tablet Take 20 mg by mouth daily.   senna-docusate (SENOKOT-S) 8.6-50 MG  tablet Take 1 tablet by mouth every morning.   spironolactone  (ALDACTONE ) 25 MG tablet Take  1/2 (one-half) tablet by mouth once daily   tirzepatide  (MOUNJARO ) 10 MG/0.5ML Pen Inject 10 mg into the skin once a week.   torsemide  (DEMADEX ) 10 MG tablet Take 1 tablet (10 mg total) by mouth daily as needed (weight gain 3 lbs overnight or 5 lbs in a week).   "

## 2024-11-11 NOTE — Patient Instructions (Signed)
 Medication Instructions:  Your physician recommends that you continue on your current medications as directed. Please refer to the Current Medication list given to you today.  *If you need a refill on your cardiac medications before your next appointment, please call your pharmacy*  Lab Work: TODAY: CMET, Magnesium, CBC, TSH + FT4/FT3 If you have labs (blood work) drawn today and your tests are completely normal, you will receive your results only by: MyChart Message (if you have MyChart) OR A paper copy in the mail If you have any lab test that is abnormal or we need to change your treatment, we will call you to review the results.  Testing/Procedures: Your physician has requested that you have an echocardiogram. Echocardiography is a painless test that uses sound waves to create images of your heart. It provides your doctor with information about the size and shape of your heart and how well your hearts chambers and valves are working. This procedure takes approximately one hour. There are no restrictions for this procedure. Please do NOT wear cologne, perfume, aftershave, or lotions (deodorant is allowed). Please arrive 15 minutes prior to your appointment time.  Please note: We ask at that you not bring children with you during ultrasound (echo/ vascular) testing. Due to room size and safety concerns, children are not allowed in the ultrasound rooms during exams. Our front office staff cannot provide observation of children in our lobby area while testing is being conducted. An adult accompanying a patient to their appointment will only be allowed in the ultrasound room at the discretion of the ultrasound technician under special circumstances. We apologize for any inconvenience.   Follow-Up: At [facility], you and your health needs are our priority.  As part of our continuing mission to provide you with exceptional heart care, our providers are all part of one team.  This team  includes your primary Cardiologist (physician) and Advanced Practice Providers or APPs (Physician Assistants and Nurse Practitioners) who all work together to provide you with the care you need, when you need it.  Your next appointment:   6-8 week(s)  Provider:   Shelda Bruckner, MD or Katlyn West, NP  NEEDS next available device appointment with Dr. Francyne.

## 2024-11-12 ENCOUNTER — Ambulatory Visit

## 2024-11-12 LAB — COMPREHENSIVE METABOLIC PANEL WITH GFR
ALT: 31 IU/L (ref 0–32)
AST: 19 IU/L (ref 0–40)
Albumin: 4.2 g/dL (ref 3.8–4.9)
Alkaline Phosphatase: 79 IU/L (ref 49–135)
BUN/Creatinine Ratio: 19 (ref 9–23)
BUN: 14 mg/dL (ref 6–24)
Bilirubin Total: 0.7 mg/dL (ref 0.0–1.2)
CO2: 24 mmol/L (ref 20–29)
Calcium: 10.4 mg/dL — ABNORMAL HIGH (ref 8.7–10.2)
Chloride: 104 mmol/L (ref 96–106)
Creatinine, Ser: 0.73 mg/dL (ref 0.57–1.00)
Globulin, Total: 2.9 g/dL (ref 1.5–4.5)
Glucose: 93 mg/dL (ref 70–99)
Potassium: 4.6 mmol/L (ref 3.5–5.2)
Sodium: 143 mmol/L (ref 134–144)
Total Protein: 7.1 g/dL (ref 6.0–8.5)
eGFR: 96 mL/min/1.73

## 2024-11-12 LAB — MAGNESIUM: Magnesium: 1.9 mg/dL (ref 1.6–2.3)

## 2024-11-12 LAB — CBC
Hematocrit: 42.6 % (ref 34.0–46.6)
Hemoglobin: 13.4 g/dL (ref 11.1–15.9)
MCH: 27.9 pg (ref 26.6–33.0)
MCHC: 31.5 g/dL (ref 31.5–35.7)
MCV: 89 fL (ref 79–97)
Platelets: 363 x10E3/uL (ref 150–450)
RBC: 4.81 x10E6/uL (ref 3.77–5.28)
RDW: 13.1 % (ref 11.7–15.4)
WBC: 6.5 x10E3/uL (ref 3.4–10.8)

## 2024-11-12 LAB — TSH+T4F+T3FREE
Free T4: 1.18 ng/dL (ref 0.82–1.77)
T3, Free: 3.2 pg/mL (ref 2.0–4.4)
TSH: 1.51 u[IU]/mL (ref 0.450–4.500)

## 2024-11-13 ENCOUNTER — Ambulatory Visit: Attending: Family Medicine

## 2024-11-13 ENCOUNTER — Ambulatory Visit: Payer: Self-pay | Admitting: Cardiology

## 2024-11-14 ENCOUNTER — Telehealth: Payer: Self-pay

## 2024-11-14 NOTE — Telephone Encounter (Signed)
 Patient is past due for annual PPM follow up with Dr. Francyne.  Forwarding to EP scheduling team to get patient set up in near future.

## 2024-11-22 ENCOUNTER — Ambulatory Visit

## 2024-11-22 DIAGNOSIS — I495 Sick sinus syndrome: Secondary | ICD-10-CM | POA: Diagnosis not present

## 2024-11-23 LAB — CUP PACEART REMOTE DEVICE CHECK
Battery Remaining Longevity: 24 mo
Battery Remaining Percentage: 18 %
Battery Voltage: 2.89 V
Brady Statistic AP VP Percent: 1 %
Brady Statistic AP VS Percent: 1 %
Brady Statistic AS VP Percent: 1.3 %
Brady Statistic AS VS Percent: 98 %
Brady Statistic RA Percent Paced: 1 %
Brady Statistic RV Percent Paced: 1.4 %
Date Time Interrogation Session: 20260120020016
Implantable Lead Connection Status: 753985
Implantable Lead Connection Status: 753985
Implantable Lead Implant Date: 20160913
Implantable Lead Implant Date: 20160913
Implantable Lead Location: 753859
Implantable Lead Location: 753860
Implantable Lead Model: 1948
Implantable Pulse Generator Implant Date: 20160913
Lead Channel Impedance Value: 340 Ohm
Lead Channel Impedance Value: 590 Ohm
Lead Channel Pacing Threshold Amplitude: 0.75 V
Lead Channel Pacing Threshold Amplitude: 1.25 V
Lead Channel Pacing Threshold Pulse Width: 0.4 ms
Lead Channel Pacing Threshold Pulse Width: 0.4 ms
Lead Channel Sensing Intrinsic Amplitude: 2.2 mV
Lead Channel Sensing Intrinsic Amplitude: 3 mV
Lead Channel Setting Pacing Amplitude: 1.5 V
Lead Channel Setting Pacing Amplitude: 1.75 V
Lead Channel Setting Pacing Pulse Width: 0.4 ms
Lead Channel Setting Sensing Sensitivity: 0.7 mV
Pulse Gen Model: 2240
Pulse Gen Serial Number: 7676319

## 2024-11-25 NOTE — Progress Notes (Signed)
 Remote PPM Transmission

## 2024-11-27 ENCOUNTER — Ambulatory Visit: Payer: Self-pay | Admitting: Cardiovascular Disease

## 2024-12-23 ENCOUNTER — Ambulatory Visit (HOSPITAL_COMMUNITY)

## 2024-12-26 ENCOUNTER — Ambulatory Visit: Admitting: Cardiology

## 2025-02-21 ENCOUNTER — Ambulatory Visit

## 2025-05-23 ENCOUNTER — Ambulatory Visit

## 2025-06-12 ENCOUNTER — Ambulatory Visit: Admitting: Adult Health

## 2025-08-22 ENCOUNTER — Ambulatory Visit

## 2025-11-21 ENCOUNTER — Ambulatory Visit

## 2026-02-20 ENCOUNTER — Ambulatory Visit

## 2026-05-22 ENCOUNTER — Ambulatory Visit
